# Patient Record
Sex: Male | Born: 1940 | Race: White | Hispanic: No | Smoking: Former smoker
Health system: Southern US, Community
[De-identification: ages and names within clinical notes are randomized; demographics above are authoritative.]

## PROBLEM LIST (undated history)

## (undated) DIAGNOSIS — M1990 Unspecified osteoarthritis, unspecified site: Secondary | ICD-10-CM

## (undated) DIAGNOSIS — Z85828 Personal history of other malignant neoplasm of skin: Secondary | ICD-10-CM

## (undated) DIAGNOSIS — N200 Calculus of kidney: Secondary | ICD-10-CM

## (undated) DIAGNOSIS — C801 Malignant (primary) neoplasm, unspecified: Secondary | ICD-10-CM

## (undated) DIAGNOSIS — R7303 Prediabetes: Secondary | ICD-10-CM

## (undated) DIAGNOSIS — K219 Gastro-esophageal reflux disease without esophagitis: Secondary | ICD-10-CM

## (undated) DIAGNOSIS — E782 Mixed hyperlipidemia: Secondary | ICD-10-CM

## (undated) DIAGNOSIS — E559 Vitamin D deficiency, unspecified: Secondary | ICD-10-CM

## (undated) DIAGNOSIS — H8110 Benign paroxysmal vertigo, unspecified ear: Secondary | ICD-10-CM

## (undated) DIAGNOSIS — E1121 Type 2 diabetes mellitus with diabetic nephropathy: Secondary | ICD-10-CM

## (undated) HISTORY — DX: Mixed hyperlipidemia: E78.2

## (undated) HISTORY — DX: Vitamin D deficiency, unspecified: E55.9

## (undated) HISTORY — DX: Unspecified osteoarthritis, unspecified site: M19.90

## (undated) HISTORY — DX: Gastro-esophageal reflux disease without esophagitis: K21.9

## (undated) HISTORY — DX: Benign paroxysmal vertigo, unspecified ear: H81.10

## (undated) HISTORY — DX: Personal history of other malignant neoplasm of skin: Z85.828

## (undated) HISTORY — DX: Prediabetes: R73.03

## (undated) HISTORY — DX: Type 2 diabetes mellitus with diabetic nephropathy: E11.21

---

## 1995-08-08 HISTORY — PX: TEMPORAL ARTERY BIOPSY / LIGATION: SUR132

## 2000-05-09 ENCOUNTER — Ambulatory Visit (HOSPITAL_COMMUNITY): Admission: RE | Admit: 2000-05-09 | Discharge: 2000-05-09 | Payer: Self-pay | Admitting: Vascular Surgery

## 2006-08-07 HISTORY — PX: PROSTATECTOMY: SHX69

## 2008-08-07 HISTORY — PX: TRANSURETHRAL RESECTION OF PROSTATE: SHX73

## 2010-08-07 HISTORY — PX: EYE SURGERY: SHX253

## 2013-08-07 DIAGNOSIS — C801 Malignant (primary) neoplasm, unspecified: Secondary | ICD-10-CM | POA: Insufficient documentation

## 2013-08-07 HISTORY — DX: Malignant (primary) neoplasm, unspecified: C80.1

## 2013-11-25 DIAGNOSIS — Z961 Presence of intraocular lens: Secondary | ICD-10-CM | POA: Diagnosis not present

## 2015-08-08 DIAGNOSIS — J0100 Acute maxillary sinusitis, unspecified: Secondary | ICD-10-CM | POA: Diagnosis not present

## 2015-08-08 DIAGNOSIS — R05 Cough: Secondary | ICD-10-CM | POA: Diagnosis not present

## 2015-08-31 DIAGNOSIS — J069 Acute upper respiratory infection, unspecified: Secondary | ICD-10-CM | POA: Diagnosis not present

## 2015-08-31 DIAGNOSIS — J209 Acute bronchitis, unspecified: Secondary | ICD-10-CM | POA: Diagnosis not present

## 2015-09-23 DIAGNOSIS — E782 Mixed hyperlipidemia: Secondary | ICD-10-CM | POA: Diagnosis not present

## 2015-09-23 DIAGNOSIS — N3941 Urge incontinence: Secondary | ICD-10-CM | POA: Diagnosis not present

## 2015-09-23 DIAGNOSIS — R5383 Other fatigue: Secondary | ICD-10-CM | POA: Diagnosis not present

## 2015-09-23 DIAGNOSIS — K219 Gastro-esophageal reflux disease without esophagitis: Secondary | ICD-10-CM | POA: Diagnosis not present

## 2015-10-08 DIAGNOSIS — J00 Acute nasopharyngitis [common cold]: Secondary | ICD-10-CM | POA: Diagnosis not present

## 2015-10-08 DIAGNOSIS — M791 Myalgia: Secondary | ICD-10-CM | POA: Diagnosis not present

## 2015-10-11 DIAGNOSIS — N3941 Urge incontinence: Secondary | ICD-10-CM | POA: Diagnosis not present

## 2015-10-11 DIAGNOSIS — J208 Acute bronchitis due to other specified organisms: Secondary | ICD-10-CM | POA: Diagnosis not present

## 2015-12-23 DIAGNOSIS — J208 Acute bronchitis due to other specified organisms: Secondary | ICD-10-CM | POA: Diagnosis not present

## 2015-12-25 DIAGNOSIS — N201 Calculus of ureter: Secondary | ICD-10-CM | POA: Diagnosis not present

## 2015-12-25 DIAGNOSIS — R319 Hematuria, unspecified: Secondary | ICD-10-CM | POA: Diagnosis not present

## 2015-12-25 DIAGNOSIS — R1030 Lower abdominal pain, unspecified: Secondary | ICD-10-CM | POA: Diagnosis not present

## 2015-12-25 DIAGNOSIS — I10 Essential (primary) hypertension: Secondary | ICD-10-CM | POA: Diagnosis not present

## 2015-12-29 DIAGNOSIS — N200 Calculus of kidney: Secondary | ICD-10-CM | POA: Diagnosis not present

## 2015-12-29 DIAGNOSIS — J208 Acute bronchitis due to other specified organisms: Secondary | ICD-10-CM | POA: Diagnosis not present

## 2016-01-06 ENCOUNTER — Other Ambulatory Visit: Payer: Self-pay | Admitting: Urology

## 2016-01-06 ENCOUNTER — Encounter (HOSPITAL_COMMUNITY): Payer: Self-pay

## 2016-01-06 DIAGNOSIS — N281 Cyst of kidney, acquired: Secondary | ICD-10-CM | POA: Diagnosis not present

## 2016-01-06 DIAGNOSIS — N202 Calculus of kidney with calculus of ureter: Secondary | ICD-10-CM | POA: Diagnosis not present

## 2016-01-09 DIAGNOSIS — S0502XA Injury of conjunctiva and corneal abrasion without foreign body, left eye, initial encounter: Secondary | ICD-10-CM | POA: Diagnosis not present

## 2016-01-10 ENCOUNTER — Encounter (HOSPITAL_COMMUNITY)
Admission: RE | Disposition: A | Discharge status: Home / Self Care | Payer: Self-pay | Source: Ambulatory Visit | Attending: Urology

## 2016-01-10 ENCOUNTER — Encounter (HOSPITAL_COMMUNITY): Payer: Self-pay

## 2016-01-10 ENCOUNTER — Ambulatory Visit (HOSPITAL_COMMUNITY)
Admission: RE | Admit: 2016-01-10 | Discharge: 2016-01-10 | Disposition: A | Discharge status: Home / Self Care | Payer: Medicare HMO | Source: Ambulatory Visit | Attending: Urology | Admitting: Urology

## 2016-01-10 ENCOUNTER — Ambulatory Visit (HOSPITAL_COMMUNITY): Payer: Medicare HMO

## 2016-01-10 DIAGNOSIS — M1990 Unspecified osteoarthritis, unspecified site: Secondary | ICD-10-CM | POA: Diagnosis not present

## 2016-01-10 DIAGNOSIS — Z8711 Personal history of peptic ulcer disease: Secondary | ICD-10-CM | POA: Diagnosis not present

## 2016-01-10 DIAGNOSIS — Z79899 Other long term (current) drug therapy: Secondary | ICD-10-CM | POA: Insufficient documentation

## 2016-01-10 DIAGNOSIS — N281 Cyst of kidney, acquired: Secondary | ICD-10-CM | POA: Insufficient documentation

## 2016-01-10 DIAGNOSIS — N201 Calculus of ureter: Secondary | ICD-10-CM

## 2016-01-10 DIAGNOSIS — Z8051 Family history of malignant neoplasm of kidney: Secondary | ICD-10-CM | POA: Diagnosis not present

## 2016-01-10 DIAGNOSIS — Z7982 Long term (current) use of aspirin: Secondary | ICD-10-CM | POA: Diagnosis not present

## 2016-01-10 DIAGNOSIS — Z87442 Personal history of urinary calculi: Secondary | ICD-10-CM | POA: Insufficient documentation

## 2016-01-10 DIAGNOSIS — Z79891 Long term (current) use of opiate analgesic: Secondary | ICD-10-CM | POA: Insufficient documentation

## 2016-01-10 DIAGNOSIS — N200 Calculus of kidney: Secondary | ICD-10-CM | POA: Diagnosis not present

## 2016-01-10 DIAGNOSIS — Z87891 Personal history of nicotine dependence: Secondary | ICD-10-CM | POA: Diagnosis not present

## 2016-01-10 DIAGNOSIS — Z01818 Encounter for other preprocedural examination: Secondary | ICD-10-CM | POA: Diagnosis not present

## 2016-01-10 HISTORY — DX: Calculus of kidney: N20.0

## 2016-01-10 HISTORY — DX: Malignant (primary) neoplasm, unspecified: C80.1

## 2016-01-10 SURGERY — LITHOTRIPSY, ESWL
Anesthesia: LOCAL | Laterality: Left

## 2016-01-10 MED ORDER — OXYCODONE HCL 10 MG PO TABS: 10.0000 mg | ORAL_TABLET | ORAL | Status: DC | PRN

## 2016-01-10 MED ORDER — DIAZEPAM 5 MG PO TABS
10.0000 mg | ORAL_TABLET | ORAL | Status: AC
  Administered 2016-01-10: 10 mg via ORAL
  Filled 2016-01-10: qty 2

## 2016-01-10 MED ORDER — CIPROFLOXACIN HCL 500 MG PO TABS
500.0000 mg | ORAL_TABLET | ORAL | Status: AC
  Administered 2016-01-10: 500 mg via ORAL
  Filled 2016-01-10: qty 1

## 2016-01-10 MED ORDER — SODIUM CHLORIDE 0.9 % IV SOLN
INTRAVENOUS | Status: DC
  Administered 2016-01-10: 08:00:00 via INTRAVENOUS

## 2016-01-10 MED ORDER — TAMSULOSIN HCL 0.4 MG PO CAPS: 0.4000 mg | ORAL_CAPSULE | ORAL | Status: DC

## 2016-01-10 MED ORDER — TAMSULOSIN HCL 0.4 MG PO CAPS
0.4000 mg | ORAL_CAPSULE | Freq: Once | ORAL | Status: AC
  Administered 2016-01-10: 0.4 mg via ORAL
  Filled 2016-01-10: qty 1

## 2016-01-10 MED ORDER — DIPHENHYDRAMINE HCL 25 MG PO CAPS
25.0000 mg | ORAL_CAPSULE | ORAL | Status: AC
  Administered 2016-01-10: 25 mg via ORAL
  Filled 2016-01-10: qty 1

## 2016-01-10 NOTE — H&P (Signed)
History of Present Illness [name] is a 75 yo WM who was sent in consultation from [facility] for a left proximal stone. He originally presented with gross hematuria about 12 days ago.  He then had lower abdominal and back pain with nausea. The pain was severe and he went to the ER at [facility] and had a CT that showed a 19m left proximal stone with mild hydro and a 749mLLP stone. He has an 8cm right renal cyst.  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  He is currently on flomax for the stone and was taken off of the tropsium and his incontinence is back. He had a TURP remotely.  He continues to have some nausea. and LLQ pain. He has no worsening of his urgency.  He has no prior history of stones.   Past Medical History Problems  1. History of arthritis (Z87.39) 2. History of gastric ulcer (Z87.19) 3. History of hypercholesterolemia (Z86.39) 4. History of renal calculi (Z(Z61.096 Surgical History Problems  1. History of Biopsy Temporal Artery 2. History of Prostate Surgery 3. History of Transurethral Resection Of Prostate (TURP)  Current Meds 1. Acidophilus TABS;  Therapy: (Recorded:01Jun2017) to Recorded 2. Amoxicillin TABS;  Therapy: (Recorded:01Jun2017) to Recorded 3. Aspirin 325 MG Oral Tablet;  Therapy: (Recorded:01Jun2017) to Recorded 4. Benzonatate 200 MG Oral Capsule;  Therapy: (Recorded:01Jun2017) to Recorded 5. Hydrocodone-Acetaminophen TABS;  Therapy: (Recorded:01Jun2017) to Recorded 6. Omeprazole 20 MG Oral Capsule Delayed Release;  Therapy: (Recorded:01Jun2017) to Recorded 7. Tamsulosin HCl - 0.4 MG Oral Capsule;  Therapy: (Recorded:01Jun2017) to Recorded  Allergies Medication  1. No Known Drug Allergies  Family History Problems  1. Family history of Brain tumor : Mother 2. 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  Genitourinary: urinary frequency, feelings of urinary urgency, nocturia, incontinence, hematuria and erectile dysfunction.  Gastrointestinal: nausea, diarrhea and constipation.  Constitutional: night sweats, feeling tired (fatigue) and recent weight loss.  ENT: sore throat and sinus problems.  Respiratory: cough.  Neurological: headache and dizziness.    Vitals Vital Signs   Height: 5 ft 11 in Weight: 230 lb  BMI Calculated: 32.08 BSA Calculated: 2.24 Blood Pressure: 159 / 79 Temperature: 98.4 F Heart Rate: 72  Physical Exam Constitutional: Well nourished and well developed . No acute distress.  ENT:. The ears and nose are normal in appearance.  Neck: The appearance of the neck is normal and no neck mass is present.  Pulmonary: No respiratory distress and normal respiratory rhythm and effort.  Cardiovascular: Heart rate and rhythm are normal . No peripheral edema.  Abdomen: The abdomen is obese. No masses are palpated. Mild tenderness in the LLQ is present. No CVA tenderness. No hernias are palpable. No hepatosplenomegaly noted.  Lymphatics: The posterior cervical and supraclavicular nodes are not enlarged or tender.  Skin: Normal skin turgor, no visible rash and no visible skin lesions.  Neuro/Psych:. Mood and affect are appropriate.    Results/Data  KUB for follow up of his left ureteral stone shows the stone adjacent to the L3-4 interspace. It measures about 2m x 416m There is a 1060mLP stone. He has degenerative lumbar changes but no gas or soft  tissue abnormalities.    [facility].  The following images/tracing/specimen were independently visualized:  CT and KUB films reviewed.  The following clinical lab reports were reviewed:  UA reviewed.  The following radiology reports were reviewed: CT films reviewed.    Assessment   He has non-progression of the left proximal stone with persistent pain and nausea.  He also has a 52m28mP stone and a large simple RLP cyst.   Plan   I discussed the treatment options including MET, ESWL, ureteroscopy and PCNL and have recommended an initial approach with ESWL since the stone has not moved but has a density that is amenable to ESWL. I reviewed the risks of bleeding, infection, injury possibly severe to the kidney or adjacent organs, failure of fragmentation, need for secondary procedures for obstructing fragments, cardiac arrhythmia and sedation complications.    He will be set up for next Monday if possible.

## 2016-01-10 NOTE — Discharge Instructions (Signed)
Lithotripsy, Care After °Refer to this sheet in the next few weeks. These instructions provide you with information on caring for yourself after your procedure. Your health care provider may also give you more specific instructions. Your treatment has been planned according to current medical practices, but problems sometimes occur. Call your health care provider if you have any problems or questions after your procedure. °WHAT TO EXPECT AFTER THE PROCEDURE  °· Your urine may have a red tinge for a few days after treatment. Blood loss is usually minimal. °· You may have soreness in the back or flank area. This usually goes away after a few days. The procedure can cause blotches or bruises on the back where the pressure wave enters the skin. These marks usually cause only minimal discomfort and should disappear in a short time. °· Stone fragments should begin to pass within 24 hours of treatment. However, a delayed passage is not unusual. °· You may have pain, discomfort, and feel sick to your stomach (nauseated) when the crushed fragments of stone are passed down the tube from the kidney to the bladder. Stone fragments can pass soon after the procedure and may last for up to 4-8 weeks. °· A small number of patients may have severe pain when stone fragments are not able to pass, which leads to an obstruction. °· If your stone is greater than 1 inch (2.5 cm) in diameter or if you have multiple stones that have a combined diameter greater than 1 inch (2.5 cm), you may require more than one treatment. °· If you had a stent placed prior to your procedure, you may experience some discomfort, especially during urination. You may experience the pain or discomfort in your flank or back, or you may experience a sharp pain or discomfort at the base of your penis or in your lower abdomen. The discomfort usually lasts only a few minutes after urinating. °HOME CARE INSTRUCTIONS  °· Rest at home until you feel your energy  improving. °· Only take over-the-counter or prescription medicines for pain, discomfort, or fever as directed by your health care provider. Depending on the type of lithotripsy, you may need to take antibiotics and anti-inflammatory medicines for a few days. °· Drink enough water and fluids to keep your urine clear or pale yellow. This helps "flush" your kidneys. It helps pass any remaining pieces of stone and prevents stones from coming back. °· Most people can resume daily activities within 1-2 days after standard lithotripsy. It can take longer to recover from laser and percutaneous lithotripsy. °· Strain all urine through the provided strainer. Keep all particulate matter and stones for your health care provider to see. The stone may be as small as a grain of salt. It is very important to use the strainer each and every time you pass your urine. Any stones that are found can be sent to a medical lab for examination. °· Visit your health care provider for a follow-up appointment in a few weeks. Your doctor may remove your stent if you have one. Your health care provider will also check to see whether stone particles still remain. °SEEK MEDICAL CARE IF:  °· Your pain is not relieved by medicine. °· You have a lasting nauseous feeling. °· You feel there is too much blood in the urine. °· You develop persistent problems with frequent or painful urination that does not at least partially improve after 2 days following the procedure. °· You have a congested cough. °· You feel   lightheaded. °· You develop a rash or any other signs that might suggest an allergic problem. °· You develop any reaction or side effects to your medicine(s). °SEEK IMMEDIATE MEDICAL CARE IF:  °· You experience severe back or flank pain or both. °· You see nothing but blood when you urinate. °· You cannot pass any urine at all. °· You have a fever or shaking chills. °· You develop shortness of breath, difficulty breathing, or chest pain. °· You  develop vomiting that will not stop after 6-8 hours. °· You have a fainting episode. °  °This information is not intended to replace advice given to you by your health care provider. Make sure you discuss any questions you have with your health care provider. °  °Document Released: 08/13/2007 Document Revised: 04/14/2015 Document Reviewed: 02/06/2013 °[employer]vier Interactive Patient Education ©2016 [employer]    ° ° ° °                                                                                                           Moderate Conscious Sedation, Adult, Care After °Refer to this sheet in the next few weeks. These instructions provide you with information on caring for yourself after your procedure. Your health care provider may also give you more specific instructions. Your treatment has been planned according to current medical practices, but problems sometimes occur. Call your health care provider if you have any problems or questions after your procedure. °WHAT TO EXPECT AFTER THE PROCEDURE  °After your procedure: °· You may feel sleepy, clumsy, and have poor balance for several hours. °· Vomiting may occur if you eat too soon after the procedure. °HOME CARE INSTRUCTIONS °· Do not participate in any activities where you could become injured for at least 24 hours. Do not: °¨ Drive. °¨ Swim. °¨ Ride a bicycle. °¨ Operate heavy machinery. °¨ Cook. °¨ Use power tools. °¨ Climb ladders. °¨ Work from a high place. °· Do not make important decisions or sign legal documents until you are improved. °· If you vomit, drink water, juice, or soup when you can drink without vomiting. Make sure you have little or no nausea before eating solid foods. °· Only take over-the-counter or prescription medicines for pain, discomfort, or fever as directed by your health care provider. °· Make sure you and your family fully understand everything about the medicines given to you, including what side effects may occur. °· You should  not drink alcohol, take sleeping pills, or take medicines that cause drowsiness for at least 24 hours. °· If you smoke, do not smoke without supervision. °· If you are feeling better, you may resume normal activities 24 hours after you were sedated. °· Keep all appointments with your health care provider. °SEEK MEDICAL CARE IF: °· Your skin is pale or bluish in color. °· You continue to feel nauseous or vomit. °· Your pain is getting worse and is not helped by medicine. °· You have bleeding or swelling. °· You are still sleepy or feeling clumsy after 24 hours. °SEEK

## 2016-01-10 NOTE — Op Note (Signed)
See [facility] OP note scanned into chart. Also because of the size, density, location and other factors that cannot be anticipated I feel this will likely be a staged procedure. This fact supersedes any indication in the scanned [facility] operative note to the contrary.  

## 2016-01-18 DIAGNOSIS — J208 Acute bronchitis due to other specified organisms: Secondary | ICD-10-CM | POA: Diagnosis not present

## 2016-02-01 DIAGNOSIS — R3915 Urgency of urination: Secondary | ICD-10-CM | POA: Diagnosis not present

## 2016-02-01 DIAGNOSIS — N200 Calculus of kidney: Secondary | ICD-10-CM | POA: Diagnosis not present

## 2016-02-02 DIAGNOSIS — J441 Chronic obstructive pulmonary disease with (acute) exacerbation: Secondary | ICD-10-CM | POA: Diagnosis not present

## 2016-02-02 DIAGNOSIS — J209 Acute bronchitis, unspecified: Secondary | ICD-10-CM | POA: Diagnosis not present

## 2016-02-02 DIAGNOSIS — R05 Cough: Secondary | ICD-10-CM | POA: Diagnosis not present

## 2016-02-02 DIAGNOSIS — N200 Calculus of kidney: Secondary | ICD-10-CM | POA: Diagnosis not present

## 2016-02-16 DIAGNOSIS — J410 Simple chronic bronchitis: Secondary | ICD-10-CM | POA: Diagnosis not present

## 2016-02-16 DIAGNOSIS — N3941 Urge incontinence: Secondary | ICD-10-CM | POA: Diagnosis not present

## 2016-03-05 DIAGNOSIS — S86111A Strain of other muscle(s) and tendon(s) of posterior muscle group at lower leg level, right leg, initial encounter: Secondary | ICD-10-CM | POA: Diagnosis not present

## 2016-03-05 DIAGNOSIS — M79662 Pain in left lower leg: Secondary | ICD-10-CM | POA: Diagnosis not present

## 2016-03-05 DIAGNOSIS — M79661 Pain in right lower leg: Secondary | ICD-10-CM | POA: Diagnosis not present

## 2016-03-05 DIAGNOSIS — X58XXXA Exposure to other specified factors, initial encounter: Secondary | ICD-10-CM | POA: Diagnosis not present

## 2016-03-05 DIAGNOSIS — M79606 Pain in leg, unspecified: Secondary | ICD-10-CM | POA: Diagnosis not present

## 2016-03-05 DIAGNOSIS — T148 Other injury of unspecified body region: Secondary | ICD-10-CM | POA: Diagnosis not present

## 2016-03-19 DIAGNOSIS — A881 Epidemic vertigo: Secondary | ICD-10-CM | POA: Diagnosis not present

## 2016-03-22 DIAGNOSIS — Z125 Encounter for screening for malignant neoplasm of prostate: Secondary | ICD-10-CM | POA: Diagnosis not present

## 2016-03-22 DIAGNOSIS — Z23 Encounter for immunization: Secondary | ICD-10-CM | POA: Diagnosis not present

## 2016-03-22 DIAGNOSIS — Z6835 Body mass index (BMI) 35.0-35.9, adult: Secondary | ICD-10-CM | POA: Diagnosis not present

## 2016-03-22 DIAGNOSIS — L02214 Cutaneous abscess of groin: Secondary | ICD-10-CM | POA: Diagnosis not present

## 2016-03-22 DIAGNOSIS — Z1211 Encounter for screening for malignant neoplasm of colon: Secondary | ICD-10-CM | POA: Diagnosis not present

## 2016-03-22 DIAGNOSIS — Z0001 Encounter for general adult medical examination with abnormal findings: Secondary | ICD-10-CM | POA: Diagnosis not present

## 2016-03-22 DIAGNOSIS — E559 Vitamin D deficiency, unspecified: Secondary | ICD-10-CM | POA: Diagnosis not present

## 2016-03-22 DIAGNOSIS — E782 Mixed hyperlipidemia: Secondary | ICD-10-CM | POA: Diagnosis not present

## 2016-03-29 DIAGNOSIS — Z0000 Encounter for general adult medical examination without abnormal findings: Secondary | ICD-10-CM | POA: Diagnosis not present

## 2016-03-29 DIAGNOSIS — K219 Gastro-esophageal reflux disease without esophagitis: Secondary | ICD-10-CM | POA: Diagnosis not present

## 2016-04-11 DIAGNOSIS — M1711 Unilateral primary osteoarthritis, right knee: Secondary | ICD-10-CM | POA: Diagnosis not present

## 2016-04-24 DIAGNOSIS — Z23 Encounter for immunization: Secondary | ICD-10-CM | POA: Diagnosis not present

## 2016-04-24 DIAGNOSIS — M25561 Pain in right knee: Secondary | ICD-10-CM | POA: Diagnosis not present

## 2016-04-25 DIAGNOSIS — M179 Osteoarthritis of knee, unspecified: Secondary | ICD-10-CM | POA: Diagnosis not present

## 2016-04-25 DIAGNOSIS — M25561 Pain in right knee: Secondary | ICD-10-CM | POA: Diagnosis not present

## 2016-04-25 DIAGNOSIS — M170 Bilateral primary osteoarthritis of knee: Secondary | ICD-10-CM | POA: Diagnosis not present

## 2016-05-08 DIAGNOSIS — M25561 Pain in right knee: Secondary | ICD-10-CM | POA: Diagnosis not present

## 2016-05-12 DIAGNOSIS — M1711 Unilateral primary osteoarthritis, right knee: Secondary | ICD-10-CM | POA: Diagnosis not present

## 2016-07-06 DIAGNOSIS — J018 Other acute sinusitis: Secondary | ICD-10-CM | POA: Diagnosis not present

## 2016-07-13 DIAGNOSIS — E559 Vitamin D deficiency, unspecified: Secondary | ICD-10-CM | POA: Diagnosis not present

## 2016-07-13 DIAGNOSIS — R0602 Shortness of breath: Secondary | ICD-10-CM | POA: Diagnosis not present

## 2016-07-13 DIAGNOSIS — K219 Gastro-esophageal reflux disease without esophagitis: Secondary | ICD-10-CM | POA: Diagnosis not present

## 2016-07-13 DIAGNOSIS — J410 Simple chronic bronchitis: Secondary | ICD-10-CM | POA: Diagnosis not present

## 2016-07-13 DIAGNOSIS — E782 Mixed hyperlipidemia: Secondary | ICD-10-CM | POA: Diagnosis not present

## 2016-07-13 DIAGNOSIS — J301 Allergic rhinitis due to pollen: Secondary | ICD-10-CM | POA: Diagnosis not present

## 2016-07-18 DIAGNOSIS — J018 Other acute sinusitis: Secondary | ICD-10-CM | POA: Diagnosis not present

## 2016-07-18 DIAGNOSIS — N200 Calculus of kidney: Secondary | ICD-10-CM | POA: Diagnosis not present

## 2016-08-03 DIAGNOSIS — J410 Simple chronic bronchitis: Secondary | ICD-10-CM | POA: Diagnosis not present

## 2016-10-05 HISTORY — PX: REPLACEMENT TOTAL KNEE: SUR1224

## 2016-10-24 DIAGNOSIS — K219 Gastro-esophageal reflux disease without esophagitis: Secondary | ICD-10-CM | POA: Diagnosis not present

## 2016-10-24 DIAGNOSIS — J301 Allergic rhinitis due to pollen: Secondary | ICD-10-CM | POA: Diagnosis not present

## 2016-10-24 DIAGNOSIS — E782 Mixed hyperlipidemia: Secondary | ICD-10-CM | POA: Diagnosis not present

## 2016-10-24 DIAGNOSIS — J410 Simple chronic bronchitis: Secondary | ICD-10-CM | POA: Diagnosis not present

## 2016-12-14 DIAGNOSIS — J441 Chronic obstructive pulmonary disease with (acute) exacerbation: Secondary | ICD-10-CM | POA: Diagnosis not present

## 2017-01-26 DIAGNOSIS — K219 Gastro-esophageal reflux disease without esophagitis: Secondary | ICD-10-CM | POA: Diagnosis not present

## 2017-01-26 DIAGNOSIS — M79671 Pain in right foot: Secondary | ICD-10-CM | POA: Diagnosis not present

## 2017-01-26 DIAGNOSIS — Z6838 Body mass index (BMI) 38.0-38.9, adult: Secondary | ICD-10-CM | POA: Diagnosis not present

## 2017-01-26 DIAGNOSIS — R600 Localized edema: Secondary | ICD-10-CM | POA: Diagnosis not present

## 2017-01-26 DIAGNOSIS — E782 Mixed hyperlipidemia: Secondary | ICD-10-CM | POA: Diagnosis not present

## 2017-01-26 DIAGNOSIS — J410 Simple chronic bronchitis: Secondary | ICD-10-CM | POA: Diagnosis not present

## 2017-01-26 DIAGNOSIS — M79604 Pain in right leg: Secondary | ICD-10-CM | POA: Diagnosis not present

## 2017-01-26 DIAGNOSIS — M19071 Primary osteoarthritis, right ankle and foot: Secondary | ICD-10-CM | POA: Diagnosis not present

## 2017-02-06 DIAGNOSIS — J441 Chronic obstructive pulmonary disease with (acute) exacerbation: Secondary | ICD-10-CM | POA: Diagnosis not present

## 2017-03-29 DIAGNOSIS — Z0000 Encounter for general adult medical examination without abnormal findings: Secondary | ICD-10-CM | POA: Diagnosis not present

## 2017-03-29 DIAGNOSIS — K219 Gastro-esophageal reflux disease without esophagitis: Secondary | ICD-10-CM | POA: Diagnosis not present

## 2017-03-29 DIAGNOSIS — M25473 Effusion, unspecified ankle: Secondary | ICD-10-CM | POA: Diagnosis not present

## 2017-03-29 DIAGNOSIS — M25461 Effusion, right knee: Secondary | ICD-10-CM | POA: Diagnosis not present

## 2017-03-29 DIAGNOSIS — E785 Hyperlipidemia, unspecified: Secondary | ICD-10-CM | POA: Diagnosis not present

## 2017-03-29 DIAGNOSIS — L02818 Cutaneous abscess of other sites: Secondary | ICD-10-CM | POA: Diagnosis not present

## 2017-03-29 DIAGNOSIS — J449 Chronic obstructive pulmonary disease, unspecified: Secondary | ICD-10-CM | POA: Diagnosis not present

## 2017-03-29 DIAGNOSIS — H9113 Presbycusis, bilateral: Secondary | ICD-10-CM | POA: Diagnosis not present

## 2017-03-29 DIAGNOSIS — E669 Obesity, unspecified: Secondary | ICD-10-CM | POA: Diagnosis not present

## 2017-03-29 DIAGNOSIS — G5791 Unspecified mononeuropathy of right lower limb: Secondary | ICD-10-CM | POA: Diagnosis not present

## 2017-04-26 DIAGNOSIS — E291 Testicular hypofunction: Secondary | ICD-10-CM | POA: Diagnosis not present

## 2017-04-26 DIAGNOSIS — R635 Abnormal weight gain: Secondary | ICD-10-CM | POA: Diagnosis not present

## 2017-04-27 DIAGNOSIS — R5383 Other fatigue: Secondary | ICD-10-CM | POA: Diagnosis not present

## 2017-04-27 DIAGNOSIS — E8881 Metabolic syndrome: Secondary | ICD-10-CM | POA: Diagnosis not present

## 2017-04-27 DIAGNOSIS — R69 Illness, unspecified: Secondary | ICD-10-CM | POA: Diagnosis not present

## 2017-04-27 DIAGNOSIS — Z6837 Body mass index (BMI) 37.0-37.9, adult: Secondary | ICD-10-CM | POA: Diagnosis not present

## 2017-04-27 DIAGNOSIS — M2550 Pain in unspecified joint: Secondary | ICD-10-CM | POA: Diagnosis not present

## 2017-04-27 DIAGNOSIS — G479 Sleep disorder, unspecified: Secondary | ICD-10-CM | POA: Diagnosis not present

## 2017-05-01 DIAGNOSIS — E8881 Metabolic syndrome: Secondary | ICD-10-CM | POA: Diagnosis not present

## 2017-05-01 DIAGNOSIS — R69 Illness, unspecified: Secondary | ICD-10-CM | POA: Diagnosis not present

## 2017-05-01 DIAGNOSIS — Z6836 Body mass index (BMI) 36.0-36.9, adult: Secondary | ICD-10-CM | POA: Diagnosis not present

## 2017-05-08 DIAGNOSIS — Z6835 Body mass index (BMI) 35.0-35.9, adult: Secondary | ICD-10-CM | POA: Diagnosis not present

## 2017-05-08 DIAGNOSIS — E8881 Metabolic syndrome: Secondary | ICD-10-CM | POA: Diagnosis not present

## 2017-05-14 DIAGNOSIS — E8881 Metabolic syndrome: Secondary | ICD-10-CM | POA: Diagnosis not present

## 2017-05-14 DIAGNOSIS — Z6836 Body mass index (BMI) 36.0-36.9, adult: Secondary | ICD-10-CM | POA: Diagnosis not present

## 2017-05-16 DIAGNOSIS — G608 Other hereditary and idiopathic neuropathies: Secondary | ICD-10-CM | POA: Diagnosis not present

## 2017-05-16 DIAGNOSIS — Z125 Encounter for screening for malignant neoplasm of prostate: Secondary | ICD-10-CM | POA: Diagnosis not present

## 2017-05-16 DIAGNOSIS — Z6838 Body mass index (BMI) 38.0-38.9, adult: Secondary | ICD-10-CM | POA: Diagnosis not present

## 2017-05-16 DIAGNOSIS — E291 Testicular hypofunction: Secondary | ICD-10-CM | POA: Diagnosis not present

## 2017-05-16 DIAGNOSIS — Z23 Encounter for immunization: Secondary | ICD-10-CM | POA: Diagnosis not present

## 2017-05-16 DIAGNOSIS — M1711 Unilateral primary osteoarthritis, right knee: Secondary | ICD-10-CM | POA: Diagnosis not present

## 2017-05-16 DIAGNOSIS — J410 Simple chronic bronchitis: Secondary | ICD-10-CM | POA: Diagnosis not present

## 2017-05-16 DIAGNOSIS — K219 Gastro-esophageal reflux disease without esophagitis: Secondary | ICD-10-CM | POA: Diagnosis not present

## 2017-05-16 DIAGNOSIS — E782 Mixed hyperlipidemia: Secondary | ICD-10-CM | POA: Diagnosis not present

## 2017-05-25 DIAGNOSIS — J441 Chronic obstructive pulmonary disease with (acute) exacerbation: Secondary | ICD-10-CM | POA: Diagnosis not present

## 2017-05-25 DIAGNOSIS — Z6834 Body mass index (BMI) 34.0-34.9, adult: Secondary | ICD-10-CM | POA: Diagnosis not present

## 2017-05-25 DIAGNOSIS — E291 Testicular hypofunction: Secondary | ICD-10-CM | POA: Diagnosis not present

## 2017-05-25 DIAGNOSIS — E8881 Metabolic syndrome: Secondary | ICD-10-CM | POA: Diagnosis not present

## 2017-05-29 DIAGNOSIS — E291 Testicular hypofunction: Secondary | ICD-10-CM | POA: Diagnosis not present

## 2017-06-01 DIAGNOSIS — Z6833 Body mass index (BMI) 33.0-33.9, adult: Secondary | ICD-10-CM | POA: Diagnosis not present

## 2017-06-01 DIAGNOSIS — E8881 Metabolic syndrome: Secondary | ICD-10-CM | POA: Diagnosis not present

## 2017-06-12 DIAGNOSIS — E291 Testicular hypofunction: Secondary | ICD-10-CM | POA: Diagnosis not present

## 2017-06-15 DIAGNOSIS — Z6833 Body mass index (BMI) 33.0-33.9, adult: Secondary | ICD-10-CM | POA: Diagnosis not present

## 2017-06-15 DIAGNOSIS — E8881 Metabolic syndrome: Secondary | ICD-10-CM | POA: Diagnosis not present

## 2017-06-22 DIAGNOSIS — E8881 Metabolic syndrome: Secondary | ICD-10-CM | POA: Diagnosis not present

## 2017-06-22 DIAGNOSIS — Z6833 Body mass index (BMI) 33.0-33.9, adult: Secondary | ICD-10-CM | POA: Diagnosis not present

## 2017-07-02 DIAGNOSIS — E291 Testicular hypofunction: Secondary | ICD-10-CM | POA: Diagnosis not present

## 2017-07-06 DIAGNOSIS — Z6832 Body mass index (BMI) 32.0-32.9, adult: Secondary | ICD-10-CM | POA: Diagnosis not present

## 2017-07-06 DIAGNOSIS — E8881 Metabolic syndrome: Secondary | ICD-10-CM | POA: Diagnosis not present

## 2017-07-12 DIAGNOSIS — Z6832 Body mass index (BMI) 32.0-32.9, adult: Secondary | ICD-10-CM | POA: Diagnosis not present

## 2017-07-12 DIAGNOSIS — E8881 Metabolic syndrome: Secondary | ICD-10-CM | POA: Diagnosis not present

## 2017-07-12 DIAGNOSIS — R635 Abnormal weight gain: Secondary | ICD-10-CM | POA: Diagnosis not present

## 2017-07-20 DIAGNOSIS — E8881 Metabolic syndrome: Secondary | ICD-10-CM | POA: Diagnosis not present

## 2017-07-20 DIAGNOSIS — Z6832 Body mass index (BMI) 32.0-32.9, adult: Secondary | ICD-10-CM | POA: Diagnosis not present

## 2017-07-20 DIAGNOSIS — E291 Testicular hypofunction: Secondary | ICD-10-CM | POA: Diagnosis not present

## 2017-08-03 DIAGNOSIS — Z6832 Body mass index (BMI) 32.0-32.9, adult: Secondary | ICD-10-CM | POA: Diagnosis not present

## 2017-08-03 DIAGNOSIS — E291 Testicular hypofunction: Secondary | ICD-10-CM | POA: Diagnosis not present

## 2017-08-03 DIAGNOSIS — E8881 Metabolic syndrome: Secondary | ICD-10-CM | POA: Diagnosis not present

## 2017-08-06 DIAGNOSIS — N281 Cyst of kidney, acquired: Secondary | ICD-10-CM | POA: Diagnosis not present

## 2017-08-06 DIAGNOSIS — N200 Calculus of kidney: Secondary | ICD-10-CM | POA: Diagnosis not present

## 2017-08-06 DIAGNOSIS — N5201 Erectile dysfunction due to arterial insufficiency: Secondary | ICD-10-CM | POA: Diagnosis not present

## 2017-08-17 DIAGNOSIS — Z6832 Body mass index (BMI) 32.0-32.9, adult: Secondary | ICD-10-CM | POA: Diagnosis not present

## 2017-08-17 DIAGNOSIS — E8881 Metabolic syndrome: Secondary | ICD-10-CM | POA: Diagnosis not present

## 2017-08-21 DIAGNOSIS — J410 Simple chronic bronchitis: Secondary | ICD-10-CM | POA: Diagnosis not present

## 2017-08-21 DIAGNOSIS — E298 Other testicular dysfunction: Secondary | ICD-10-CM | POA: Diagnosis not present

## 2017-08-21 DIAGNOSIS — Z6835 Body mass index (BMI) 35.0-35.9, adult: Secondary | ICD-10-CM | POA: Diagnosis not present

## 2017-08-21 DIAGNOSIS — E782 Mixed hyperlipidemia: Secondary | ICD-10-CM | POA: Diagnosis not present

## 2017-08-21 DIAGNOSIS — K219 Gastro-esophageal reflux disease without esophagitis: Secondary | ICD-10-CM | POA: Diagnosis not present

## 2017-09-13 DIAGNOSIS — E291 Testicular hypofunction: Secondary | ICD-10-CM | POA: Diagnosis not present

## 2017-09-16 DIAGNOSIS — R062 Wheezing: Secondary | ICD-10-CM | POA: Diagnosis not present

## 2017-09-16 DIAGNOSIS — J209 Acute bronchitis, unspecified: Secondary | ICD-10-CM | POA: Diagnosis not present

## 2017-09-16 DIAGNOSIS — R05 Cough: Secondary | ICD-10-CM | POA: Diagnosis not present

## 2017-09-28 DIAGNOSIS — E298 Other testicular dysfunction: Secondary | ICD-10-CM | POA: Diagnosis not present

## 2017-09-28 DIAGNOSIS — J208 Acute bronchitis due to other specified organisms: Secondary | ICD-10-CM | POA: Diagnosis not present

## 2017-10-29 DIAGNOSIS — E291 Testicular hypofunction: Secondary | ICD-10-CM | POA: Diagnosis not present

## 2017-11-12 DIAGNOSIS — J441 Chronic obstructive pulmonary disease with (acute) exacerbation: Secondary | ICD-10-CM | POA: Diagnosis not present

## 2017-11-26 DIAGNOSIS — E291 Testicular hypofunction: Secondary | ICD-10-CM | POA: Diagnosis not present

## 2017-12-03 DIAGNOSIS — J410 Simple chronic bronchitis: Secondary | ICD-10-CM | POA: Diagnosis not present

## 2017-12-03 DIAGNOSIS — Z6833 Body mass index (BMI) 33.0-33.9, adult: Secondary | ICD-10-CM | POA: Diagnosis not present

## 2017-12-03 DIAGNOSIS — K219 Gastro-esophageal reflux disease without esophagitis: Secondary | ICD-10-CM | POA: Diagnosis not present

## 2017-12-03 DIAGNOSIS — E782 Mixed hyperlipidemia: Secondary | ICD-10-CM | POA: Diagnosis not present

## 2017-12-03 DIAGNOSIS — E298 Other testicular dysfunction: Secondary | ICD-10-CM | POA: Diagnosis not present

## 2017-12-19 DIAGNOSIS — E291 Testicular hypofunction: Secondary | ICD-10-CM | POA: Diagnosis not present

## 2017-12-26 DIAGNOSIS — R69 Illness, unspecified: Secondary | ICD-10-CM | POA: Diagnosis not present

## 2017-12-26 DIAGNOSIS — J449 Chronic obstructive pulmonary disease, unspecified: Secondary | ICD-10-CM | POA: Diagnosis not present

## 2017-12-26 DIAGNOSIS — K219 Gastro-esophageal reflux disease without esophagitis: Secondary | ICD-10-CM | POA: Diagnosis not present

## 2017-12-26 DIAGNOSIS — G8929 Other chronic pain: Secondary | ICD-10-CM | POA: Diagnosis not present

## 2017-12-26 DIAGNOSIS — Z825 Family history of asthma and other chronic lower respiratory diseases: Secondary | ICD-10-CM | POA: Diagnosis not present

## 2017-12-26 DIAGNOSIS — Z809 Family history of malignant neoplasm, unspecified: Secondary | ICD-10-CM | POA: Diagnosis not present

## 2017-12-26 DIAGNOSIS — Z8249 Family history of ischemic heart disease and other diseases of the circulatory system: Secondary | ICD-10-CM | POA: Diagnosis not present

## 2017-12-26 DIAGNOSIS — Z7951 Long term (current) use of inhaled steroids: Secondary | ICD-10-CM | POA: Diagnosis not present

## 2017-12-26 DIAGNOSIS — E669 Obesity, unspecified: Secondary | ICD-10-CM | POA: Diagnosis not present

## 2017-12-26 DIAGNOSIS — N3281 Overactive bladder: Secondary | ICD-10-CM | POA: Diagnosis not present

## 2018-01-01 DIAGNOSIS — E291 Testicular hypofunction: Secondary | ICD-10-CM | POA: Diagnosis not present

## 2018-02-15 DIAGNOSIS — E291 Testicular hypofunction: Secondary | ICD-10-CM | POA: Diagnosis not present

## 2018-03-06 DIAGNOSIS — M2550 Pain in unspecified joint: Secondary | ICD-10-CM | POA: Diagnosis not present

## 2018-03-06 DIAGNOSIS — R5383 Other fatigue: Secondary | ICD-10-CM | POA: Diagnosis not present

## 2018-03-06 DIAGNOSIS — E291 Testicular hypofunction: Secondary | ICD-10-CM | POA: Diagnosis not present

## 2018-03-06 DIAGNOSIS — R69 Illness, unspecified: Secondary | ICD-10-CM | POA: Diagnosis not present

## 2018-03-06 DIAGNOSIS — G479 Sleep disorder, unspecified: Secondary | ICD-10-CM | POA: Diagnosis not present

## 2018-03-06 DIAGNOSIS — N259 Disorder resulting from impaired renal tubular function, unspecified: Secondary | ICD-10-CM | POA: Diagnosis not present

## 2018-03-06 DIAGNOSIS — N529 Male erectile dysfunction, unspecified: Secondary | ICD-10-CM | POA: Diagnosis not present

## 2018-03-08 DIAGNOSIS — E291 Testicular hypofunction: Secondary | ICD-10-CM | POA: Diagnosis not present

## 2018-03-08 DIAGNOSIS — N529 Male erectile dysfunction, unspecified: Secondary | ICD-10-CM | POA: Diagnosis not present

## 2018-03-08 DIAGNOSIS — R5383 Other fatigue: Secondary | ICD-10-CM | POA: Diagnosis not present

## 2018-03-14 DIAGNOSIS — E291 Testicular hypofunction: Secondary | ICD-10-CM | POA: Diagnosis not present

## 2018-03-22 DIAGNOSIS — E291 Testicular hypofunction: Secondary | ICD-10-CM | POA: Diagnosis not present

## 2018-04-01 DIAGNOSIS — E291 Testicular hypofunction: Secondary | ICD-10-CM | POA: Diagnosis not present

## 2018-04-12 DIAGNOSIS — N529 Male erectile dysfunction, unspecified: Secondary | ICD-10-CM | POA: Diagnosis not present

## 2018-04-12 DIAGNOSIS — R69 Illness, unspecified: Secondary | ICD-10-CM | POA: Diagnosis not present

## 2018-04-12 DIAGNOSIS — E291 Testicular hypofunction: Secondary | ICD-10-CM | POA: Diagnosis not present

## 2018-04-12 DIAGNOSIS — R3915 Urgency of urination: Secondary | ICD-10-CM | POA: Diagnosis not present

## 2018-04-17 DIAGNOSIS — R69 Illness, unspecified: Secondary | ICD-10-CM | POA: Diagnosis not present

## 2018-04-19 DIAGNOSIS — E291 Testicular hypofunction: Secondary | ICD-10-CM | POA: Diagnosis not present

## 2018-04-26 DIAGNOSIS — E291 Testicular hypofunction: Secondary | ICD-10-CM | POA: Diagnosis not present

## 2018-04-29 DIAGNOSIS — Z0000 Encounter for general adult medical examination without abnormal findings: Secondary | ICD-10-CM | POA: Diagnosis not present

## 2018-04-29 DIAGNOSIS — Z6835 Body mass index (BMI) 35.0-35.9, adult: Secondary | ICD-10-CM | POA: Diagnosis not present

## 2018-05-03 DIAGNOSIS — E291 Testicular hypofunction: Secondary | ICD-10-CM | POA: Diagnosis not present

## 2018-05-10 DIAGNOSIS — E291 Testicular hypofunction: Secondary | ICD-10-CM | POA: Diagnosis not present

## 2018-05-15 DIAGNOSIS — E298 Other testicular dysfunction: Secondary | ICD-10-CM | POA: Diagnosis not present

## 2018-05-15 DIAGNOSIS — R7301 Impaired fasting glucose: Secondary | ICD-10-CM | POA: Diagnosis not present

## 2018-05-15 DIAGNOSIS — J410 Simple chronic bronchitis: Secondary | ICD-10-CM | POA: Diagnosis not present

## 2018-05-15 DIAGNOSIS — Z6833 Body mass index (BMI) 33.0-33.9, adult: Secondary | ICD-10-CM | POA: Diagnosis not present

## 2018-05-15 DIAGNOSIS — E782 Mixed hyperlipidemia: Secondary | ICD-10-CM | POA: Diagnosis not present

## 2018-05-15 DIAGNOSIS — K219 Gastro-esophageal reflux disease without esophagitis: Secondary | ICD-10-CM | POA: Diagnosis not present

## 2018-05-17 DIAGNOSIS — E291 Testicular hypofunction: Secondary | ICD-10-CM | POA: Diagnosis not present

## 2018-05-24 DIAGNOSIS — E291 Testicular hypofunction: Secondary | ICD-10-CM | POA: Diagnosis not present

## 2018-06-07 DIAGNOSIS — E291 Testicular hypofunction: Secondary | ICD-10-CM | POA: Diagnosis not present

## 2018-06-11 DIAGNOSIS — J441 Chronic obstructive pulmonary disease with (acute) exacerbation: Secondary | ICD-10-CM | POA: Diagnosis not present

## 2018-06-11 DIAGNOSIS — J018 Other acute sinusitis: Secondary | ICD-10-CM | POA: Diagnosis not present

## 2018-06-14 DIAGNOSIS — E291 Testicular hypofunction: Secondary | ICD-10-CM | POA: Diagnosis not present

## 2018-06-21 DIAGNOSIS — E291 Testicular hypofunction: Secondary | ICD-10-CM | POA: Diagnosis not present

## 2018-06-28 DIAGNOSIS — E291 Testicular hypofunction: Secondary | ICD-10-CM | POA: Diagnosis not present

## 2018-07-15 DIAGNOSIS — E291 Testicular hypofunction: Secondary | ICD-10-CM | POA: Diagnosis not present

## 2018-07-15 DIAGNOSIS — R69 Illness, unspecified: Secondary | ICD-10-CM | POA: Diagnosis not present

## 2018-07-15 DIAGNOSIS — M2550 Pain in unspecified joint: Secondary | ICD-10-CM | POA: Diagnosis not present

## 2018-07-15 DIAGNOSIS — N529 Male erectile dysfunction, unspecified: Secondary | ICD-10-CM | POA: Diagnosis not present

## 2018-07-16 DIAGNOSIS — J060 Acute laryngopharyngitis: Secondary | ICD-10-CM | POA: Diagnosis not present

## 2018-07-22 DIAGNOSIS — R310 Gross hematuria: Secondary | ICD-10-CM | POA: Diagnosis not present

## 2018-07-22 DIAGNOSIS — J060 Acute laryngopharyngitis: Secondary | ICD-10-CM | POA: Diagnosis not present

## 2018-07-23 DIAGNOSIS — N200 Calculus of kidney: Secondary | ICD-10-CM | POA: Diagnosis not present

## 2018-07-23 DIAGNOSIS — R310 Gross hematuria: Secondary | ICD-10-CM | POA: Diagnosis not present

## 2018-07-23 DIAGNOSIS — N5201 Erectile dysfunction due to arterial insufficiency: Secondary | ICD-10-CM | POA: Diagnosis not present

## 2018-07-26 DIAGNOSIS — E291 Testicular hypofunction: Secondary | ICD-10-CM | POA: Diagnosis not present

## 2018-07-26 DIAGNOSIS — R69 Illness, unspecified: Secondary | ICD-10-CM | POA: Diagnosis not present

## 2018-07-26 DIAGNOSIS — M2550 Pain in unspecified joint: Secondary | ICD-10-CM | POA: Diagnosis not present

## 2018-07-26 DIAGNOSIS — N529 Male erectile dysfunction, unspecified: Secondary | ICD-10-CM | POA: Diagnosis not present

## 2018-07-26 DIAGNOSIS — G479 Sleep disorder, unspecified: Secondary | ICD-10-CM | POA: Diagnosis not present

## 2018-07-29 DIAGNOSIS — K5730 Diverticulosis of large intestine without perforation or abscess without bleeding: Secondary | ICD-10-CM | POA: Diagnosis not present

## 2018-07-29 DIAGNOSIS — R310 Gross hematuria: Secondary | ICD-10-CM | POA: Diagnosis not present

## 2018-08-01 DIAGNOSIS — J441 Chronic obstructive pulmonary disease with (acute) exacerbation: Secondary | ICD-10-CM | POA: Diagnosis not present

## 2018-08-01 DIAGNOSIS — R05 Cough: Secondary | ICD-10-CM | POA: Diagnosis not present

## 2018-08-01 DIAGNOSIS — J028 Acute pharyngitis due to other specified organisms: Secondary | ICD-10-CM | POA: Diagnosis not present

## 2018-08-02 DIAGNOSIS — E291 Testicular hypofunction: Secondary | ICD-10-CM | POA: Diagnosis not present

## 2018-08-08 DIAGNOSIS — N528 Other male erectile dysfunction: Secondary | ICD-10-CM | POA: Diagnosis not present

## 2018-08-08 DIAGNOSIS — R310 Gross hematuria: Secondary | ICD-10-CM | POA: Diagnosis not present

## 2018-08-09 DIAGNOSIS — E291 Testicular hypofunction: Secondary | ICD-10-CM | POA: Diagnosis not present

## 2018-08-09 DIAGNOSIS — N529 Male erectile dysfunction, unspecified: Secondary | ICD-10-CM | POA: Diagnosis not present

## 2018-08-16 DIAGNOSIS — E291 Testicular hypofunction: Secondary | ICD-10-CM | POA: Diagnosis not present

## 2018-08-19 DIAGNOSIS — E668 Other obesity: Secondary | ICD-10-CM | POA: Diagnosis not present

## 2018-08-19 DIAGNOSIS — J410 Simple chronic bronchitis: Secondary | ICD-10-CM | POA: Diagnosis not present

## 2018-08-19 DIAGNOSIS — K219 Gastro-esophageal reflux disease without esophagitis: Secondary | ICD-10-CM | POA: Diagnosis not present

## 2018-08-19 DIAGNOSIS — J3089 Other allergic rhinitis: Secondary | ICD-10-CM | POA: Diagnosis not present

## 2018-08-19 DIAGNOSIS — E782 Mixed hyperlipidemia: Secondary | ICD-10-CM | POA: Diagnosis not present

## 2018-08-19 DIAGNOSIS — R7301 Impaired fasting glucose: Secondary | ICD-10-CM | POA: Diagnosis not present

## 2018-08-19 DIAGNOSIS — Z6834 Body mass index (BMI) 34.0-34.9, adult: Secondary | ICD-10-CM | POA: Diagnosis not present

## 2018-08-20 DIAGNOSIS — Z0100 Encounter for examination of eyes and vision without abnormal findings: Secondary | ICD-10-CM | POA: Diagnosis not present

## 2018-08-20 DIAGNOSIS — Z961 Presence of intraocular lens: Secondary | ICD-10-CM | POA: Diagnosis not present

## 2018-08-22 DIAGNOSIS — E291 Testicular hypofunction: Secondary | ICD-10-CM | POA: Diagnosis not present

## 2018-08-30 DIAGNOSIS — E291 Testicular hypofunction: Secondary | ICD-10-CM | POA: Diagnosis not present

## 2018-09-06 DIAGNOSIS — E291 Testicular hypofunction: Secondary | ICD-10-CM | POA: Diagnosis not present

## 2018-09-13 DIAGNOSIS — E291 Testicular hypofunction: Secondary | ICD-10-CM | POA: Diagnosis not present

## 2018-09-19 DIAGNOSIS — E291 Testicular hypofunction: Secondary | ICD-10-CM | POA: Diagnosis not present

## 2018-10-04 DIAGNOSIS — E291 Testicular hypofunction: Secondary | ICD-10-CM | POA: Diagnosis not present

## 2018-10-11 DIAGNOSIS — E291 Testicular hypofunction: Secondary | ICD-10-CM | POA: Diagnosis not present

## 2018-10-11 DIAGNOSIS — N529 Male erectile dysfunction, unspecified: Secondary | ICD-10-CM | POA: Diagnosis not present

## 2018-10-18 DIAGNOSIS — E291 Testicular hypofunction: Secondary | ICD-10-CM | POA: Diagnosis not present

## 2018-11-04 DIAGNOSIS — E291 Testicular hypofunction: Secondary | ICD-10-CM | POA: Diagnosis not present

## 2018-11-15 DIAGNOSIS — E291 Testicular hypofunction: Secondary | ICD-10-CM | POA: Diagnosis not present

## 2018-11-20 DIAGNOSIS — M5031 Other cervical disc degeneration,  high cervical region: Secondary | ICD-10-CM | POA: Diagnosis not present

## 2018-11-20 DIAGNOSIS — M9902 Segmental and somatic dysfunction of thoracic region: Secondary | ICD-10-CM | POA: Diagnosis not present

## 2018-11-20 DIAGNOSIS — M4312 Spondylolisthesis, cervical region: Secondary | ICD-10-CM | POA: Diagnosis not present

## 2018-11-20 DIAGNOSIS — M50322 Other cervical disc degeneration at C5-C6 level: Secondary | ICD-10-CM | POA: Diagnosis not present

## 2018-11-20 DIAGNOSIS — M50323 Other cervical disc degeneration at C6-C7 level: Secondary | ICD-10-CM | POA: Diagnosis not present

## 2018-11-20 DIAGNOSIS — M531 Cervicobrachial syndrome: Secondary | ICD-10-CM | POA: Diagnosis not present

## 2018-11-20 DIAGNOSIS — M9903 Segmental and somatic dysfunction of lumbar region: Secondary | ICD-10-CM | POA: Diagnosis not present

## 2018-11-20 DIAGNOSIS — M50321 Other cervical disc degeneration at C4-C5 level: Secondary | ICD-10-CM | POA: Diagnosis not present

## 2018-11-21 DIAGNOSIS — J410 Simple chronic bronchitis: Secondary | ICD-10-CM | POA: Diagnosis not present

## 2018-11-21 DIAGNOSIS — R7301 Impaired fasting glucose: Secondary | ICD-10-CM | POA: Diagnosis not present

## 2018-11-21 DIAGNOSIS — K219 Gastro-esophageal reflux disease without esophagitis: Secondary | ICD-10-CM | POA: Diagnosis not present

## 2018-11-21 DIAGNOSIS — Z6834 Body mass index (BMI) 34.0-34.9, adult: Secondary | ICD-10-CM | POA: Diagnosis not present

## 2018-11-21 DIAGNOSIS — E668 Other obesity: Secondary | ICD-10-CM | POA: Diagnosis not present

## 2018-11-21 DIAGNOSIS — E782 Mixed hyperlipidemia: Secondary | ICD-10-CM | POA: Diagnosis not present

## 2018-11-22 DIAGNOSIS — E291 Testicular hypofunction: Secondary | ICD-10-CM | POA: Diagnosis not present

## 2018-11-22 DIAGNOSIS — N529 Male erectile dysfunction, unspecified: Secondary | ICD-10-CM | POA: Diagnosis not present

## 2018-11-25 DIAGNOSIS — M50323 Other cervical disc degeneration at C6-C7 level: Secondary | ICD-10-CM | POA: Diagnosis not present

## 2018-11-25 DIAGNOSIS — M50322 Other cervical disc degeneration at C5-C6 level: Secondary | ICD-10-CM | POA: Diagnosis not present

## 2018-11-25 DIAGNOSIS — M9903 Segmental and somatic dysfunction of lumbar region: Secondary | ICD-10-CM | POA: Diagnosis not present

## 2018-11-25 DIAGNOSIS — M9902 Segmental and somatic dysfunction of thoracic region: Secondary | ICD-10-CM | POA: Diagnosis not present

## 2018-11-25 DIAGNOSIS — M4312 Spondylolisthesis, cervical region: Secondary | ICD-10-CM | POA: Diagnosis not present

## 2018-11-25 DIAGNOSIS — R7301 Impaired fasting glucose: Secondary | ICD-10-CM | POA: Diagnosis not present

## 2018-11-25 DIAGNOSIS — E782 Mixed hyperlipidemia: Secondary | ICD-10-CM | POA: Diagnosis not present

## 2018-11-25 DIAGNOSIS — M531 Cervicobrachial syndrome: Secondary | ICD-10-CM | POA: Diagnosis not present

## 2018-11-25 DIAGNOSIS — M5031 Other cervical disc degeneration,  high cervical region: Secondary | ICD-10-CM | POA: Diagnosis not present

## 2018-11-25 DIAGNOSIS — M50321 Other cervical disc degeneration at C4-C5 level: Secondary | ICD-10-CM | POA: Diagnosis not present

## 2018-11-26 DIAGNOSIS — M4312 Spondylolisthesis, cervical region: Secondary | ICD-10-CM | POA: Diagnosis not present

## 2018-11-26 DIAGNOSIS — M5031 Other cervical disc degeneration,  high cervical region: Secondary | ICD-10-CM | POA: Diagnosis not present

## 2018-11-26 DIAGNOSIS — M9903 Segmental and somatic dysfunction of lumbar region: Secondary | ICD-10-CM | POA: Diagnosis not present

## 2018-11-26 DIAGNOSIS — M9902 Segmental and somatic dysfunction of thoracic region: Secondary | ICD-10-CM | POA: Diagnosis not present

## 2018-11-26 DIAGNOSIS — M50321 Other cervical disc degeneration at C4-C5 level: Secondary | ICD-10-CM | POA: Diagnosis not present

## 2018-11-26 DIAGNOSIS — M50322 Other cervical disc degeneration at C5-C6 level: Secondary | ICD-10-CM | POA: Diagnosis not present

## 2018-11-26 DIAGNOSIS — M50323 Other cervical disc degeneration at C6-C7 level: Secondary | ICD-10-CM | POA: Diagnosis not present

## 2018-11-26 DIAGNOSIS — M531 Cervicobrachial syndrome: Secondary | ICD-10-CM | POA: Diagnosis not present

## 2018-11-28 DIAGNOSIS — M531 Cervicobrachial syndrome: Secondary | ICD-10-CM | POA: Diagnosis not present

## 2018-11-28 DIAGNOSIS — M4312 Spondylolisthesis, cervical region: Secondary | ICD-10-CM | POA: Diagnosis not present

## 2018-11-28 DIAGNOSIS — M9903 Segmental and somatic dysfunction of lumbar region: Secondary | ICD-10-CM | POA: Diagnosis not present

## 2018-11-28 DIAGNOSIS — M5031 Other cervical disc degeneration,  high cervical region: Secondary | ICD-10-CM | POA: Diagnosis not present

## 2018-11-28 DIAGNOSIS — M50323 Other cervical disc degeneration at C6-C7 level: Secondary | ICD-10-CM | POA: Diagnosis not present

## 2018-11-28 DIAGNOSIS — M9902 Segmental and somatic dysfunction of thoracic region: Secondary | ICD-10-CM | POA: Diagnosis not present

## 2018-11-28 DIAGNOSIS — M50321 Other cervical disc degeneration at C4-C5 level: Secondary | ICD-10-CM | POA: Diagnosis not present

## 2018-11-28 DIAGNOSIS — M50322 Other cervical disc degeneration at C5-C6 level: Secondary | ICD-10-CM | POA: Diagnosis not present

## 2018-12-02 DIAGNOSIS — M50322 Other cervical disc degeneration at C5-C6 level: Secondary | ICD-10-CM | POA: Diagnosis not present

## 2018-12-02 DIAGNOSIS — M50323 Other cervical disc degeneration at C6-C7 level: Secondary | ICD-10-CM | POA: Diagnosis not present

## 2018-12-02 DIAGNOSIS — M9903 Segmental and somatic dysfunction of lumbar region: Secondary | ICD-10-CM | POA: Diagnosis not present

## 2018-12-02 DIAGNOSIS — M50321 Other cervical disc degeneration at C4-C5 level: Secondary | ICD-10-CM | POA: Diagnosis not present

## 2018-12-02 DIAGNOSIS — M9902 Segmental and somatic dysfunction of thoracic region: Secondary | ICD-10-CM | POA: Diagnosis not present

## 2018-12-02 DIAGNOSIS — M2550 Pain in unspecified joint: Secondary | ICD-10-CM | POA: Diagnosis not present

## 2018-12-02 DIAGNOSIS — R69 Illness, unspecified: Secondary | ICD-10-CM | POA: Diagnosis not present

## 2018-12-02 DIAGNOSIS — M531 Cervicobrachial syndrome: Secondary | ICD-10-CM | POA: Diagnosis not present

## 2018-12-02 DIAGNOSIS — M5031 Other cervical disc degeneration,  high cervical region: Secondary | ICD-10-CM | POA: Diagnosis not present

## 2018-12-02 DIAGNOSIS — G479 Sleep disorder, unspecified: Secondary | ICD-10-CM | POA: Diagnosis not present

## 2018-12-02 DIAGNOSIS — E291 Testicular hypofunction: Secondary | ICD-10-CM | POA: Diagnosis not present

## 2018-12-02 DIAGNOSIS — M4312 Spondylolisthesis, cervical region: Secondary | ICD-10-CM | POA: Diagnosis not present

## 2018-12-03 DIAGNOSIS — M4312 Spondylolisthesis, cervical region: Secondary | ICD-10-CM | POA: Diagnosis not present

## 2018-12-03 DIAGNOSIS — M5031 Other cervical disc degeneration,  high cervical region: Secondary | ICD-10-CM | POA: Diagnosis not present

## 2018-12-03 DIAGNOSIS — M50323 Other cervical disc degeneration at C6-C7 level: Secondary | ICD-10-CM | POA: Diagnosis not present

## 2018-12-03 DIAGNOSIS — M9903 Segmental and somatic dysfunction of lumbar region: Secondary | ICD-10-CM | POA: Diagnosis not present

## 2018-12-03 DIAGNOSIS — M50321 Other cervical disc degeneration at C4-C5 level: Secondary | ICD-10-CM | POA: Diagnosis not present

## 2018-12-03 DIAGNOSIS — M50322 Other cervical disc degeneration at C5-C6 level: Secondary | ICD-10-CM | POA: Diagnosis not present

## 2018-12-03 DIAGNOSIS — M9902 Segmental and somatic dysfunction of thoracic region: Secondary | ICD-10-CM | POA: Diagnosis not present

## 2018-12-03 DIAGNOSIS — M531 Cervicobrachial syndrome: Secondary | ICD-10-CM | POA: Diagnosis not present

## 2018-12-05 DIAGNOSIS — M9902 Segmental and somatic dysfunction of thoracic region: Secondary | ICD-10-CM | POA: Diagnosis not present

## 2018-12-05 DIAGNOSIS — M531 Cervicobrachial syndrome: Secondary | ICD-10-CM | POA: Diagnosis not present

## 2018-12-05 DIAGNOSIS — M50321 Other cervical disc degeneration at C4-C5 level: Secondary | ICD-10-CM | POA: Diagnosis not present

## 2018-12-05 DIAGNOSIS — M5031 Other cervical disc degeneration,  high cervical region: Secondary | ICD-10-CM | POA: Diagnosis not present

## 2018-12-05 DIAGNOSIS — M9903 Segmental and somatic dysfunction of lumbar region: Secondary | ICD-10-CM | POA: Diagnosis not present

## 2018-12-05 DIAGNOSIS — M50322 Other cervical disc degeneration at C5-C6 level: Secondary | ICD-10-CM | POA: Diagnosis not present

## 2018-12-05 DIAGNOSIS — M4312 Spondylolisthesis, cervical region: Secondary | ICD-10-CM | POA: Diagnosis not present

## 2018-12-05 DIAGNOSIS — M50323 Other cervical disc degeneration at C6-C7 level: Secondary | ICD-10-CM | POA: Diagnosis not present

## 2018-12-06 DIAGNOSIS — E291 Testicular hypofunction: Secondary | ICD-10-CM | POA: Diagnosis not present

## 2018-12-06 DIAGNOSIS — R69 Illness, unspecified: Secondary | ICD-10-CM | POA: Diagnosis not present

## 2018-12-06 DIAGNOSIS — N529 Male erectile dysfunction, unspecified: Secondary | ICD-10-CM | POA: Diagnosis not present

## 2018-12-10 DIAGNOSIS — M9902 Segmental and somatic dysfunction of thoracic region: Secondary | ICD-10-CM | POA: Diagnosis not present

## 2018-12-10 DIAGNOSIS — M4312 Spondylolisthesis, cervical region: Secondary | ICD-10-CM | POA: Diagnosis not present

## 2018-12-10 DIAGNOSIS — M50321 Other cervical disc degeneration at C4-C5 level: Secondary | ICD-10-CM | POA: Diagnosis not present

## 2018-12-10 DIAGNOSIS — M9903 Segmental and somatic dysfunction of lumbar region: Secondary | ICD-10-CM | POA: Diagnosis not present

## 2018-12-10 DIAGNOSIS — M50323 Other cervical disc degeneration at C6-C7 level: Secondary | ICD-10-CM | POA: Diagnosis not present

## 2018-12-10 DIAGNOSIS — M50322 Other cervical disc degeneration at C5-C6 level: Secondary | ICD-10-CM | POA: Diagnosis not present

## 2018-12-10 DIAGNOSIS — M531 Cervicobrachial syndrome: Secondary | ICD-10-CM | POA: Diagnosis not present

## 2018-12-10 DIAGNOSIS — M5031 Other cervical disc degeneration,  high cervical region: Secondary | ICD-10-CM | POA: Diagnosis not present

## 2018-12-11 DIAGNOSIS — M5031 Other cervical disc degeneration,  high cervical region: Secondary | ICD-10-CM | POA: Diagnosis not present

## 2018-12-11 DIAGNOSIS — M4312 Spondylolisthesis, cervical region: Secondary | ICD-10-CM | POA: Diagnosis not present

## 2018-12-11 DIAGNOSIS — M50321 Other cervical disc degeneration at C4-C5 level: Secondary | ICD-10-CM | POA: Diagnosis not present

## 2018-12-11 DIAGNOSIS — M50322 Other cervical disc degeneration at C5-C6 level: Secondary | ICD-10-CM | POA: Diagnosis not present

## 2018-12-11 DIAGNOSIS — M9902 Segmental and somatic dysfunction of thoracic region: Secondary | ICD-10-CM | POA: Diagnosis not present

## 2018-12-11 DIAGNOSIS — M50323 Other cervical disc degeneration at C6-C7 level: Secondary | ICD-10-CM | POA: Diagnosis not present

## 2018-12-11 DIAGNOSIS — M531 Cervicobrachial syndrome: Secondary | ICD-10-CM | POA: Diagnosis not present

## 2018-12-11 DIAGNOSIS — M9903 Segmental and somatic dysfunction of lumbar region: Secondary | ICD-10-CM | POA: Diagnosis not present

## 2018-12-12 DIAGNOSIS — M9902 Segmental and somatic dysfunction of thoracic region: Secondary | ICD-10-CM | POA: Diagnosis not present

## 2018-12-12 DIAGNOSIS — M50323 Other cervical disc degeneration at C6-C7 level: Secondary | ICD-10-CM | POA: Diagnosis not present

## 2018-12-12 DIAGNOSIS — M9903 Segmental and somatic dysfunction of lumbar region: Secondary | ICD-10-CM | POA: Diagnosis not present

## 2018-12-12 DIAGNOSIS — M531 Cervicobrachial syndrome: Secondary | ICD-10-CM | POA: Diagnosis not present

## 2018-12-12 DIAGNOSIS — M50322 Other cervical disc degeneration at C5-C6 level: Secondary | ICD-10-CM | POA: Diagnosis not present

## 2018-12-12 DIAGNOSIS — M4312 Spondylolisthesis, cervical region: Secondary | ICD-10-CM | POA: Diagnosis not present

## 2018-12-12 DIAGNOSIS — M50321 Other cervical disc degeneration at C4-C5 level: Secondary | ICD-10-CM | POA: Diagnosis not present

## 2018-12-12 DIAGNOSIS — M5031 Other cervical disc degeneration,  high cervical region: Secondary | ICD-10-CM | POA: Diagnosis not present

## 2018-12-13 DIAGNOSIS — E291 Testicular hypofunction: Secondary | ICD-10-CM | POA: Diagnosis not present

## 2018-12-16 DIAGNOSIS — M5031 Other cervical disc degeneration,  high cervical region: Secondary | ICD-10-CM | POA: Diagnosis not present

## 2018-12-16 DIAGNOSIS — M9903 Segmental and somatic dysfunction of lumbar region: Secondary | ICD-10-CM | POA: Diagnosis not present

## 2018-12-16 DIAGNOSIS — M531 Cervicobrachial syndrome: Secondary | ICD-10-CM | POA: Diagnosis not present

## 2018-12-16 DIAGNOSIS — M50321 Other cervical disc degeneration at C4-C5 level: Secondary | ICD-10-CM | POA: Diagnosis not present

## 2018-12-16 DIAGNOSIS — M50323 Other cervical disc degeneration at C6-C7 level: Secondary | ICD-10-CM | POA: Diagnosis not present

## 2018-12-16 DIAGNOSIS — M4312 Spondylolisthesis, cervical region: Secondary | ICD-10-CM | POA: Diagnosis not present

## 2018-12-16 DIAGNOSIS — M9902 Segmental and somatic dysfunction of thoracic region: Secondary | ICD-10-CM | POA: Diagnosis not present

## 2018-12-16 DIAGNOSIS — M50322 Other cervical disc degeneration at C5-C6 level: Secondary | ICD-10-CM | POA: Diagnosis not present

## 2018-12-17 DIAGNOSIS — M9903 Segmental and somatic dysfunction of lumbar region: Secondary | ICD-10-CM | POA: Diagnosis not present

## 2018-12-17 DIAGNOSIS — M4312 Spondylolisthesis, cervical region: Secondary | ICD-10-CM | POA: Diagnosis not present

## 2018-12-17 DIAGNOSIS — M5031 Other cervical disc degeneration,  high cervical region: Secondary | ICD-10-CM | POA: Diagnosis not present

## 2018-12-17 DIAGNOSIS — M50322 Other cervical disc degeneration at C5-C6 level: Secondary | ICD-10-CM | POA: Diagnosis not present

## 2018-12-17 DIAGNOSIS — M531 Cervicobrachial syndrome: Secondary | ICD-10-CM | POA: Diagnosis not present

## 2018-12-17 DIAGNOSIS — M9902 Segmental and somatic dysfunction of thoracic region: Secondary | ICD-10-CM | POA: Diagnosis not present

## 2018-12-17 DIAGNOSIS — M50323 Other cervical disc degeneration at C6-C7 level: Secondary | ICD-10-CM | POA: Diagnosis not present

## 2018-12-17 DIAGNOSIS — M50321 Other cervical disc degeneration at C4-C5 level: Secondary | ICD-10-CM | POA: Diagnosis not present

## 2018-12-20 DIAGNOSIS — E291 Testicular hypofunction: Secondary | ICD-10-CM | POA: Diagnosis not present

## 2018-12-20 DIAGNOSIS — M4312 Spondylolisthesis, cervical region: Secondary | ICD-10-CM | POA: Diagnosis not present

## 2018-12-20 DIAGNOSIS — M9903 Segmental and somatic dysfunction of lumbar region: Secondary | ICD-10-CM | POA: Diagnosis not present

## 2018-12-20 DIAGNOSIS — M50322 Other cervical disc degeneration at C5-C6 level: Secondary | ICD-10-CM | POA: Diagnosis not present

## 2018-12-20 DIAGNOSIS — M5031 Other cervical disc degeneration,  high cervical region: Secondary | ICD-10-CM | POA: Diagnosis not present

## 2018-12-20 DIAGNOSIS — M9902 Segmental and somatic dysfunction of thoracic region: Secondary | ICD-10-CM | POA: Diagnosis not present

## 2018-12-20 DIAGNOSIS — M531 Cervicobrachial syndrome: Secondary | ICD-10-CM | POA: Diagnosis not present

## 2018-12-20 DIAGNOSIS — M50323 Other cervical disc degeneration at C6-C7 level: Secondary | ICD-10-CM | POA: Diagnosis not present

## 2018-12-20 DIAGNOSIS — M50321 Other cervical disc degeneration at C4-C5 level: Secondary | ICD-10-CM | POA: Diagnosis not present

## 2018-12-24 DIAGNOSIS — M531 Cervicobrachial syndrome: Secondary | ICD-10-CM | POA: Diagnosis not present

## 2018-12-24 DIAGNOSIS — M50321 Other cervical disc degeneration at C4-C5 level: Secondary | ICD-10-CM | POA: Diagnosis not present

## 2018-12-24 DIAGNOSIS — M5031 Other cervical disc degeneration,  high cervical region: Secondary | ICD-10-CM | POA: Diagnosis not present

## 2018-12-24 DIAGNOSIS — M50323 Other cervical disc degeneration at C6-C7 level: Secondary | ICD-10-CM | POA: Diagnosis not present

## 2018-12-24 DIAGNOSIS — M9903 Segmental and somatic dysfunction of lumbar region: Secondary | ICD-10-CM | POA: Diagnosis not present

## 2018-12-24 DIAGNOSIS — M50322 Other cervical disc degeneration at C5-C6 level: Secondary | ICD-10-CM | POA: Diagnosis not present

## 2018-12-24 DIAGNOSIS — M4312 Spondylolisthesis, cervical region: Secondary | ICD-10-CM | POA: Diagnosis not present

## 2018-12-24 DIAGNOSIS — M9902 Segmental and somatic dysfunction of thoracic region: Secondary | ICD-10-CM | POA: Diagnosis not present

## 2018-12-27 DIAGNOSIS — M50321 Other cervical disc degeneration at C4-C5 level: Secondary | ICD-10-CM | POA: Diagnosis not present

## 2018-12-27 DIAGNOSIS — M4312 Spondylolisthesis, cervical region: Secondary | ICD-10-CM | POA: Diagnosis not present

## 2018-12-27 DIAGNOSIS — M9903 Segmental and somatic dysfunction of lumbar region: Secondary | ICD-10-CM | POA: Diagnosis not present

## 2018-12-27 DIAGNOSIS — M50323 Other cervical disc degeneration at C6-C7 level: Secondary | ICD-10-CM | POA: Diagnosis not present

## 2018-12-27 DIAGNOSIS — M531 Cervicobrachial syndrome: Secondary | ICD-10-CM | POA: Diagnosis not present

## 2018-12-27 DIAGNOSIS — E291 Testicular hypofunction: Secondary | ICD-10-CM | POA: Diagnosis not present

## 2018-12-27 DIAGNOSIS — M9902 Segmental and somatic dysfunction of thoracic region: Secondary | ICD-10-CM | POA: Diagnosis not present

## 2018-12-27 DIAGNOSIS — M5031 Other cervical disc degeneration,  high cervical region: Secondary | ICD-10-CM | POA: Diagnosis not present

## 2018-12-27 DIAGNOSIS — M50322 Other cervical disc degeneration at C5-C6 level: Secondary | ICD-10-CM | POA: Diagnosis not present

## 2018-12-31 DIAGNOSIS — M50323 Other cervical disc degeneration at C6-C7 level: Secondary | ICD-10-CM | POA: Diagnosis not present

## 2018-12-31 DIAGNOSIS — M9903 Segmental and somatic dysfunction of lumbar region: Secondary | ICD-10-CM | POA: Diagnosis not present

## 2018-12-31 DIAGNOSIS — M50321 Other cervical disc degeneration at C4-C5 level: Secondary | ICD-10-CM | POA: Diagnosis not present

## 2018-12-31 DIAGNOSIS — M5031 Other cervical disc degeneration,  high cervical region: Secondary | ICD-10-CM | POA: Diagnosis not present

## 2018-12-31 DIAGNOSIS — M531 Cervicobrachial syndrome: Secondary | ICD-10-CM | POA: Diagnosis not present

## 2018-12-31 DIAGNOSIS — M50322 Other cervical disc degeneration at C5-C6 level: Secondary | ICD-10-CM | POA: Diagnosis not present

## 2018-12-31 DIAGNOSIS — M9902 Segmental and somatic dysfunction of thoracic region: Secondary | ICD-10-CM | POA: Diagnosis not present

## 2018-12-31 DIAGNOSIS — M4312 Spondylolisthesis, cervical region: Secondary | ICD-10-CM | POA: Diagnosis not present

## 2019-01-02 DIAGNOSIS — M9902 Segmental and somatic dysfunction of thoracic region: Secondary | ICD-10-CM | POA: Diagnosis not present

## 2019-01-02 DIAGNOSIS — M5031 Other cervical disc degeneration,  high cervical region: Secondary | ICD-10-CM | POA: Diagnosis not present

## 2019-01-02 DIAGNOSIS — M50323 Other cervical disc degeneration at C6-C7 level: Secondary | ICD-10-CM | POA: Diagnosis not present

## 2019-01-02 DIAGNOSIS — M531 Cervicobrachial syndrome: Secondary | ICD-10-CM | POA: Diagnosis not present

## 2019-01-02 DIAGNOSIS — M4312 Spondylolisthesis, cervical region: Secondary | ICD-10-CM | POA: Diagnosis not present

## 2019-01-02 DIAGNOSIS — M50321 Other cervical disc degeneration at C4-C5 level: Secondary | ICD-10-CM | POA: Diagnosis not present

## 2019-01-02 DIAGNOSIS — M50322 Other cervical disc degeneration at C5-C6 level: Secondary | ICD-10-CM | POA: Diagnosis not present

## 2019-01-02 DIAGNOSIS — M9903 Segmental and somatic dysfunction of lumbar region: Secondary | ICD-10-CM | POA: Diagnosis not present

## 2019-01-03 DIAGNOSIS — E291 Testicular hypofunction: Secondary | ICD-10-CM | POA: Diagnosis not present

## 2019-01-08 DIAGNOSIS — M5031 Other cervical disc degeneration,  high cervical region: Secondary | ICD-10-CM | POA: Diagnosis not present

## 2019-01-08 DIAGNOSIS — M4312 Spondylolisthesis, cervical region: Secondary | ICD-10-CM | POA: Diagnosis not present

## 2019-01-08 DIAGNOSIS — M9902 Segmental and somatic dysfunction of thoracic region: Secondary | ICD-10-CM | POA: Diagnosis not present

## 2019-01-08 DIAGNOSIS — M531 Cervicobrachial syndrome: Secondary | ICD-10-CM | POA: Diagnosis not present

## 2019-01-08 DIAGNOSIS — M50323 Other cervical disc degeneration at C6-C7 level: Secondary | ICD-10-CM | POA: Diagnosis not present

## 2019-01-08 DIAGNOSIS — M9903 Segmental and somatic dysfunction of lumbar region: Secondary | ICD-10-CM | POA: Diagnosis not present

## 2019-01-08 DIAGNOSIS — M50322 Other cervical disc degeneration at C5-C6 level: Secondary | ICD-10-CM | POA: Diagnosis not present

## 2019-01-08 DIAGNOSIS — M50321 Other cervical disc degeneration at C4-C5 level: Secondary | ICD-10-CM | POA: Diagnosis not present

## 2019-01-09 DIAGNOSIS — M4312 Spondylolisthesis, cervical region: Secondary | ICD-10-CM | POA: Diagnosis not present

## 2019-01-09 DIAGNOSIS — M50321 Other cervical disc degeneration at C4-C5 level: Secondary | ICD-10-CM | POA: Diagnosis not present

## 2019-01-09 DIAGNOSIS — M50323 Other cervical disc degeneration at C6-C7 level: Secondary | ICD-10-CM | POA: Diagnosis not present

## 2019-01-09 DIAGNOSIS — M50322 Other cervical disc degeneration at C5-C6 level: Secondary | ICD-10-CM | POA: Diagnosis not present

## 2019-01-09 DIAGNOSIS — M9903 Segmental and somatic dysfunction of lumbar region: Secondary | ICD-10-CM | POA: Diagnosis not present

## 2019-01-09 DIAGNOSIS — M531 Cervicobrachial syndrome: Secondary | ICD-10-CM | POA: Diagnosis not present

## 2019-01-09 DIAGNOSIS — M5031 Other cervical disc degeneration,  high cervical region: Secondary | ICD-10-CM | POA: Diagnosis not present

## 2019-01-09 DIAGNOSIS — M9902 Segmental and somatic dysfunction of thoracic region: Secondary | ICD-10-CM | POA: Diagnosis not present

## 2019-01-10 DIAGNOSIS — E291 Testicular hypofunction: Secondary | ICD-10-CM | POA: Diagnosis not present

## 2019-01-14 DIAGNOSIS — M9902 Segmental and somatic dysfunction of thoracic region: Secondary | ICD-10-CM | POA: Diagnosis not present

## 2019-01-14 DIAGNOSIS — M50322 Other cervical disc degeneration at C5-C6 level: Secondary | ICD-10-CM | POA: Diagnosis not present

## 2019-01-14 DIAGNOSIS — M531 Cervicobrachial syndrome: Secondary | ICD-10-CM | POA: Diagnosis not present

## 2019-01-14 DIAGNOSIS — M50321 Other cervical disc degeneration at C4-C5 level: Secondary | ICD-10-CM | POA: Diagnosis not present

## 2019-01-14 DIAGNOSIS — M50323 Other cervical disc degeneration at C6-C7 level: Secondary | ICD-10-CM | POA: Diagnosis not present

## 2019-01-14 DIAGNOSIS — M9903 Segmental and somatic dysfunction of lumbar region: Secondary | ICD-10-CM | POA: Diagnosis not present

## 2019-01-14 DIAGNOSIS — M4312 Spondylolisthesis, cervical region: Secondary | ICD-10-CM | POA: Diagnosis not present

## 2019-01-14 DIAGNOSIS — M5031 Other cervical disc degeneration,  high cervical region: Secondary | ICD-10-CM | POA: Diagnosis not present

## 2019-01-16 DIAGNOSIS — M9902 Segmental and somatic dysfunction of thoracic region: Secondary | ICD-10-CM | POA: Diagnosis not present

## 2019-01-16 DIAGNOSIS — M531 Cervicobrachial syndrome: Secondary | ICD-10-CM | POA: Diagnosis not present

## 2019-01-16 DIAGNOSIS — M5031 Other cervical disc degeneration,  high cervical region: Secondary | ICD-10-CM | POA: Diagnosis not present

## 2019-01-16 DIAGNOSIS — M4312 Spondylolisthesis, cervical region: Secondary | ICD-10-CM | POA: Diagnosis not present

## 2019-01-16 DIAGNOSIS — M50323 Other cervical disc degeneration at C6-C7 level: Secondary | ICD-10-CM | POA: Diagnosis not present

## 2019-01-16 DIAGNOSIS — M9903 Segmental and somatic dysfunction of lumbar region: Secondary | ICD-10-CM | POA: Diagnosis not present

## 2019-01-16 DIAGNOSIS — M50322 Other cervical disc degeneration at C5-C6 level: Secondary | ICD-10-CM | POA: Diagnosis not present

## 2019-01-16 DIAGNOSIS — M50321 Other cervical disc degeneration at C4-C5 level: Secondary | ICD-10-CM | POA: Diagnosis not present

## 2019-01-17 DIAGNOSIS — E291 Testicular hypofunction: Secondary | ICD-10-CM | POA: Diagnosis not present

## 2019-01-24 DIAGNOSIS — E291 Testicular hypofunction: Secondary | ICD-10-CM | POA: Diagnosis not present

## 2019-01-24 DIAGNOSIS — N529 Male erectile dysfunction, unspecified: Secondary | ICD-10-CM | POA: Diagnosis not present

## 2019-01-31 DIAGNOSIS — E291 Testicular hypofunction: Secondary | ICD-10-CM | POA: Diagnosis not present

## 2019-02-14 DIAGNOSIS — E291 Testicular hypofunction: Secondary | ICD-10-CM | POA: Diagnosis not present

## 2019-02-21 DIAGNOSIS — E291 Testicular hypofunction: Secondary | ICD-10-CM | POA: Diagnosis not present

## 2019-02-28 DIAGNOSIS — E291 Testicular hypofunction: Secondary | ICD-10-CM | POA: Diagnosis not present

## 2019-03-07 DIAGNOSIS — E291 Testicular hypofunction: Secondary | ICD-10-CM | POA: Diagnosis not present

## 2019-03-14 DIAGNOSIS — E291 Testicular hypofunction: Secondary | ICD-10-CM | POA: Diagnosis not present

## 2019-03-28 DIAGNOSIS — E291 Testicular hypofunction: Secondary | ICD-10-CM | POA: Diagnosis not present

## 2019-04-11 DIAGNOSIS — E291 Testicular hypofunction: Secondary | ICD-10-CM | POA: Diagnosis not present

## 2019-04-18 DIAGNOSIS — E291 Testicular hypofunction: Secondary | ICD-10-CM | POA: Diagnosis not present

## 2019-04-23 DIAGNOSIS — R69 Illness, unspecified: Secondary | ICD-10-CM | POA: Diagnosis not present

## 2019-05-01 DIAGNOSIS — L720 Epidermal cyst: Secondary | ICD-10-CM | POA: Diagnosis not present

## 2019-05-01 DIAGNOSIS — L821 Other seborrheic keratosis: Secondary | ICD-10-CM | POA: Diagnosis not present

## 2019-05-01 DIAGNOSIS — L570 Actinic keratosis: Secondary | ICD-10-CM | POA: Diagnosis not present

## 2019-05-01 DIAGNOSIS — L300 Nummular dermatitis: Secondary | ICD-10-CM | POA: Diagnosis not present

## 2019-05-01 DIAGNOSIS — L853 Xerosis cutis: Secondary | ICD-10-CM | POA: Diagnosis not present

## 2019-05-02 DIAGNOSIS — E291 Testicular hypofunction: Secondary | ICD-10-CM | POA: Diagnosis not present

## 2019-05-09 DIAGNOSIS — E291 Testicular hypofunction: Secondary | ICD-10-CM | POA: Diagnosis not present

## 2019-05-14 DIAGNOSIS — R3129 Other microscopic hematuria: Secondary | ICD-10-CM | POA: Diagnosis not present

## 2019-05-14 DIAGNOSIS — K5730 Diverticulosis of large intestine without perforation or abscess without bleeding: Secondary | ICD-10-CM | POA: Diagnosis not present

## 2019-05-14 DIAGNOSIS — M47816 Spondylosis without myelopathy or radiculopathy, lumbar region: Secondary | ICD-10-CM | POA: Diagnosis not present

## 2019-05-14 DIAGNOSIS — M545 Low back pain: Secondary | ICD-10-CM | POA: Diagnosis not present

## 2019-05-14 DIAGNOSIS — N200 Calculus of kidney: Secondary | ICD-10-CM | POA: Diagnosis not present

## 2019-05-14 DIAGNOSIS — Z23 Encounter for immunization: Secondary | ICD-10-CM | POA: Diagnosis not present

## 2019-05-14 DIAGNOSIS — I700 Atherosclerosis of aorta: Secondary | ICD-10-CM | POA: Diagnosis not present

## 2019-05-16 DIAGNOSIS — E291 Testicular hypofunction: Secondary | ICD-10-CM | POA: Diagnosis not present

## 2019-05-23 DIAGNOSIS — E291 Testicular hypofunction: Secondary | ICD-10-CM | POA: Diagnosis not present

## 2019-05-23 DIAGNOSIS — N529 Male erectile dysfunction, unspecified: Secondary | ICD-10-CM | POA: Diagnosis not present

## 2019-05-26 DIAGNOSIS — R69 Illness, unspecified: Secondary | ICD-10-CM | POA: Diagnosis not present

## 2019-05-30 DIAGNOSIS — E291 Testicular hypofunction: Secondary | ICD-10-CM | POA: Diagnosis not present

## 2019-06-06 DIAGNOSIS — E291 Testicular hypofunction: Secondary | ICD-10-CM | POA: Diagnosis not present

## 2019-06-13 DIAGNOSIS — E291 Testicular hypofunction: Secondary | ICD-10-CM | POA: Diagnosis not present

## 2019-06-27 DIAGNOSIS — E291 Testicular hypofunction: Secondary | ICD-10-CM | POA: Diagnosis not present

## 2019-06-29 DIAGNOSIS — Z809 Family history of malignant neoplasm, unspecified: Secondary | ICD-10-CM | POA: Diagnosis not present

## 2019-06-29 DIAGNOSIS — Z87891 Personal history of nicotine dependence: Secondary | ICD-10-CM | POA: Diagnosis not present

## 2019-06-29 DIAGNOSIS — N3941 Urge incontinence: Secondary | ICD-10-CM | POA: Diagnosis not present

## 2019-06-29 DIAGNOSIS — K219 Gastro-esophageal reflux disease without esophagitis: Secondary | ICD-10-CM | POA: Diagnosis not present

## 2019-06-29 DIAGNOSIS — E669 Obesity, unspecified: Secondary | ICD-10-CM | POA: Diagnosis not present

## 2019-06-29 DIAGNOSIS — Z8249 Family history of ischemic heart disease and other diseases of the circulatory system: Secondary | ICD-10-CM | POA: Diagnosis not present

## 2019-07-11 DIAGNOSIS — E291 Testicular hypofunction: Secondary | ICD-10-CM | POA: Diagnosis not present

## 2019-07-25 DIAGNOSIS — E291 Testicular hypofunction: Secondary | ICD-10-CM | POA: Diagnosis not present

## 2019-08-11 DIAGNOSIS — M5431 Sciatica, right side: Secondary | ICD-10-CM | POA: Diagnosis not present

## 2019-08-14 DIAGNOSIS — N529 Male erectile dysfunction, unspecified: Secondary | ICD-10-CM | POA: Diagnosis not present

## 2019-08-14 DIAGNOSIS — R69 Illness, unspecified: Secondary | ICD-10-CM | POA: Diagnosis not present

## 2019-08-14 DIAGNOSIS — E291 Testicular hypofunction: Secondary | ICD-10-CM | POA: Diagnosis not present

## 2019-08-22 DIAGNOSIS — E291 Testicular hypofunction: Secondary | ICD-10-CM | POA: Diagnosis not present

## 2019-08-22 DIAGNOSIS — M2550 Pain in unspecified joint: Secondary | ICD-10-CM | POA: Diagnosis not present

## 2019-08-22 DIAGNOSIS — G479 Sleep disorder, unspecified: Secondary | ICD-10-CM | POA: Diagnosis not present

## 2019-08-27 DIAGNOSIS — M25562 Pain in left knee: Secondary | ICD-10-CM | POA: Diagnosis not present

## 2019-08-27 DIAGNOSIS — M1712 Unilateral primary osteoarthritis, left knee: Secondary | ICD-10-CM | POA: Diagnosis not present

## 2019-08-29 DIAGNOSIS — E291 Testicular hypofunction: Secondary | ICD-10-CM | POA: Diagnosis not present

## 2019-09-05 DIAGNOSIS — E291 Testicular hypofunction: Secondary | ICD-10-CM | POA: Diagnosis not present

## 2019-09-11 ENCOUNTER — Other Ambulatory Visit: Payer: Self-pay | Admitting: Family Medicine

## 2019-09-12 DIAGNOSIS — E291 Testicular hypofunction: Secondary | ICD-10-CM | POA: Diagnosis not present

## 2019-09-19 DIAGNOSIS — E291 Testicular hypofunction: Secondary | ICD-10-CM | POA: Diagnosis not present

## 2019-09-26 DIAGNOSIS — E291 Testicular hypofunction: Secondary | ICD-10-CM | POA: Diagnosis not present

## 2019-10-03 DIAGNOSIS — E291 Testicular hypofunction: Secondary | ICD-10-CM | POA: Diagnosis not present

## 2019-10-06 ENCOUNTER — Other Ambulatory Visit: Payer: Self-pay | Admitting: Family Medicine

## 2019-10-14 DIAGNOSIS — M9902 Segmental and somatic dysfunction of thoracic region: Secondary | ICD-10-CM | POA: Diagnosis not present

## 2019-10-14 DIAGNOSIS — M50321 Other cervical disc degeneration at C4-C5 level: Secondary | ICD-10-CM | POA: Diagnosis not present

## 2019-10-14 DIAGNOSIS — M50322 Other cervical disc degeneration at C5-C6 level: Secondary | ICD-10-CM | POA: Diagnosis not present

## 2019-10-14 DIAGNOSIS — M531 Cervicobrachial syndrome: Secondary | ICD-10-CM | POA: Diagnosis not present

## 2019-10-14 DIAGNOSIS — M50323 Other cervical disc degeneration at C6-C7 level: Secondary | ICD-10-CM | POA: Diagnosis not present

## 2019-10-14 DIAGNOSIS — M9903 Segmental and somatic dysfunction of lumbar region: Secondary | ICD-10-CM | POA: Diagnosis not present

## 2019-10-14 DIAGNOSIS — M5031 Other cervical disc degeneration,  high cervical region: Secondary | ICD-10-CM | POA: Diagnosis not present

## 2019-10-14 DIAGNOSIS — M4312 Spondylolisthesis, cervical region: Secondary | ICD-10-CM | POA: Diagnosis not present

## 2019-10-15 DIAGNOSIS — M50322 Other cervical disc degeneration at C5-C6 level: Secondary | ICD-10-CM | POA: Diagnosis not present

## 2019-10-15 DIAGNOSIS — M5031 Other cervical disc degeneration,  high cervical region: Secondary | ICD-10-CM | POA: Diagnosis not present

## 2019-10-15 DIAGNOSIS — M50321 Other cervical disc degeneration at C4-C5 level: Secondary | ICD-10-CM | POA: Diagnosis not present

## 2019-10-15 DIAGNOSIS — M4312 Spondylolisthesis, cervical region: Secondary | ICD-10-CM | POA: Diagnosis not present

## 2019-10-15 DIAGNOSIS — M9902 Segmental and somatic dysfunction of thoracic region: Secondary | ICD-10-CM | POA: Diagnosis not present

## 2019-10-15 DIAGNOSIS — M9903 Segmental and somatic dysfunction of lumbar region: Secondary | ICD-10-CM | POA: Diagnosis not present

## 2019-10-15 DIAGNOSIS — M50323 Other cervical disc degeneration at C6-C7 level: Secondary | ICD-10-CM | POA: Diagnosis not present

## 2019-10-15 DIAGNOSIS — M531 Cervicobrachial syndrome: Secondary | ICD-10-CM | POA: Diagnosis not present

## 2019-10-17 DIAGNOSIS — M9903 Segmental and somatic dysfunction of lumbar region: Secondary | ICD-10-CM | POA: Diagnosis not present

## 2019-10-17 DIAGNOSIS — M9902 Segmental and somatic dysfunction of thoracic region: Secondary | ICD-10-CM | POA: Diagnosis not present

## 2019-10-17 DIAGNOSIS — E291 Testicular hypofunction: Secondary | ICD-10-CM | POA: Diagnosis not present

## 2019-10-17 DIAGNOSIS — M50322 Other cervical disc degeneration at C5-C6 level: Secondary | ICD-10-CM | POA: Diagnosis not present

## 2019-10-17 DIAGNOSIS — M531 Cervicobrachial syndrome: Secondary | ICD-10-CM | POA: Diagnosis not present

## 2019-10-17 DIAGNOSIS — M4312 Spondylolisthesis, cervical region: Secondary | ICD-10-CM | POA: Diagnosis not present

## 2019-10-17 DIAGNOSIS — M50323 Other cervical disc degeneration at C6-C7 level: Secondary | ICD-10-CM | POA: Diagnosis not present

## 2019-10-17 DIAGNOSIS — M5031 Other cervical disc degeneration,  high cervical region: Secondary | ICD-10-CM | POA: Diagnosis not present

## 2019-10-17 DIAGNOSIS — M50321 Other cervical disc degeneration at C4-C5 level: Secondary | ICD-10-CM | POA: Diagnosis not present

## 2019-10-20 DIAGNOSIS — M50323 Other cervical disc degeneration at C6-C7 level: Secondary | ICD-10-CM | POA: Diagnosis not present

## 2019-10-20 DIAGNOSIS — M5031 Other cervical disc degeneration,  high cervical region: Secondary | ICD-10-CM | POA: Diagnosis not present

## 2019-10-20 DIAGNOSIS — M9902 Segmental and somatic dysfunction of thoracic region: Secondary | ICD-10-CM | POA: Diagnosis not present

## 2019-10-20 DIAGNOSIS — M531 Cervicobrachial syndrome: Secondary | ICD-10-CM | POA: Diagnosis not present

## 2019-10-20 DIAGNOSIS — M50321 Other cervical disc degeneration at C4-C5 level: Secondary | ICD-10-CM | POA: Diagnosis not present

## 2019-10-20 DIAGNOSIS — M50322 Other cervical disc degeneration at C5-C6 level: Secondary | ICD-10-CM | POA: Diagnosis not present

## 2019-10-20 DIAGNOSIS — M9903 Segmental and somatic dysfunction of lumbar region: Secondary | ICD-10-CM | POA: Diagnosis not present

## 2019-10-20 DIAGNOSIS — M4312 Spondylolisthesis, cervical region: Secondary | ICD-10-CM | POA: Diagnosis not present

## 2019-10-21 DIAGNOSIS — M5031 Other cervical disc degeneration,  high cervical region: Secondary | ICD-10-CM | POA: Diagnosis not present

## 2019-10-21 DIAGNOSIS — M50322 Other cervical disc degeneration at C5-C6 level: Secondary | ICD-10-CM | POA: Diagnosis not present

## 2019-10-21 DIAGNOSIS — M50323 Other cervical disc degeneration at C6-C7 level: Secondary | ICD-10-CM | POA: Diagnosis not present

## 2019-10-21 DIAGNOSIS — M531 Cervicobrachial syndrome: Secondary | ICD-10-CM | POA: Diagnosis not present

## 2019-10-21 DIAGNOSIS — M9903 Segmental and somatic dysfunction of lumbar region: Secondary | ICD-10-CM | POA: Diagnosis not present

## 2019-10-21 DIAGNOSIS — M4312 Spondylolisthesis, cervical region: Secondary | ICD-10-CM | POA: Diagnosis not present

## 2019-10-21 DIAGNOSIS — M50321 Other cervical disc degeneration at C4-C5 level: Secondary | ICD-10-CM | POA: Diagnosis not present

## 2019-10-21 DIAGNOSIS — M9902 Segmental and somatic dysfunction of thoracic region: Secondary | ICD-10-CM | POA: Diagnosis not present

## 2019-10-23 DIAGNOSIS — M4312 Spondylolisthesis, cervical region: Secondary | ICD-10-CM | POA: Diagnosis not present

## 2019-10-23 DIAGNOSIS — M50322 Other cervical disc degeneration at C5-C6 level: Secondary | ICD-10-CM | POA: Diagnosis not present

## 2019-10-23 DIAGNOSIS — M50321 Other cervical disc degeneration at C4-C5 level: Secondary | ICD-10-CM | POA: Diagnosis not present

## 2019-10-23 DIAGNOSIS — M9902 Segmental and somatic dysfunction of thoracic region: Secondary | ICD-10-CM | POA: Diagnosis not present

## 2019-10-23 DIAGNOSIS — M9903 Segmental and somatic dysfunction of lumbar region: Secondary | ICD-10-CM | POA: Diagnosis not present

## 2019-10-23 DIAGNOSIS — M50323 Other cervical disc degeneration at C6-C7 level: Secondary | ICD-10-CM | POA: Diagnosis not present

## 2019-10-23 DIAGNOSIS — M5031 Other cervical disc degeneration,  high cervical region: Secondary | ICD-10-CM | POA: Diagnosis not present

## 2019-10-23 DIAGNOSIS — M531 Cervicobrachial syndrome: Secondary | ICD-10-CM | POA: Diagnosis not present

## 2019-10-24 DIAGNOSIS — E291 Testicular hypofunction: Secondary | ICD-10-CM | POA: Diagnosis not present

## 2019-10-27 DIAGNOSIS — M50322 Other cervical disc degeneration at C5-C6 level: Secondary | ICD-10-CM | POA: Diagnosis not present

## 2019-10-27 DIAGNOSIS — M4312 Spondylolisthesis, cervical region: Secondary | ICD-10-CM | POA: Diagnosis not present

## 2019-10-27 DIAGNOSIS — M5031 Other cervical disc degeneration,  high cervical region: Secondary | ICD-10-CM | POA: Diagnosis not present

## 2019-10-27 DIAGNOSIS — M9903 Segmental and somatic dysfunction of lumbar region: Secondary | ICD-10-CM | POA: Diagnosis not present

## 2019-10-27 DIAGNOSIS — M50323 Other cervical disc degeneration at C6-C7 level: Secondary | ICD-10-CM | POA: Diagnosis not present

## 2019-10-27 DIAGNOSIS — M50321 Other cervical disc degeneration at C4-C5 level: Secondary | ICD-10-CM | POA: Diagnosis not present

## 2019-10-27 DIAGNOSIS — M9902 Segmental and somatic dysfunction of thoracic region: Secondary | ICD-10-CM | POA: Diagnosis not present

## 2019-10-27 DIAGNOSIS — M531 Cervicobrachial syndrome: Secondary | ICD-10-CM | POA: Diagnosis not present

## 2019-10-28 DIAGNOSIS — M9902 Segmental and somatic dysfunction of thoracic region: Secondary | ICD-10-CM | POA: Diagnosis not present

## 2019-10-28 DIAGNOSIS — M50322 Other cervical disc degeneration at C5-C6 level: Secondary | ICD-10-CM | POA: Diagnosis not present

## 2019-10-28 DIAGNOSIS — M531 Cervicobrachial syndrome: Secondary | ICD-10-CM | POA: Diagnosis not present

## 2019-10-28 DIAGNOSIS — M50321 Other cervical disc degeneration at C4-C5 level: Secondary | ICD-10-CM | POA: Diagnosis not present

## 2019-10-28 DIAGNOSIS — M4312 Spondylolisthesis, cervical region: Secondary | ICD-10-CM | POA: Diagnosis not present

## 2019-10-28 DIAGNOSIS — M50323 Other cervical disc degeneration at C6-C7 level: Secondary | ICD-10-CM | POA: Diagnosis not present

## 2019-10-28 DIAGNOSIS — M5031 Other cervical disc degeneration,  high cervical region: Secondary | ICD-10-CM | POA: Diagnosis not present

## 2019-10-28 DIAGNOSIS — M9903 Segmental and somatic dysfunction of lumbar region: Secondary | ICD-10-CM | POA: Diagnosis not present

## 2019-11-01 ENCOUNTER — Other Ambulatory Visit: Payer: Self-pay | Admitting: Family Medicine

## 2019-11-03 DIAGNOSIS — M4312 Spondylolisthesis, cervical region: Secondary | ICD-10-CM | POA: Diagnosis not present

## 2019-11-03 DIAGNOSIS — M50322 Other cervical disc degeneration at C5-C6 level: Secondary | ICD-10-CM | POA: Diagnosis not present

## 2019-11-03 DIAGNOSIS — M9903 Segmental and somatic dysfunction of lumbar region: Secondary | ICD-10-CM | POA: Diagnosis not present

## 2019-11-03 DIAGNOSIS — M50323 Other cervical disc degeneration at C6-C7 level: Secondary | ICD-10-CM | POA: Diagnosis not present

## 2019-11-03 DIAGNOSIS — M50321 Other cervical disc degeneration at C4-C5 level: Secondary | ICD-10-CM | POA: Diagnosis not present

## 2019-11-03 DIAGNOSIS — M531 Cervicobrachial syndrome: Secondary | ICD-10-CM | POA: Diagnosis not present

## 2019-11-03 DIAGNOSIS — M9902 Segmental and somatic dysfunction of thoracic region: Secondary | ICD-10-CM | POA: Diagnosis not present

## 2019-11-03 DIAGNOSIS — M5031 Other cervical disc degeneration,  high cervical region: Secondary | ICD-10-CM | POA: Diagnosis not present

## 2019-11-04 DIAGNOSIS — M9903 Segmental and somatic dysfunction of lumbar region: Secondary | ICD-10-CM | POA: Diagnosis not present

## 2019-11-04 DIAGNOSIS — M5031 Other cervical disc degeneration,  high cervical region: Secondary | ICD-10-CM | POA: Diagnosis not present

## 2019-11-04 DIAGNOSIS — M531 Cervicobrachial syndrome: Secondary | ICD-10-CM | POA: Diagnosis not present

## 2019-11-04 DIAGNOSIS — M50323 Other cervical disc degeneration at C6-C7 level: Secondary | ICD-10-CM | POA: Diagnosis not present

## 2019-11-04 DIAGNOSIS — M4312 Spondylolisthesis, cervical region: Secondary | ICD-10-CM | POA: Diagnosis not present

## 2019-11-04 DIAGNOSIS — M9902 Segmental and somatic dysfunction of thoracic region: Secondary | ICD-10-CM | POA: Diagnosis not present

## 2019-11-04 DIAGNOSIS — M50322 Other cervical disc degeneration at C5-C6 level: Secondary | ICD-10-CM | POA: Diagnosis not present

## 2019-11-04 DIAGNOSIS — M50321 Other cervical disc degeneration at C4-C5 level: Secondary | ICD-10-CM | POA: Diagnosis not present

## 2019-11-06 DIAGNOSIS — M9902 Segmental and somatic dysfunction of thoracic region: Secondary | ICD-10-CM | POA: Diagnosis not present

## 2019-11-06 DIAGNOSIS — M5031 Other cervical disc degeneration,  high cervical region: Secondary | ICD-10-CM | POA: Diagnosis not present

## 2019-11-06 DIAGNOSIS — M4312 Spondylolisthesis, cervical region: Secondary | ICD-10-CM | POA: Diagnosis not present

## 2019-11-06 DIAGNOSIS — M50323 Other cervical disc degeneration at C6-C7 level: Secondary | ICD-10-CM | POA: Diagnosis not present

## 2019-11-06 DIAGNOSIS — M531 Cervicobrachial syndrome: Secondary | ICD-10-CM | POA: Diagnosis not present

## 2019-11-06 DIAGNOSIS — M50322 Other cervical disc degeneration at C5-C6 level: Secondary | ICD-10-CM | POA: Diagnosis not present

## 2019-11-06 DIAGNOSIS — M50321 Other cervical disc degeneration at C4-C5 level: Secondary | ICD-10-CM | POA: Diagnosis not present

## 2019-11-06 DIAGNOSIS — M9903 Segmental and somatic dysfunction of lumbar region: Secondary | ICD-10-CM | POA: Diagnosis not present

## 2019-11-07 ENCOUNTER — Emergency Department (HOSPITAL_COMMUNITY): Payer: Medicare HMO

## 2019-11-07 ENCOUNTER — Emergency Department (HOSPITAL_COMMUNITY)
Admission: EM | Admit: 2019-11-07 | Discharge: 2019-11-07 | Disposition: A | Discharge status: Home / Self Care | Payer: Medicare HMO | Attending: Emergency Medicine | Admitting: Emergency Medicine

## 2019-11-07 DIAGNOSIS — R079 Chest pain, unspecified: Secondary | ICD-10-CM | POA: Diagnosis not present

## 2019-11-07 DIAGNOSIS — Z85828 Personal history of other malignant neoplasm of skin: Secondary | ICD-10-CM | POA: Diagnosis not present

## 2019-11-07 DIAGNOSIS — R1011 Right upper quadrant pain: Secondary | ICD-10-CM | POA: Diagnosis not present

## 2019-11-07 DIAGNOSIS — G9389 Other specified disorders of brain: Secondary | ICD-10-CM | POA: Diagnosis not present

## 2019-11-07 DIAGNOSIS — S299XXA Unspecified injury of thorax, initial encounter: Secondary | ICD-10-CM | POA: Diagnosis not present

## 2019-11-07 DIAGNOSIS — S199XXA Unspecified injury of neck, initial encounter: Secondary | ICD-10-CM | POA: Diagnosis not present

## 2019-11-07 DIAGNOSIS — W11XXXA Fall on and from ladder, initial encounter: Secondary | ICD-10-CM | POA: Insufficient documentation

## 2019-11-07 DIAGNOSIS — T07XXXA Unspecified multiple injuries, initial encounter: Secondary | ICD-10-CM | POA: Diagnosis present

## 2019-11-07 DIAGNOSIS — S065X0A Traumatic subdural hemorrhage without loss of consciousness, initial encounter: Secondary | ICD-10-CM | POA: Diagnosis not present

## 2019-11-07 DIAGNOSIS — Y92018 Other place in single-family (private) house as the place of occurrence of the external cause: Secondary | ICD-10-CM | POA: Diagnosis not present

## 2019-11-07 DIAGNOSIS — S0231XA Fracture of orbital floor, right side, initial encounter for closed fracture: Secondary | ICD-10-CM | POA: Diagnosis not present

## 2019-11-07 DIAGNOSIS — R9431 Abnormal electrocardiogram [ECG] [EKG]: Secondary | ICD-10-CM | POA: Diagnosis not present

## 2019-11-07 DIAGNOSIS — T1490XA Injury, unspecified, initial encounter: Secondary | ICD-10-CM

## 2019-11-07 DIAGNOSIS — Z79899 Other long term (current) drug therapy: Secondary | ICD-10-CM | POA: Diagnosis not present

## 2019-11-07 DIAGNOSIS — E291 Testicular hypofunction: Secondary | ICD-10-CM | POA: Diagnosis not present

## 2019-11-07 DIAGNOSIS — R0689 Other abnormalities of breathing: Secondary | ICD-10-CM | POA: Diagnosis not present

## 2019-11-07 DIAGNOSIS — I10 Essential (primary) hypertension: Secondary | ICD-10-CM | POA: Diagnosis not present

## 2019-11-07 DIAGNOSIS — S0281XA Fracture of other specified skull and facial bones, right side, initial encounter for closed fracture: Secondary | ICD-10-CM | POA: Diagnosis not present

## 2019-11-07 DIAGNOSIS — Y999 Unspecified external cause status: Secondary | ICD-10-CM | POA: Insufficient documentation

## 2019-11-07 DIAGNOSIS — R0789 Other chest pain: Secondary | ICD-10-CM | POA: Diagnosis not present

## 2019-11-07 DIAGNOSIS — S06360A Traumatic hemorrhage of cerebrum, unspecified, without loss of consciousness, initial encounter: Secondary | ICD-10-CM | POA: Diagnosis not present

## 2019-11-07 DIAGNOSIS — Y939 Activity, unspecified: Secondary | ICD-10-CM | POA: Diagnosis not present

## 2019-11-07 DIAGNOSIS — S3993XA Unspecified injury of pelvis, initial encounter: Secondary | ICD-10-CM | POA: Diagnosis not present

## 2019-11-07 DIAGNOSIS — S065XAA Traumatic subdural hemorrhage with loss of consciousness status unknown, initial encounter: Secondary | ICD-10-CM

## 2019-11-07 DIAGNOSIS — S020XXA Fracture of vault of skull, initial encounter for closed fracture: Secondary | ICD-10-CM | POA: Diagnosis not present

## 2019-11-07 DIAGNOSIS — Z7982 Long term (current) use of aspirin: Secondary | ICD-10-CM | POA: Diagnosis not present

## 2019-11-07 DIAGNOSIS — R609 Edema, unspecified: Secondary | ICD-10-CM | POA: Diagnosis not present

## 2019-11-07 DIAGNOSIS — S3991XA Unspecified injury of abdomen, initial encounter: Secondary | ICD-10-CM | POA: Diagnosis not present

## 2019-11-07 DIAGNOSIS — S02121A Fracture of orbital roof, right side, initial encounter for closed fracture: Secondary | ICD-10-CM | POA: Diagnosis not present

## 2019-11-07 DIAGNOSIS — R040 Epistaxis: Secondary | ICD-10-CM | POA: Insufficient documentation

## 2019-11-07 DIAGNOSIS — S065X9A Traumatic subdural hemorrhage with loss of consciousness of unspecified duration, initial encounter: Secondary | ICD-10-CM

## 2019-11-07 LAB — COMPREHENSIVE METABOLIC PANEL
ALT: 18 U/L (ref 0–44)
AST: 29 U/L (ref 15–41)
Albumin: 3.9 g/dL (ref 3.5–5.0)
Alkaline Phosphatase: 41 U/L (ref 38–126)
Anion gap: 12 (ref 5–15)
BUN: 15 mg/dL (ref 8–23)
CO2: 24 mmol/L (ref 22–32)
Calcium: 9.3 mg/dL (ref 8.9–10.3)
Chloride: 102 mmol/L (ref 98–111)
Creatinine, Ser: 1.08 mg/dL (ref 0.61–1.24)
GFR calc Af Amer: >60 mL/min (ref >60)
GFR calc non Af Amer: >60 mL/min (ref >60)
Glucose, Bld: 165 mg/dL — ABNORMAL HIGH (ref 70–99)
Potassium: 4.1 mmol/L (ref 3.5–5.1)
Sodium: 138 mmol/L (ref 135–145)
Total Bilirubin: 0.8 mg/dL (ref 0.3–1.2)
Total Protein: 6.8 g/dL (ref 6.5–8.1)

## 2019-11-07 LAB — I-STAT CHEM 8, ED
BUN: 19 mg/dL (ref 8–23)
Calcium, Ion: 1.18 mmol/L (ref 1.15–1.40)
Chloride: 101 mmol/L (ref 98–111)
Creatinine, Ser: 0.9 mg/dL (ref 0.61–1.24)
Glucose, Bld: 157 mg/dL — ABNORMAL HIGH (ref 70–99)
HCT: 50 % (ref 39.0–52.0)
Hemoglobin: 17 g/dL (ref 13.0–17.0)
Potassium: 4 mmol/L (ref 3.5–5.1)
Sodium: 138 mmol/L (ref 135–145)
TCO2: 27 mmol/L (ref 22–32)

## 2019-11-07 LAB — URINALYSIS, MICROSCOPIC (REFLEX): RBC / HPF: >50 RBC/hpf (ref 0–5)

## 2019-11-07 LAB — CBC
HCT: 51.5 % (ref 39.0–52.0)
Hemoglobin: 16.2 g/dL (ref 13.0–17.0)
MCH: 27.2 pg (ref 26.0–34.0)
MCHC: 31.5 g/dL (ref 30.0–36.0)
MCV: 86.6 fL (ref 80.0–100.0)
Platelets: 273 10*3/uL (ref 150–400)
RBC: 5.95 MIL/uL — ABNORMAL HIGH (ref 4.22–5.81)
RDW: 13.6 % (ref 11.5–15.5)
WBC: 17.8 10*3/uL — ABNORMAL HIGH (ref 4.0–10.5)
nRBC: 0 % (ref 0.0–0.2)

## 2019-11-07 LAB — URINALYSIS, ROUTINE W REFLEX MICROSCOPIC
Glucose, UA: NEGATIVE mg/dL
Ketones, ur: 15 mg/dL — AB
Leukocytes,Ua: NEGATIVE
Nitrite: NEGATIVE
Protein, ur: 30 mg/dL — AB
Specific Gravity, Urine: 1.02 (ref 1.005–1.030)
pH: 5 (ref 5.0–8.0)

## 2019-11-07 LAB — TYPE AND SCREEN
ABO/RH(D): A POS
Antibody Screen: NEGATIVE

## 2019-11-07 LAB — CK TOTAL AND CKMB (NOT AT ARMC)
CK, MB: 8 ng/mL — ABNORMAL HIGH (ref 0.5–5.0)
Relative Index: 2.1 (ref 0.0–2.5)
Total CK: 381 U/L (ref 49–397)

## 2019-11-07 LAB — PROTIME-INR
INR: 1 (ref 0.8–1.2)
Prothrombin Time: 12.8 seconds (ref 11.4–15.2)

## 2019-11-07 LAB — ABO/RH: ABO/RH(D): A POS

## 2019-11-07 LAB — LACTIC ACID, PLASMA: Lactic Acid, Venous: 2 mmol/L (ref 0.5–1.9)

## 2019-11-07 MED ORDER — FENTANYL CITRATE (PF) 100 MCG/2ML IJ SOLN
50.0000 ug | Freq: Once | INTRAMUSCULAR | Status: AC
  Administered 2019-11-07: 50 ug via INTRAVENOUS
  Filled 2019-11-07: qty 2

## 2019-11-07 MED ORDER — IOHEXOL 300 MG/ML  SOLN
100.0000 mL | Freq: Once | INTRAMUSCULAR | Status: AC | PRN
  Administered 2019-11-07: 100 mL via INTRAVENOUS

## 2019-11-07 MED ORDER — OXYCODONE-ACETAMINOPHEN 5-325 MG PO TABS: 1.0000 | ORAL_TABLET | ORAL | 0 refills | Status: DC | PRN

## 2019-11-07 MED ORDER — FENTANYL CITRATE (PF) 100 MCG/2ML IJ SOLN
100.0000 ug | INTRAMUSCULAR | Status: DC | PRN
  Administered 2019-11-07 (×2): 100 ug via INTRAVENOUS
  Filled 2019-11-07 (×2): qty 2

## 2019-11-07 NOTE — ED Provider Notes (Signed)
[facility] EMERGENCY DEPARTMENT Provider Note   CSN: [id number] Arrival date & time:        History No chief complaint on file.   Christian Lowe is a 79 y.o. male.  HPI Reportedly, the patient was on a 4 foot ladder working on an Therapist, occupational on his porch.  He had told medics that he assumed he fell and was not injured.  He was sitting on his porch and he thinks about 45 minutes past, and then he awakened to notice that he had blood running on his face.  At that time he called EMS for transport.  Patient does not take anticoagulants.  No one was present at the time.  He is reporting severe pain in his thoracic back and chest.  He denies feeling significantly short of breath.  He denies weakness or numbness into his legs.  He has a large hematoma on the forehead but is denying severe headache.    Past Medical History:  Diagnosis Date  . Calculus of kidney   . Cancer ([place]) 2015   skin cancer removed off left forearm     There are no problems to display for this patient.   Past Surgical History:  Procedure Laterality Date  . EYE SURGERY Bilateral 2012   cataracts w lens implants  . TEMPORAL ARTERY BIOPSY / LIGATION Bilateral 1997   negative  . TRANSURETHRAL RESECTION OF PROSTATE  2010       No family history on file.  Social History   Tobacco Use  . Smoking status: Former Smoker    Packs/day: 1.00    Years: 20.00    Pack years: 20.00    Types: Cigarettes    Quit date: 01/06/1967    Years since quitting: 52.8  Substance Use Topics  . Alcohol use: Yes    Comment: occassionally  . Drug use: No    Home Medications Prior to Admission medications   Medication Sig Start Date End Date Taking? Authorizing Provider  aspirin 325 MG EC tablet Take 325 mg by mouth as needed for pain.   Yes [provider]  naproxen (NAPROSYN) 500 MG tablet TAKE 1 TABLET BY MOUTH TWICE A DAY WITH FOOD Patient taking differently: Take 500 mg by mouth See admin  instructions. Take 500 mg by mouth one to two times a day as needed for pain 11/02/19  Yes Cox, Kirsten, MD  tolterodine (DETROL LA) 4 MG 24 hr capsule Take 4 mg by mouth daily.   Yes [provider]  benzonatate (TESSALON) 100 MG capsule Take 100 mg by mouth at bedtime as needed for cough.    [provider]  HYDROcodone-acetaminophen (NORCO/VICODIN) 5-325 MG tablet Take 1 tablet by mouth every 6 (six) hours as needed for moderate pain.    [provider]  Lactobacillus (ACIDOPHILUS PO) Take 1 tablet by mouth 2 (two) times daily.    [provider]  omeprazole (PRILOSEC) 20 MG capsule Take 20 mg by mouth daily.     [provider]  Oxycodone HCl 10 MG TABS Take 1 tablet (10 mg total) by mouth every 4 (four) hours as needed. 01/10/16   Kathie Rhodes, MD  tamsulosin (FLOMAX) 0.4 MG CAPS capsule Take 1 capsule (0.4 mg total) by mouth daily after supper. Patient not taking: Reported on 11/07/2019 01/10/16   Kathie Rhodes, MD  trospium (SANCTURA) 20 MG tablet Take 20 mg by mouth 2 (two) times daily.    [provider]  Allergies    Patient has no known allergies.  Review of Systems   Review of Systems 10 Systems reviewed and are negative for acute change except as noted in the HPI. Physical Exam Updated Vital Signs BP (!) 148/86   Pulse 97   Temp 97.6 F (36.4 C) (Oral)   Resp (!) 22   Ht 5\' 11"  (1.803 m)   Wt 102.1 kg   SpO2 96%   BMI 31.38 kg/m   Physical Exam Constitutional:      Comments: Patient is alert with GCS of 15.  Large forehead hematoma.  He has cervical collar in place.  He has a sheet binder on his chest and is holding his hand over his chest he appears to be in severe pain.  HENT:     Head:     Comments: Large greater than 5 cm hematoma right forehead.    Nose:     Comments: Blood in the right nare.  Fresh blood but not actively bleeding.    Mouth/Throat:     Mouth: Mucous membranes are moist.     Pharynx:  Oropharynx is clear.  Eyes:     Extraocular Movements: Extraocular movements intact.     Conjunctiva/sclera: Conjunctivae normal.     Pupils: Pupils are equal, round, and reactive to light.  Neck:     Comments: Cervical collar maintained. Cardiovascular:     Comments: Heart is regular.  Distant heart sounds.  Difficult to auscultate because of respiratory noise. Pulmonary:     Comments: Mild to moderate increased work of breathing.  Breath sounds sound diminished bilaterally.  Patient has audible and repetitive sound of bony movement as he breathes.  Lying my hand on his sternum there is palpable paradoxical movement and shifting. Abdominal:     Comments: Moderate right upper quadrant tenderness.  Abdomen is significantly obese but possibly distended.  No visible contusions or abrasions.  Genitourinary:    Comments: Normal rectal tone. Musculoskeletal:     Comments: Back no step-offs.  Patient is not endorsing focal pain to palpation of the vertebral bodies.  No visible contusions or abrasions to the back.  No deformities of the upper or lower extremities.  Pedal pulses are brisk.  Skin:    General: Skin is warm and dry.  Neurological:     General: No focal deficit present.     Mental Status: He is oriented to person, place, and time.     Cranial Nerves: No cranial nerve deficit.     Coordination: Coordination normal.     Comments: Patient can use both upper extremities at command.  Patient can move his feet and toes at command.  He endorses sensation to light touch of the lower extremities.     ED Results / Procedures / Treatments   Labs (all labs ordered are listed, but only abnormal results are displayed) Labs Reviewed  COMPREHENSIVE METABOLIC PANEL - Abnormal; Notable for the following components:      Result Value   Glucose, Bld 165 (*)    All other components within normal limits  CBC - Abnormal; Notable for the following components:   WBC 17.8 (*)    RBC 5.95 (*)    All  other components within normal limits  LACTIC ACID, PLASMA - Abnormal; Notable for the following components:   Lactic Acid, Venous 2.0 (*)    All other components within normal limits  CK TOTAL AND CKMB (NOT AT [facility]) - Abnormal; Notable for the following components:  CK, MB 8.0 (*)    All other components within normal limits  I-STAT CHEM 8, ED - Abnormal; Notable for the following components:   Glucose, Bld 157 (*)    All other components within normal limits  RESPIRATORY PANEL BY RT PCR (FLU A&B, COVID)  PROTIME-INR  ETHANOL  URINALYSIS, ROUTINE W REFLEX MICROSCOPIC  TYPE AND SCREEN  ABO/RH    EKG EKG Interpretation  Date/Time:  Friday November 07 2019 14:27:47 EDT Ventricular Rate:  88 PR Interval:    QRS Duration: 91 QT Interval:  356 QTC Calculation: 431 R Axis:   -57 Text Interpretation: Sinus rhythm Ventricular premature complex Probable left atrial enlargement LAD, consider left anterior fascicular block Confirmed by Charlesetta Shanks [phone]) on 11/07/2019 4:33:48 PM   Radiology CT HEAD WO CONTRAST  Result Date: 11/07/2019 CLINICAL DATA:  Fall from ladder EXAM: CT HEAD WITHOUT CONTRAST CT MAXILLOFACIAL WITHOUT CONTRAST CT CERVICAL SPINE WITHOUT CONTRAST TECHNIQUE: Multidetector CT imaging of the head, cervical spine, and maxillofacial structures were performed using the standard protocol without intravenous contrast. Multiplanar CT image reconstructions of the cervical spine and maxillofacial structures were also generated. COMPARISON:  None. FINDINGS: CT HEAD FINDINGS Brain: Tiny focus of subdural hemorrhage about the right frontal lobe measuring approximately 5 mm (series 1, image 21, series 5, image 21). Mild periventricular white matter hypodensity. Vascular: No hyperdense vessel or unexpected calcification. CT FACIAL BONES FINDINGS Skull: Nondisplaced linear fracture of the right lateral frontal bone extending into the lateral margin and roof of the right orbit (series 3,  image 79, 74). There are minimally displaced fractures of the right orbital roof, with tiny foci of pneumocephalus (series 5, image 34). Facial bones: No displaced fractures or dislocations. Sinuses/Orbits: Mucosal thickening and partial opacification of the right frontal sinus, ethmoid air cells, and right maxillary sinus, likely related to sinus disease. No displaced fractures noted. Other: Large soft tissue hematoma of the right forehead. CT CERVICAL SPINE FINDINGS Alignment: Straightening of the normal cervical lordosis. Skull base and vertebrae: No acute fracture. No primary bone lesion or focal pathologic process. Soft tissues and spinal canal: No prevertebral fluid or swelling. No visible canal hematoma. Disc levels: Mild to moderate disc space height loss and osteophytosis. Upper chest: Negative. Other: None. IMPRESSION: 1. Tiny focus of subdural hemorrhage about the right frontal lobe measuring approximately 5 mm. 2. Overlying nondisplaced linear fracture of the right lateral frontal bone extending into the lateral margin and roof of the right orbit. There are minimally displaced fractures of the right orbital roof, with tiny foci of pneumocephalus. 3.  Large soft tissue hematoma of the right forehead. 4.  No fracture or static subluxation of the cervical spine. Findings discussed by telephone with Dr. Bobbye Morton at 3:50 p.m., 11/07/2019. Electronically Signed   By: Eddie Candle M.D.   On: 11/07/2019 15:50   CT Chest W Contrast  Result Date: 11/07/2019 CLINICAL DATA:  Fall from ladder, pain EXAM: CT CHEST, ABDOMEN, AND PELVIS WITH CONTRAST TECHNIQUE: Multidetector CT imaging of the chest, abdomen and pelvis was performed following the standard protocol during bolus administration of intravenous contrast. CONTRAST:  160mL OMNIPAQUE IOHEXOL 300 MG/ML  SOLN COMPARISON:  CT abdomen pelvis, 05/14/2019 FINDINGS: CT CHEST FINDINGS Cardiovascular: Aortic atherosclerosis. Mild cardiomegaly. Three-vessel coronary  artery calcifications. Trace pericardial effusion, unchanged compared to prior examination. Mediastinum/Nodes: No enlarged mediastinal, hilar, or axillary lymph nodes. Calcified left hilar lymph nodes, in keeping with prior granulomatous infection. Thyroid gland, trachea, and esophagus demonstrate no significant findings. Lungs/Pleura:  Lungs are clear. No pleural effusion or pneumothorax. Musculoskeletal: No chest wall mass or suspicious bone lesions identified. CT ABDOMEN PELVIS FINDINGS Hepatobiliary: No solid liver abnormality is seen. No gallstones, gallbladder wall thickening, or biliary dilatation. Pancreas: Unremarkable. No pancreatic ductal dilatation or surrounding inflammatory changes. Spleen: Normal in size without significant abnormality. Adrenals/Urinary Tract: Adrenal glands are unremarkable. Large exophytic cyst of the inferior pole of the right kidney. Tiny nonobstructive calculi bilaterally. Bladder is unremarkable. Stomach/Bowel: Stomach is within normal limits. Appendix appears normal. No evidence of bowel wall thickening, distention, or inflammatory changes. Vascular/Lymphatic: Aortic atherosclerosis. No enlarged abdominal or pelvic lymph nodes. Reproductive: No mass or other abnormality. Other: No abdominal wall hernia or abnormality. No abdominopelvic ascites. Musculoskeletal: No acute or significant osseous findings. IMPRESSION: 1. No evidence of acute traumatic injury to the chest, abdomen, or pelvis. 2.  Nonobstructive bilateral nephrolithiasis. 3.  Coronary artery disease.  Aortic Atherosclerosis (ICD10-I70.0). Findings discussed by telephone with Dr. Bobbye Morton, 3:50 p.m., 11/07/2019. Electronically Signed   By: Eddie Candle M.D.   On: 11/07/2019 15:54   CT CERVICAL SPINE WO CONTRAST  Result Date: 11/07/2019 CLINICAL DATA:  Fall from ladder EXAM: CT HEAD WITHOUT CONTRAST CT MAXILLOFACIAL WITHOUT CONTRAST CT CERVICAL SPINE WITHOUT CONTRAST TECHNIQUE: Multidetector CT imaging of the head,  cervical spine, and maxillofacial structures were performed using the standard protocol without intravenous contrast. Multiplanar CT image reconstructions of the cervical spine and maxillofacial structures were also generated. COMPARISON:  None. FINDINGS: CT HEAD FINDINGS Brain: Tiny focus of subdural hemorrhage about the right frontal lobe measuring approximately 5 mm (series 1, image 21, series 5, image 21). Mild periventricular white matter hypodensity. Vascular: No hyperdense vessel or unexpected calcification. CT FACIAL BONES FINDINGS Skull: Nondisplaced linear fracture of the right lateral frontal bone extending into the lateral margin and roof of the right orbit (series 3, image 79, 74). There are minimally displaced fractures of the right orbital roof, with tiny foci of pneumocephalus (series 5, image 34). Facial bones: No displaced fractures or dislocations. Sinuses/Orbits: Mucosal thickening and partial opacification of the right frontal sinus, ethmoid air cells, and right maxillary sinus, likely related to sinus disease. No displaced fractures noted. Other: Large soft tissue hematoma of the right forehead. CT CERVICAL SPINE FINDINGS Alignment: Straightening of the normal cervical lordosis. Skull base and vertebrae: No acute fracture. No primary bone lesion or focal pathologic process. Soft tissues and spinal canal: No prevertebral fluid or swelling. No visible canal hematoma. Disc levels: Mild to moderate disc space height loss and osteophytosis. Upper chest: Negative. Other: None. IMPRESSION: 1. Tiny focus of subdural hemorrhage about the right frontal lobe measuring approximately 5 mm. 2. Overlying nondisplaced linear fracture of the right lateral frontal bone extending into the lateral margin and roof of the right orbit. There are minimally displaced fractures of the right orbital roof, with tiny foci of pneumocephalus. 3.  Large soft tissue hematoma of the right forehead. 4.  No fracture or static  subluxation of the cervical spine. Findings discussed by telephone with Dr. Bobbye Morton at 3:50 p.m., 11/07/2019. Electronically Signed   By: Eddie Candle M.D.   On: 11/07/2019 15:50   CT ABDOMEN PELVIS W CONTRAST  Result Date: 11/07/2019 CLINICAL DATA:  Fall from ladder, pain EXAM: CT CHEST, ABDOMEN, AND PELVIS WITH CONTRAST TECHNIQUE: Multidetector CT imaging of the chest, abdomen and pelvis was performed following the standard protocol during bolus administration of intravenous contrast. CONTRAST:  127mL OMNIPAQUE IOHEXOL 300 MG/ML  SOLN COMPARISON:  05/14/2019 FINDINGS:  CT CHEST FINDINGS Cardiovascular: Aortic atherosclerosis. Mild cardiomegaly. Three-vessel coronary artery calcifications. Trace pericardial effusion, unchanged compared to prior examination. Mediastinum/Nodes: No enlarged mediastinal, hilar, or axillary lymph nodes. Calcified left hilar lymph nodes, in keeping with prior granulomatous infection. Thyroid gland, trachea, and esophagus demonstrate no significant findings. Lungs/Pleura: Lungs are clear. No pleural effusion or pneumothorax. Musculoskeletal: No chest wall mass or suspicious bone lesions identified. CT ABDOMEN PELVIS FINDINGS Hepatobiliary: No solid liver abnormality is seen. No gallstones, gallbladder wall thickening, or biliary dilatation. Pancreas: Unremarkable. No pancreatic ductal dilatation or surrounding inflammatory changes. Spleen: Normal in size without significant abnormality. Adrenals/Urinary Tract: Adrenal glands are unremarkable. Large exophytic cyst of the inferior pole of the right kidney. Tiny nonobstructive calculi bilaterally. Bladder is unremarkable. Stomach/Bowel: Stomach is within normal limits. Appendix appears normal. No evidence of bowel wall thickening, distention, or inflammatory changes. Vascular/Lymphatic: Aortic atherosclerosis. No enlarged abdominal or pelvic lymph nodes. Reproductive: No mass or other abnormality. Other: No abdominal wall hernia or  abnormality. No abdominopelvic ascites. Musculoskeletal: No acute or significant osseous findings. IMPRESSION: 1. No evidence of acute traumatic injury to the chest, abdomen, or pelvis. 2. Nonobstructive bilateral nephrolithiasis. 3. Coronary artery disease. Aortic Atherosclerosis (ICD10-I70.0). Findings discussed by telephone with Dr. Bobbye Morton, 3:50 p.m., 11/07/2019. Electronically Signed   By: Eddie Candle M.D.   On: 11/07/2019 16:00   DG Pelvis Portable  Result Date: 11/07/2019 CLINICAL DATA:  Fall from 4 ft ladder. Right-sided pain. EXAM: PORTABLE PELVIS 1-2 VIEWS COMPARISON:  CT urogram 05/14/2019. FINDINGS: No evidence of acute fracture or dislocation. The hip and sacroiliac joint spaces are preserved. There are moderate degenerative changes within the lower lumbar spine. No focal soft tissue abnormalities. IMPRESSION: No evidence of acute pelvic injury. Electronically Signed   By: Richardean Sale M.D.   On: 11/07/2019 14:37   DG Chest Port 1 View  Result Date: 11/07/2019 CLINICAL DATA:  Trauma.  Fall. EXAM: PORTABLE CHEST 1 VIEW COMPARISON:  08/01/2018. FINDINGS: Cardiomegaly with mild pulmonary venous congestion. Low lung volumes with mild bibasilar atelectasis. No pleural effusion or pneumothorax. Degenerative change thoracic spine. No acute bony abnormality identified. IMPRESSION: 1.  Cardiomegaly with mild pulmonary venous congestion. 2.  Low lung volumes with mild bibasilar atelectasis. Electronically Signed   By: Marcello Moores  Register   On: 11/07/2019 14:36   CT MAXILLOFACIAL WO CONTRAST  Result Date: 11/07/2019 CLINICAL DATA:  Fall from ladder EXAM: CT HEAD WITHOUT CONTRAST CT MAXILLOFACIAL WITHOUT CONTRAST CT CERVICAL SPINE WITHOUT CONTRAST TECHNIQUE: Multidetector CT imaging of the head, cervical spine, and maxillofacial structures were performed using the standard protocol without intravenous contrast. Multiplanar CT image reconstructions of the cervical spine and maxillofacial structures were  also generated. COMPARISON:  None. FINDINGS: CT HEAD FINDINGS Brain: Tiny focus of subdural hemorrhage about the right frontal lobe measuring approximately 5 mm (series 1, image 21, series 5, image 21). Mild periventricular white matter hypodensity. Vascular: No hyperdense vessel or unexpected calcification. CT FACIAL BONES FINDINGS Skull: Nondisplaced linear fracture of the right lateral frontal bone extending into the lateral margin and roof of the right orbit (series 3, image 79, 74). There are minimally displaced fractures of the right orbital roof, with tiny foci of pneumocephalus (series 5, image 34). Facial bones: No displaced fractures or dislocations. Sinuses/Orbits: Mucosal thickening and partial opacification of the right frontal sinus, ethmoid air cells, and right maxillary sinus, likely related to sinus disease. No displaced fractures noted. Other: Large soft tissue hematoma of the right forehead. CT CERVICAL SPINE FINDINGS Alignment:  Straightening of the normal cervical lordosis. Skull base and vertebrae: No acute fracture. No primary bone lesion or focal pathologic process. Soft tissues and spinal canal: No prevertebral fluid or swelling. No visible canal hematoma. Disc levels: Mild to moderate disc space height loss and osteophytosis. Upper chest: Negative. Other: None. IMPRESSION: 1. Tiny focus of subdural hemorrhage about the right frontal lobe measuring approximately 5 mm. 2. Overlying nondisplaced linear fracture of the right lateral frontal bone extending into the lateral margin and roof of the right orbit. There are minimally displaced fractures of the right orbital roof, with tiny foci of pneumocephalus. 3.  Large soft tissue hematoma of the right forehead. 4.  No fracture or static subluxation of the cervical spine. Findings discussed by telephone with Dr. Bobbye Morton at 3:50 p.m., 11/07/2019. Electronically Signed   By: Eddie Candle M.D.   On: 11/07/2019 15:50    Procedures Procedures  (including critical care time) CRITICAL CARE Performed by: Charlesetta Shanks   Total critical care time: 30 minutes  Critical care time was exclusive of separately billable procedures and treating other patients.  Critical care was necessary to treat or prevent imminent or life-threatening deterioration.  Critical care was time spent personally by me on the following activities: development of treatment plan with patient and/or surrogate as well as nursing, discussions with consultants, evaluation of patient's response to treatment, examination of patient, obtaining history from patient or surrogate, ordering and performing treatments and interventions, ordering and review of laboratory studies, ordering and review of radiographic studies, pulse oximetry and re-evaluation of patient's condition. Medications Ordered in ED Medications  fentaNYL (SUBLIMAZE) injection 100 mcg (100 mcg Intravenous Given 11/07/19 1602)  iohexol (OMNIPAQUE) 300 MG/ML solution 100 mL (100 mLs Intravenous Contrast Given 11/07/19 1521)    ED Course  I have reviewed the triage vital signs and the nursing notes.  Pertinent labs & imaging results that were available during my care of the patient were reviewed by me and considered in my medical decision making (see chart for details).    MDM Rules/Calculators/A&P                     Consult: Reviewed with Dr. Tomasita Morrow for trauma.  Patient made trauma level 2.  Patient had fall as outlined above.  This was unwitnessed.  On arrival patient had significant forehead hematoma with suspected loss of consciousness.  CT head and neck ordered.  Patient also had palpable crunching and paradoxical movement of the sternum.  The chest x-ray was reviewed by myself at bedside with no obvious pneumothorax or obvious rib fractures.  Patient cleared for CT scan.  Pain control given with fentanyl.  Dr. Sherry Ruffing to follow-up on CT results and final admission to surgery service. Final Clinical  Impression(s) / ED Diagnoses Final diagnoses:  Trauma  Trauma    Rx / DC Orders ED Discharge Orders    None       Charlesetta Shanks, MD 11/07/19 1635

## 2019-11-07 NOTE — H&P (Signed)
TRAUMA H&P  11/07/2019, 3:19 PM   Chief Complaint: Level 2 trauma activation for fall from height Consultant: Pfeiffer, MD  The patient is an 79 y.o. male.   HPI: 49M s/p fall off of ladder after losing his balance. Reports his wife told him not to get on the ladder, but he has "been on ladder his whole life" and there was nobody else who could install his gazebo. Denies LOC. Reports pain of R face and sternum. Uses naproxen occasionally, but on no blood thinners.   Past Medical History:  Diagnosis Date  . Calculus of kidney   . Cancer ([place]) 2015   skin cancer removed off left forearm     Past Surgical History:  Procedure Laterality Date  . EYE SURGERY Bilateral 2012   cataracts w lens implants  . TEMPORAL ARTERY BIOPSY / LIGATION Bilateral 1997   negative  . TRANSURETHRAL RESECTION OF PROSTATE  2010    No pertinent family history.  Social History:  reports that he quit smoking about 52 years ago. His smoking use included cigarettes. He has a 20.00 pack-year smoking history. He does not have any smokeless tobacco history on file. He reports current alcohol use. He reports that he does not use drugs.    Allergies: No Known Allergies  Medications: reviewed  Results for orders placed or performed during the hospital encounter of 11/07/19 (from the past 48 hour(s))  Comprehensive metabolic panel     Status: Abnormal   Collection Time: 11/07/19  2:14 PM  Result Value Ref Range   Sodium 138 135 - 145 mmol/L   Potassium 4.1 3.5 - 5.1 mmol/L   Chloride 102 98 - 111 mmol/L   CO2 24 22 - 32 mmol/L   Glucose, Bld 165 (H) 70 - 99 mg/dL    Comment: Glucose reference range applies only to samples taken after fasting for at least 8 hours.   BUN 15 8 - 23 mg/dL   Creatinine, Ser 1.08 0.61 - 1.24 mg/dL   Calcium 9.3 8.9 - 10.3 mg/dL   Total Protein 6.8 6.5 - 8.1 g/dL   Albumin 3.9 3.5 - 5.0 g/dL   AST 29 15 - 41 U/L   ALT 18 0 - 44 U/L   Alkaline Phosphatase 41 38 - 126 U/L   Total Bilirubin 0.8 0.3 - 1.2 mg/dL   GFR calc non Af Amer >60 >60 mL/min   GFR calc Af Amer >60 >60 mL/min   Anion gap 12 5 - 15    Comment: Performed at [facility], [place] [address] [place], [place] [address]  CBC     Status: Abnormal   Collection Time: 11/07/19  2:14 PM  Result Value Ref Range   WBC 17.8 (H) 4.0 - 10.5 K/uL   RBC 5.95 (H) 4.22 - 5.81 MIL/uL   Hemoglobin 16.2 13.0 - 17.0 g/dL   HCT 51.5 39.0 - 52.0 %   MCV 86.6 80.0 - 100.0 fL   MCH 27.2 26.0 - 34.0 pg   MCHC 31.5 30.0 - 36.0 g/dL   RDW 13.6 11.5 - 15.5 %   Platelets 273 150 - 400 K/uL   nRBC 0.0 0.0 - 0.2 %    Comment: Performed at [facility], [place] [address], [place], [place] [address]  Protime-INR     Status: None   Collection Time: 11/07/19  2:14 PM  Result Value Ref Range   Prothrombin Time 12.8 11.4 - 15.2 seconds   INR  1.0 0.8 - 1.2    Comment: (NOTE) INR goal varies based on device and disease states. Performed at [facility], [place] [address], [place], [place] [address]   CK total and CKMB (cardiac)not at [facility]     Status: Abnormal   Collection Time: 11/07/19  2:14 PM  Result Value Ref Range   Total CK 381 49 - 397 U/L   CK, MB 8.0 (H) 0.5 - 5.0 ng/mL   Relative Index 2.1 0.0 - 2.5    Comment: Performed at [facility] [address], [place], [place] [address]  Type and screen [place]     Status: None (Preliminary result)   Collection Time: 11/07/19  2:15 PM  Result Value Ref Range   ABO/RH(D) PENDING    Antibody Screen PENDING    Sample Expiration      11/10/2019,2359 Performed at [facility], [place] [address], [place], [place] [address]   I-Stat Chem 8, ED     Status: Abnormal   Collection Time: 11/07/19  2:52 PM  Result Value Ref Range   Sodium 138 135 - 145 mmol/L   Potassium 4.0 3.5 - 5.1 mmol/L   Chloride 101 98 - 111 mmol/L   BUN 19 8 - 23 mg/dL   Creatinine, Ser 0.90 0.61 - 1.24 mg/dL   Glucose, Bld 157 (H) 70 - 99 mg/dL     Comment: Glucose reference range applies only to samples taken after fasting for at least 8 hours.   Calcium, Ion 1.18 1.15 - 1.40 mmol/L   TCO2 27 22 - 32 mmol/L   Hemoglobin 17.0 13.0 - 17.0 g/dL   HCT 50.0 39.0 - 52.0 %    DG Pelvis Portable  Result Date: 11/07/2019 CLINICAL DATA:  Fall from 4 ft ladder. Right-sided pain. EXAM: PORTABLE PELVIS 1-2 VIEWS COMPARISON:  CT urogram 05/14/2019. FINDINGS: No evidence of acute fracture or dislocation. The hip and sacroiliac joint spaces are preserved. There are moderate degenerative changes within the lower lumbar spine. No focal soft tissue abnormalities. IMPRESSION: No evidence of acute pelvic injury. Electronically Signed   By: Richardean Sale M.D.   On: 11/07/2019 14:37   DG Chest Port 1 View  Result Date: 11/07/2019 CLINICAL DATA:  Trauma.  Fall. EXAM: PORTABLE CHEST 1 VIEW COMPARISON:  08/01/2018. FINDINGS: Cardiomegaly with mild pulmonary venous congestion. Low lung volumes with mild bibasilar atelectasis. No pleural effusion or pneumothorax. Degenerative change thoracic spine. No acute bony abnormality identified. IMPRESSION: 1.  Cardiomegaly with mild pulmonary venous congestion. 2.  Low lung volumes with mild bibasilar atelectasis. Electronically Signed   By: Marcello Moores  Register   On: 11/07/2019 14:36    ROS 10 point review of systems is negative except as listed above in HPI.  There were no vitals taken for this visit.  Secondary Survey:  GCS: E(4)//V(5)//M(6) Constitutional: well-developed, well-nourished Skull: normocephalic, atraumatic Eyes: pupils equal, round, reactive to light, 81mm b/l, moist conjunctiva Face/ENT: midface stable without deformity, normal dentition, external inspection of ears and nose normal, hearing intact Oropharynx: normal oropharyngeal mucosa, no blood, intubated Neck: no thyromegaly, trachea midline, c-collar in place on arrival, + midline cervical tenderness to palpation, no C-spine stepoffs Chest: breath  sounds equal bilaterally, normal respiratory effort, + midline chest wall tenderness to palpation at the xiphoid, no lateral chest wall tenderness/deformity Abdomen: soft, NT, no bruising, no hepatosplenomegaly FAST: not performed Pelvis: stable GU: no blood at urethral meatus of penis, no scrotal masses or  abnormality Back: no wounds, no T/L spine TTP, no T/L spine stepoffs Rectal: deferred Extremities: 2+ radial and pedal pulses bilaterally, motor and sensation intact to bilateral UE and LE, no peripheral edema MSK: unable to assess gait/station, no clubbing/cyanosis of fingers/toes, normal ROM of all four extremities, no deformities Skin: warm, dry, no rashes   Assessment/Plan: Problem List FFH  Plan Tiny R SDH - NSGY c/s (Dr. Ellene Route), no need for repeat CT head, cleared for discharge R frontal bone fracture with extension into R orbital roof - ENT c/s (Dr. Mingo Amber), may f/u as o/p FEN - regular diet Dispo - offered the option for admission vs discharge and patient elects for discharge. Will plan for discharge after ambulation and pain control. Counseled wife on the need to monitor for behavioral changes, excessive sleepiness, headache, etc.   Family update: wife at bedside  Jesusita Oka, MD General and [facility]

## 2019-11-07 NOTE — ED Notes (Signed)
Trauma MD bedside.

## 2019-11-07 NOTE — Discharge Instructions (Signed)
Your work-up today revealed evidence of traumatic injuries in your head including a frontal bone fracture, small amount of bleeding, subdural hematoma, small amount of air called pneumocephalus, and an orbital roof fracture.  The trauma team spoke to both the neurosurgery team and the facial trauma team with plastic surgery/ENT who feels you are safe for discharge home if you are feeling better.  After letting you eat and drink and ambulate, we feel you are safe to try going home after the shared decision-making conversation with trauma surgery together.  Please use the pain medicine and rest and stay hydrated.  Please follow-up with the ENT team and call them to schedule follow-up in the next week.  If any symptoms change or worsen, please return to the nearest emergency department immediately.

## 2019-11-07 NOTE — ED Provider Notes (Signed)
4:49 PM Care assumed from Dr. Vallery Ridge.  At time of transfer care, patient is awaiting results of trauma surgery after the imaging is completed.  Imaging revealed facial fracture and skull fracture as well as small amount of hemorrhage and pneumocephalus.  Per trauma surgery whom I spoke with with the family and patient, the consultants feel that he is safe for discharge home and follow-up with ENT as an outpatient.  No need for neurosurgery follow-up based on their report.  Trauma was having a shared decision made conversation with patient and wife about discharge versus admission.  They are amenable to try eating and drinking and ambulation and if he passes and feels well, discharged with prescription for pain medication and very strict return precautions.  Anticipate reassessment after the ambulation and p.o. trial.  Patient was able to ambulate and eat and drink without difficulty.  Patient given a dose of pain medicine.  He and family understand extremely strict return precautions and follow-up instructions.  They had no questions or concerns and was discharged in good condition.  Clinical Impression: 1. Closed fracture of frontal bone, initial encounter ([place])   2. Trauma   3. Closed fracture of roof of right orbit, initial encounter ([place])   4. Subdural hematoma ([place])   5. Pneumocephalus, traumatic     Disposition: Discharge  Condition: Good  I have discussed the results, Dx and Tx plan with the pt(& family if present). He/she/they expressed understanding and agree(s) with the plan. Discharge instructions discussed at great length. Strict return precautions discussed and pt &/or family have verbalized understanding of the instructions. No further questions at time of discharge.    Discharge Medication List as of 11/07/2019  6:45 PM     START taking these medications   Details  oxyCODONE-acetaminophen (PERCOCET/ROXICET) 5-325 MG tablet Take 1 tablet by mouth every 4 (four) hours as needed  for severe pain., Starting Fri 11/07/2019, Normal        Follow Up: Cindra Presume, MD [address] [place] [address] [phone]     [facility] EMERGENCY DEPARTMENT [id number] [place] [phone]    Surgery, [place] [address] [place] [address] [phone]        Joncarlos Atkison, Gwenyth Allegra, MD 11/07/19 [phone]

## 2019-11-10 ENCOUNTER — Ambulatory Visit (INDEPENDENT_AMBULATORY_CARE_PROVIDER_SITE_OTHER): Payer: Medicare HMO | Admitting: Family Medicine

## 2019-11-10 ENCOUNTER — Encounter: Payer: Self-pay | Admitting: Family Medicine

## 2019-11-10 ENCOUNTER — Other Ambulatory Visit: Payer: Self-pay

## 2019-11-10 VITALS — BP 160/82 | HR 74 | Temp 97.3°F | Ht 71.0 in | Wt 229.0 lb

## 2019-11-10 DIAGNOSIS — S020XXA Fracture of vault of skull, initial encounter for closed fracture: Secondary | ICD-10-CM

## 2019-11-10 DIAGNOSIS — M25511 Pain in right shoulder: Secondary | ICD-10-CM

## 2019-11-10 DIAGNOSIS — S2222XA Fracture of body of sternum, initial encounter for closed fracture: Secondary | ICD-10-CM

## 2019-11-10 DIAGNOSIS — S065X9A Traumatic subdural hemorrhage with loss of consciousness of unspecified duration, initial encounter: Secondary | ICD-10-CM

## 2019-11-10 DIAGNOSIS — S7011XA Contusion of right thigh, initial encounter: Secondary | ICD-10-CM

## 2019-11-10 DIAGNOSIS — S0285XA Fracture of orbit, unspecified, initial encounter for closed fracture: Secondary | ICD-10-CM

## 2019-11-10 DIAGNOSIS — S065X0A Traumatic subdural hemorrhage without loss of consciousness, initial encounter: Secondary | ICD-10-CM | POA: Diagnosis not present

## 2019-11-10 DIAGNOSIS — S065XAA Traumatic subdural hemorrhage with loss of consciousness status unknown, initial encounter: Secondary | ICD-10-CM

## 2019-11-10 HISTORY — DX: Contusion of right thigh, initial encounter: S70.11XA

## 2019-11-10 HISTORY — DX: Fracture of body of sternum, initial encounter for closed fracture: S22.22XA

## 2019-11-10 MED ORDER — OXYCODONE-ACETAMINOPHEN 5-325 MG PO TABS: 1.0000 | ORAL_TABLET | ORAL | 0 refills | Status: DC | PRN

## 2019-11-10 NOTE — Progress Notes (Signed)
Established Patient Office Visit  Subjective:  Patient ID: Christian Lowe, male    DOB: Jan 23, 1941  Age: 79 y.o. MRN: [id number]  CC:  Chief Complaint  Patient presents with  . Fall    Patient states while putting a roof on a gazebo he fell off of a 6 ft ladder and loss conscienceness    HPI Christian Lowe presents for follow up of ED visit for fall from ladder. Patient was standing on a 6 foot ladder to put a roof on a gazebo and fell hitting the right side of his body. He did have LOC. He is unsure how long. He did call EMS who took him to ED. HE had a ct scan of his brain, cspine, face, chest, and abdomen. He was found to have a fracture of his frontal bone and right upper orbit, as well as his sternum. He had a subdural hematoma. He is complaining of pain with deep breaths over sternum. He hurts in his lower thoracic back, right shoulder, face, and right lateral thigh (hematoma.) He was given percocet #12 and told to hold his naproxen until he sees me. Labs were   Past Medical History:  Diagnosis Date  . BPPV (benign paroxysmal positional vertigo)   . Calculus of kidney   . Calculus of kidney   . Cancer ([place]) 2015   skin cancer removed off left forearm   . GERD (gastroesophageal reflux disease)   . History of basal cell carcinoma ([facility])   . Mixed hyperlipidemia   . Osteoarthritis   . Prediabetes   . Vitamin D deficiency     Past Surgical History:  Procedure Laterality Date  . EYE SURGERY Bilateral 2012   cataracts w lens implants  . PROSTATECTOMY  2008   BPH  . 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 TEMPORAL ARTERY BIOPSY / LIGATION Bilateral 1997   negative  . TRANSURETHRAL RESECTION OF PROSTATE  2010    History reviewed. No pertinent family history.  Social History   Socioeconomic History  . Marital status: Widowed    Spouse name: Not on file  . Number of children: Not on file  . Years of education: Not on file  . Highest education level: Not on  file  Occupational History  . Not on file  Tobacco Use  . Smoking status: Former Smoker    Packs/day: 1.00    Years: 20.00    Pack years: 20.00    Types: Cigarettes    Quit date: 01/06/1967    Years since quitting: 52.8  . Smokeless tobacco: Never Used  Substance and Sexual Activity  . Alcohol use: Yes    Comment: occassionally  . Drug use: No  . Sexual activity: Not on file  Other Topics Concern  . Not on file  Social History Narrative  . Not on file   Social Determinants of Health   Financial Resource Strain:   . Difficulty of Paying Living Expenses:   Food Insecurity:   . Worried About Charity fundraiser in the Last Year:   . Arboriculturist in the Last Year:   Transportation Needs:   . Film/video editor (Medical):   Marland Kitchen Lack of Transportation (Non-Medical):   Physical Activity:   . Days of Exercise per Week:   . Minutes of Exercise per Session:   Stress:   . Feeling of Stress :   Social Connections:   . Frequency of Communication with Friends and Family:   .  Frequency of Social Gatherings with Friends and Family:   . Attends Religious Services:   . Active Member of Clubs or Organizations:   . Attends Archivist Meetings:   Marland Kitchen Marital Status:   Intimate Partner Violence:   . Fear of Current or Ex-Partner:   . Emotionally Abused:   Marland Kitchen Physically Abused:   . Sexually Abused:     Outpatient Medications Prior to Visit  Medication Sig Dispense Refill  . aspirin 325 MG EC tablet Take 325 mg by mouth as needed for pain (or headaches).     . Cholecalciferol (VITAMIN D3) 25 MCG (1000 UT) CAPS Take by mouth.    . Fluticasone-Umeclidin-Vilant (TRELEGY ELLIPTA) 100-62.5-25 MCG/INH AEPB Inhale into the lungs.    . Lactobacillus (ACIDOPHILUS PO) Take 1 capsule by mouth 2 (two) times daily.     . naproxen (NAPROSYN) 500 MG tablet TAKE 1 TABLET BY MOUTH TWICE A DAY WITH FOOD (Patient taking differently: Take 500 mg by mouth See admin instructions. Take 500 mg by  mouth one to two times a day as needed for pain) 60 tablet 0  . Omega-3 Fatty Acids (FISH OIL) 1200 MG CAPS Take by mouth.    Marland Kitchen omeprazole (PRILOSEC) 20 MG capsule Take 20 mg by mouth daily before breakfast.     . pregabalin (LYRICA) 150 MG capsule Take 150 mg by mouth 2 (two) times daily.    . sildenafil (REVATIO) 20 MG tablet Take 20 mg by mouth 3 (three) times daily.    . Testosterone Cypionate 200 MG/ML KIT Inject into the muscle. 1 ml every 2 weeks    . Oxycodone HCl 10 MG TABS Take 1 tablet (10 mg total) by mouth every 4 (four) hours as needed. (Patient not taking: Reported on 11/07/2019) 30 tablet 0  . oxyCODONE-acetaminophen (PERCOCET/ROXICET) 5-325 MG tablet Take 1 tablet by mouth every 4 (four) hours as needed for severe pain. 15 tablet 0  . tamsulosin (FLOMAX) 0.4 MG CAPS capsule Take 1 capsule (0.4 mg total) by mouth daily after supper. (Patient not taking: Reported on 11/07/2019) 30 capsule 0  . tolterodine (DETROL LA) 4 MG 24 hr capsule Take 4 mg by mouth in the morning.      No facility-administered medications prior to visit.    Allergies  Allergen Reactions  . Statins     myalgias    ROS Review of Systems  Constitutional: Negative for chills, diaphoresis, fatigue and fever.  HENT: Negative for congestion and ear pain.   Cardiovascular: Positive for chest pain (fx sternum).  Musculoskeletal: Positive for arthralgias. Negative for myalgias.       Right hip and shoulder  Neurological: Negative for dizziness and headaches.      Objective:    Physical Exam  Constitutional: He appears well-developed and well-nourished.  Eyes:    Cardiovascular: Normal rate, regular rhythm and normal heart sounds.  Pulmonary/Chest: Breath sounds normal. No respiratory distress.  Musculoskeletal:     Comments: Poor ROM of right shoulder. Unable to abduct past 90 degrees. Unable to internally or externally rotate past 90 degrees. Significant discomfort/pain.  Right lateral thigh -  tender.  Thoracic back mildly tender.   Neurological: He is alert.  Psychiatric: He has a normal mood and affect. His behavior is normal.    BP (!) 160/82 (BP Location: Left Arm, Patient Position: Sitting)   Pulse 74   Temp (!) 97.3 F (36.3 C) (Temporal)   Ht 5' 11"  (1.803 m)   Wt 229  lb (103.9 kg)   SpO2 97%   BMI 31.94 kg/m  Wt Readings from Last 3 Encounters:  11/10/19 229 lb (103.9 kg)  11/07/19 225 lb (102.1 kg)  01/10/16 227 lb 2 oz (103 kg)    No results found for: TSH Lab Results  Component Value Date   WBC 17.8 (H) 11/07/2019   HGB 17.0 11/07/2019   HCT 50.0 11/07/2019   MCV 86.6 11/07/2019   PLT 273 11/07/2019   Lab Results  Component Value Date   NA 138 11/07/2019   K 4.0 11/07/2019   CO2 24 11/07/2019   GLUCOSE 157 (H) 11/07/2019   BUN 19 11/07/2019   CREATININE 0.90 11/07/2019   BILITOT 0.8 11/07/2019   ALKPHOS 41 11/07/2019   AST 29 11/07/2019   ALT 18 11/07/2019   PROT 6.8 11/07/2019   ALBUMIN 3.9 11/07/2019   CALCIUM 9.3 11/07/2019   ANIONGAP 12 11/07/2019   No results found for: CHOL No results found for: HDL No results found for: LDLCALC No results found for: TRIG No results found for: CHOLHDL No results found for: HGBA1C    Assessment & Plan:  1. Acute pain of right shoulder Minimal evaluation in ED. Discussed exercises. Get shoulder xray if no improvement prior to return appointment. - DG Shoulder Right; Future  2. Closed fracture of body of sternum, initial encounter Still very painful.  Percocet refilled.   3. Contusion of right thigh, initial encounter Improving.   4. Closed fracture of right orbit, initial encounter and frontal bone of cranium ([place]) Refer to ENT. Improved when compared with ED report.   5. Acute fracture of frontal bone. Improving.  6. SDH - TINY AREA ON RIGHT FRONTAL LOBE.  Meds ordered this encounter  Medications  . oxyCODONE-acetaminophen (PERCOCET/ROXICET) 5-325 MG tablet    Sig: Take 1 tablet  by mouth every 4 (four) hours as needed for severe pain.    Dispense:  50 tablet    Refill:  0    Follow-up: Return in about 4 weeks (around 12/08/2019).    Rochel Brome, MD

## 2019-11-27 ENCOUNTER — Other Ambulatory Visit: Payer: Self-pay | Admitting: Family Medicine

## 2019-11-27 NOTE — Progress Notes (Signed)
Established Patient Office Visit  Subjective:  Patient ID: Christian Lowe, male    DOB: August 31, 1940  Age: 79 y.o. MRN: [id number]  CC:  Chief Complaint  Patient presents with  . Shoulder Pain    3 week f/u    HPI Christian Lowe presents for follow up after fall.  Patient was standing on a 6 foot ladder to put a roof on a gazebo and fell hitting the right side of his body. He did have LOC. He is unsure how long. He did call EMS who took him to ED. HE had a ct scan of his brain, cspine, face, chest, and abdomen. He was found to have a fracture of his frontal bone and right upper orbit, as well as his sternum. He did not go to ENT surgeon. The only issue on his face is some numbness over 2 upper teeth on the right side. He had a subdural hematoma. Continued right shoulder pain and rt flank pain. Improved. Has a rash under right axilla which is Improving.it does not itch and is not painful. .  Past Medical History:  Diagnosis Date  . BPPV (benign paroxysmal positional vertigo)   . Calculus of kidney   . Calculus of kidney   . Cancer ([place]) 2015   skin cancer removed off left forearm   . GERD (gastroesophageal reflux disease)   . History of basal cell carcinoma (BCC)   . Mixed hyperlipidemia   . Osteoarthritis   . Prediabetes   . Vitamin D deficiency     Past Surgical History:  Procedure Laterality Date  . EYE SURGERY Bilateral 2012   cataracts w lens implants  . PROSTATECTOMY  2008   BPH  . 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 TEMPORAL ARTERY BIOPSY / LIGATION Bilateral 1997   negative  . TRANSURETHRAL RESECTION OF PROSTATE  2010    No family history on file.  Social History   Socioeconomic History  . Marital status: Widowed    Spouse name: Not on file  . Number of children: Not on file  . Years of education: Not on file  . Highest education level: Not on file  Occupational History  . Not on file  Tobacco Use  . Smoking status: Former Smoker     Packs/day: 1.00    Years: 20.00    Pack years: 20.00    Types: Cigarettes    Quit date: 01/06/1967    Years since quitting: 52.9  . Smokeless tobacco: Never Used  Substance and Sexual Activity  . Alcohol use: Yes    Comment: occassionally  . Drug use: No  . Sexual activity: Not on file  Other Topics Concern  . Not on file  Social History Narrative  . Not on file   Social Determinants of Health   Financial Resource Strain:   . Difficulty of Paying Living Expenses:   Food Insecurity:   . Worried About Charity fundraiser in the Last Year:   . Arboriculturist in the Last Year:   Transportation Needs:   . Film/video editor (Medical):   Marland Kitchen Lack of Transportation (Non-Medical):   Physical Activity:   . Days of Exercise per Week:   . Minutes of Exercise per Session:   Stress:   . Feeling of Stress :   Social Connections:   . Frequency of Communication with Friends and Family:   . Frequency of Social Gatherings with Friends and Family:   . Attends  Religious Services:   . Active Member of Clubs or Organizations:   . Attends Archivist Meetings:   Marland Kitchen Marital Status:   Intimate Partner Violence:   . Fear of Current or Ex-Partner:   . Emotionally Abused:   Marland Kitchen Physically Abused:   . Sexually Abused:     Outpatient Medications Prior to Visit  Medication Sig Dispense Refill  . aspirin 325 MG EC tablet Take 325 mg by mouth as needed for pain (or headaches).     . Cholecalciferol (VITAMIN D3) 25 MCG (1000 UT) CAPS Take by mouth.    . Fluticasone-Umeclidin-Vilant (TRELEGY ELLIPTA) 100-62.5-25 MCG/INH AEPB Inhale into the lungs.    . Lactobacillus (ACIDOPHILUS PO) Take 1 capsule by mouth 2 (two) times daily.     . naproxen (NAPROSYN) 500 MG tablet Take 1 tablet (500 mg total) by mouth 2 (two) times daily with a meal. As needed for pain. 60 tablet 0  . Omega-3 Fatty Acids (FISH OIL) 1200 MG CAPS Take by mouth.    Marland Kitchen omeprazole (PRILOSEC) 20 MG capsule Take 20 mg by mouth  daily before breakfast.     . oxyCODONE-acetaminophen (PERCOCET/ROXICET) 5-325 MG tablet Take 1 tablet by mouth every 4 (four) hours as needed for severe pain. 50 tablet 0  . pregabalin (LYRICA) 150 MG capsule Take 150 mg by mouth 2 (two) times daily.    . sildenafil (REVATIO) 20 MG tablet Take 20 mg by mouth 3 (three) times daily.    . Testosterone Cypionate 200 MG/ML KIT Inject into the muscle. 1 ml every 2 weeks     No facility-administered medications prior to visit.    Allergies  Allergen Reactions  . Statins     myalgias    ROS Review of Systems  Constitutional: Positive for fatigue. Negative for chills, diaphoresis and fever.  HENT: Positive for congestion. Negative for ear pain and sore throat.   Respiratory: Negative for cough and shortness of breath.   Cardiovascular: Negative for chest pain and leg swelling.  Gastrointestinal: Negative for abdominal pain, constipation, diarrhea, nausea and vomiting.  Musculoskeletal: Positive for arthralgias (RT SHOULDER) and back pain (RT LOWER THORACIC POSTERIOR BACK PAIN.). Negative for myalgias.  Psychiatric/Behavioral: Negative for sleep disturbance (DUE TO PAIN.).      Objective:    Physical Exam  Constitutional: He is oriented to person, place, and time. He appears well-developed and well-nourished.  HENT:  No further bruising on his face..  Cardiovascular: Normal rate, regular rhythm and normal heart sounds.  Pulmonary/Chest: Effort normal and breath sounds normal.  Abdominal: Bowel sounds are normal.  Musculoskeletal:        General: Tenderness (anterior right shoulder. Tendre over right posterior lower rib  cage.) present.     Cervical back: Normal range of motion.     Comments: Right shoulder poor abduction, internal and external rotation.  Positive empty can on the right.  Neurological: He is alert and oriented to person, place, and time.  Skin: Skin is warm. Rash noted.  Psychiatric: He has a normal mood and affect.  His behavior is normal.    BP 140/64   Pulse 80   Temp 97.6 F (36.4 C)   Resp 18   Ht 5' 11"  (1.803 m)   Wt 224 lb 3.2 oz (101.7 kg)   BMI 31.27 kg/m  Wt Readings from Last 3 Encounters:  12/01/19 224 lb 3.2 oz (101.7 kg)  11/10/19 229 lb (103.9 kg)  11/07/19 225 lb (102.1 kg)  Health Maintenance Due  Topic Date Due  . COVID-19 Vaccine (1) Never done    There are no preventive care reminders to display for this patient.  No results found for: TSH Lab Results  Component Value Date   WBC 17.8 (H) 11/07/2019   HGB 17.0 11/07/2019   HCT 50.0 11/07/2019   MCV 86.6 11/07/2019   PLT 273 11/07/2019   Lab Results  Component Value Date   NA 138 11/07/2019   K 4.0 11/07/2019   CO2 24 11/07/2019   GLUCOSE 157 (H) 11/07/2019   BUN 19 11/07/2019   CREATININE 0.90 11/07/2019   BILITOT 0.8 11/07/2019   ALKPHOS 41 11/07/2019   AST 29 11/07/2019   ALT 18 11/07/2019   PROT 6.8 11/07/2019   ALBUMIN 3.9 11/07/2019   CALCIUM 9.3 11/07/2019   ANIONGAP 12 11/07/2019     Assessment & Plan:  1. Closed fracture of frontal bone with routine healing, subsequent encounter Healing well. 2. Closed fracture of right orbit with routine healing, subsequent encounter Healing well.  3. Closed fracture of body of sternum, initial encounter Pain has improved. Breathing has improved.   4. Right shoulder impingement.  Risks were discussed including bleeding, infection, increase in sugars if diabetic, atrophy at site of injection, and increased pain.  After consent was obtained, using sterile technique the posterior right shoulder was prepped with betadine and alcohol.  Kenalog 40 mg and 5 ml plain Lidocaine was then injected and the needle withdrawn.  The procedure was well tolerated.   The patient is asked to continue to rest the joint for a few more days before resuming regular activities.  It may be more painful for the first 1-2 days.  Watch for fever, or increased swelling or  persistent pain in the joint. Call or return to clinic prn if such symptoms occur or there is failure to improve as anticipated.  5. Dermatitis - improving.  Monitor.     F-up: No follow-ups on file.    Rochel Brome, MD

## 2019-12-01 ENCOUNTER — Other Ambulatory Visit: Payer: Self-pay

## 2019-12-01 ENCOUNTER — Ambulatory Visit (INDEPENDENT_AMBULATORY_CARE_PROVIDER_SITE_OTHER): Payer: Medicare HMO | Admitting: Family Medicine

## 2019-12-01 VITALS — BP 140/64 | HR 80 | Temp 97.6°F | Resp 18 | Ht 71.0 in | Wt 224.2 lb

## 2019-12-01 DIAGNOSIS — S020XXD Fracture of vault of skull, subsequent encounter for fracture with routine healing: Secondary | ICD-10-CM | POA: Diagnosis not present

## 2019-12-01 DIAGNOSIS — S2222XA Fracture of body of sternum, initial encounter for closed fracture: Secondary | ICD-10-CM

## 2019-12-01 DIAGNOSIS — S0285XD Fracture of orbit, unspecified, subsequent encounter for fracture with routine healing: Secondary | ICD-10-CM

## 2019-12-01 MED ORDER — TRIAMCINOLONE ACETONIDE 0.1 % EX CREA: 1.0000 "application " | TOPICAL_CREAM | Freq: Two times a day (BID) | CUTANEOUS | 2 refills | Status: DC

## 2019-12-01 NOTE — Patient Instructions (Signed)
Naproxen 500 mg one twice a day. Call if not improving in 2-3 weeks and we will get an MRI of right shoulder.   Shoulder Exercises Ask your health care provider which exercises are safe for you. Do exercises exactly as told by your health care provider and adjust them as directed. It is normal to feel mild stretching, pulling, tightness, or discomfort as you do these exercises. Stop right away if you feel sudden pain or your pain gets worse. Do not begin these exercises until told by your health care provider. Stretching exercises External rotation and abduction This exercise is sometimes called corner stretch. This exercise rotates your arm outward (external rotation) and moves your arm out from your body (abduction). 1. Stand in a doorway with one of your feet slightly in front of the other. This is called a staggered stance. If you cannot reach your forearms to the door frame, stand facing a corner of a room. 2. Choose one of the following positions as told by your health care provider: ? Place your hands and forearms on the door frame above your head. ? Place your hands and forearms on the door frame at the height of your head. ? Place your hands on the door frame at the height of your elbows. 3. Slowly move your weight onto your front foot until you feel a stretch across your chest and in the front of your shoulders. Keep your head and chest upright and keep your abdominal muscles tight. 4. Hold for __________ seconds. 5. To release the stretch, shift your weight to your back foot. Repeat __________ times. Complete this exercise __________ times a day. Extension, standing 1. Stand and hold a broomstick, a cane, or a similar object behind your back. ? Your hands should be a little wider than shoulder width apart. ? Your palms should face away from your back. 2. Keeping your elbows straight and your shoulder muscles relaxed, move the stick away from your body until you feel a stretch in your  shoulders (extension). ? Avoid shrugging your shoulders while you move the stick. Keep your shoulder blades tucked down toward the middle of your back. 3. Hold for __________ seconds. 4. Slowly return to the starting position. Repeat __________ times. Complete this exercise __________ times a day. Range-of-motion exercises Pendulum  1. Stand near a wall or a surface that you can hold onto for balance. 2. Bend at the waist and let your left / right arm hang straight down. Use your other arm to support you. Keep your back straight and do not lock your knees. 3. Relax your left / right arm and shoulder muscles, and move your hips and your trunk so your left / right arm swings freely. Your arm should swing because of the motion of your body, not because you are using your arm or shoulder muscles. 4. Keep moving your hips and trunk so your arm swings in the following directions, as told by your health care provider: ? Side to side. ? Forward and backward. ? In clockwise and counterclockwise circles. 5. Continue each motion for __________ seconds, or for as long as told by your health care provider. 6. Slowly return to the starting position. Repeat __________ times. Complete this exercise __________ times a day. Shoulder flexion, standing  1. Stand and hold a broomstick, a cane, or a similar object. Place your hands a little more than shoulder width apart on the object. Your left / right hand should be palm up, and your  other hand should be palm down. 2. Keep your elbow straight and your shoulder muscles relaxed. Push the stick up with your healthy arm to raise your left / right arm in front of your body, and then over your head until you feel a stretch in your shoulder (flexion). ? Avoid shrugging your shoulder while you raise your arm. Keep your shoulder blade tucked down toward the middle of your back. 3. Hold for __________ seconds. 4. Slowly return to the starting position. Repeat __________  times. Complete this exercise __________ times a day. Shoulder abduction, standing 1. Stand and hold a broomstick, a cane, or a similar object. Place your hands a little more than shoulder width apart on the object. Your left / right hand should be palm up, and your other hand should be palm down. 2. Keep your elbow straight and your shoulder muscles relaxed. Push the object across your body toward your left / right side. Raise your left / right arm to the side of your body (abduction) until you feel a stretch in your shoulder. ? Do not raise your arm above shoulder height unless your health care provider tells you to do that. ? If directed, raise your arm over your head. ? Avoid shrugging your shoulder while you raise your arm. Keep your shoulder blade tucked down toward the middle of your back. 3. Hold for __________ seconds. 4. Slowly return to the starting position. Repeat __________ times. Complete this exercise __________ times a day. Internal rotation  1. Place your left / right hand behind your back, palm up. 2. Use your other hand to dangle an exercise band, a towel, or a similar object over your shoulder. Grasp the band with your left / right hand so you are holding on to both ends. 3. Gently pull up on the band until you feel a stretch in the front of your left / right shoulder. The movement of your arm toward the center of your body is called internal rotation. ? Avoid shrugging your shoulder while you raise your arm. Keep your shoulder blade tucked down toward the middle of your back. 4. Hold for __________ seconds. 5. Release the stretch by letting go of the band and lowering your hands. Repeat __________ times. Complete this exercise __________ times a day. Strengthening exercises External rotation  1. Sit in a stable chair without armrests. 2. Secure an exercise band to a stable object at elbow height on your left / right side. 3. Place a soft object, such as a folded towel or  a small pillow, between your left / right upper arm and your body to move your elbow about 4 inches (10 cm) away from your side. 4. Hold the end of the exercise band so it is tight and there is no slack. 5. Keeping your elbow pressed against the soft object, slowly move your forearm out, away from your abdomen (external rotation). Keep your body steady so only your forearm moves. 6. Hold for __________ seconds. 7. Slowly return to the starting position. Repeat __________ times. Complete this exercise __________ times a day. Shoulder abduction  1. Sit in a stable chair without armrests, or stand up. 2. Hold a __________ weight in your left / right hand, or hold an exercise band with both hands. 3. Start with your arms straight down and your left / right palm facing in, toward your body. 4. Slowly lift your left / right hand out to your side (abduction). Do not lift your hand above  shoulder height unless your health care provider tells you that this is safe. ? Keep your arms straight. ? Avoid shrugging your shoulder while you do this movement. Keep your shoulder blade tucked down toward the middle of your back. 5. Hold for __________ seconds. 6. Slowly lower your arm, and return to the starting position. Repeat __________ times. Complete this exercise __________ times a day. Shoulder extension 1. Sit in a stable chair without armrests, or stand up. 2. Secure an exercise band to a stable object in front of you so it is at shoulder height. 3. Hold one end of the exercise band in each hand. Your palms should face each other. 4. Straighten your elbows and lift your hands up to shoulder height. 5. Step back, away from the secured end of the exercise band, until the band is tight and there is no slack. 6. Squeeze your shoulder blades together as you pull your hands down to the sides of your thighs (extension). Stop when your hands are straight down by your sides. Do not let your hands go behind your  body. 7. Hold for __________ seconds. 8. Slowly return to the starting position. Repeat __________ times. Complete this exercise __________ times a day. Shoulder row 1. Sit in a stable chair without armrests, or stand up. 2. Secure an exercise band to a stable object in front of you so it is at waist height. 3. Hold one end of the exercise band in each hand. Position your palms so that your thumbs are facing the ceiling (neutral position). 4. Bend each of your elbows to a 90-degree angle (right angle) and keep your upper arms at your sides. 5. Step back until the band is tight and there is no slack. 6. Slowly pull your elbows back behind you. 7. Hold for __________ seconds. 8. Slowly return to the starting position. Repeat __________ times. Complete this exercise __________ times a day. Shoulder press-ups  1. Sit in a stable chair that has armrests. Sit upright, with your feet flat on the floor. 2. Put your hands on the armrests so your elbows are bent and your fingers are pointing forward. Your hands should be about even with the sides of your body. 3. Push down on the armrests and use your arms to lift yourself off the chair. Straighten your elbows and lift yourself up as much as you comfortably can. ? Move your shoulder blades down, and avoid letting your shoulders move up toward your ears. ? Keep your feet on the ground. As you get stronger, your feet should support less of your body weight as you lift yourself up. 4. Hold for __________ seconds. 5. Slowly lower yourself back into the chair. Repeat __________ times. Complete this exercise __________ times a day. Wall push-ups  1. Stand so you are facing a stable wall. Your feet should be about one arm-length away from the wall. 2. Lean forward and place your palms on the wall at shoulder height. 3. Keep your feet flat on the floor as you bend your elbows and lean forward toward the wall. 4. Hold for __________ seconds. 5. Straighten  your elbows to push yourself back to the starting position. Repeat __________ times. Complete this exercise __________ times a day. This information is not intended to replace advice given to you by your health care provider. Make sure you discuss any questions you have with your health care provider. Document Revised: 11/15/2018 Document Reviewed: 08/23/2018 Elsevier Patient Education  [place].  Shoulder  Impingement Syndrome  Shoulder impingement syndrome is a condition that causes pain when connective tissues (tendons) surrounding the shoulder joint become pinched. These tendons are part of the group of muscles and tissues that help to stabilize the shoulder (rotator cuff). Beneath the rotator cuff is a fluid-filled sac (bursa) that allows the muscles and tendons to glide smoothly. The bursa may become swollen or irritated (bursitis). Bursitis, swelling in the rotator cuff tendons, or both conditions can decrease how much space is under a bone in the shoulder joint (acromion), resulting in impingement. What are the causes? Shoulder impingement syndrome may be caused by bursitis or swelling of the rotator cuff tendons, which may result from:  Repetitive overhead arm movements.  Falling onto the shoulder.  Weakness in the shoulder muscles. What increases the risk? You may be more likely to develop this condition if you:  Play sports that involve throwing, such as baseball.  Participate in sports such as tennis, volleyball, and swimming.  Work as a Curator, Games developer, or Architect. Some people are also more likely to develop impingement syndrome because of the shape of their acromion bone. What are the signs or symptoms? The main symptom of this condition is pain on the front or side of the shoulder. The pain may:  Get worse when lifting or raising the arm.  Get worse at night.  Wake you up from sleeping.  Feel sharp when the shoulder is moved and then fade to an  ache. Other symptoms may include:  Tenderness.  Stiffness.  Inability to raise the arm above shoulder level or behind the body.  Weakness. How is this diagnosed? This condition may be diagnosed based on:  Your symptoms and medical history.  A physical exam.  Imaging tests, such as: ? X-rays. ? MRI. ? Ultrasound. How is this treated? This condition may be treated by:  Resting your shoulder and avoiding all activities that cause pain or put stress on the shoulder.  Icing your shoulder.  NSAIDs to help reduce pain and swelling.  One or more injections of medicines to numb the area and reduce inflammation.  Physical therapy.  Surgery. This may be needed if nonsurgical treatments have not helped. Surgery may involve repairing the rotator cuff, reshaping the acromion, or removing the bursa. Follow these instructions at home: Managing pain, stiffness, and swelling   If directed, put ice on the injured area. ? Put ice in a plastic bag. ? Place a towel between your skin and the bag. ? Leave the ice on for 20 minutes, 2-3 times a day. Activity  Rest and return to your normal activities as told by your health care provider. Ask your health care provider what activities are safe for you.  Do exercises as told by your health care provider. General instructions  Do not use any products that contain nicotine or tobacco, such as cigarettes, e-cigarettes, and chewing tobacco. These can delay healing. If you need help quitting, ask your health care provider.  Ask your health care provider when it is safe for you to drive.  Take over-the-counter and prescription medicines only as told by your health care provider.  Keep all follow-up visits as told by your health care provider. This is important. How is this prevented?  Give your body time to rest between periods of activity.  Be safe and responsible while being active. This will help you avoid falls.  Maintain physical  fitness, including strength and flexibility. Contact a health care provider if:  Your  symptoms have not improved after 1-2 months of treatment and rest.  You cannot lift your arm away from your body. Summary  Shoulder impingement syndrome is a condition that causes pain when connective tissues (tendons) surrounding the shoulder joint become pinched.  The main symptom of this condition is pain on the front or side of the shoulder.  This condition is usually treated with rest, ice, and pain medicines as needed. This information is not intended to replace advice given to you by your health care provider. Make sure you discuss any questions you have with your health care provider. Document Revised: 11/15/2018 Document Reviewed: 01/16/2018 Elsevier Patient Education  2020 [employer].

## 2019-12-05 DIAGNOSIS — E291 Testicular hypofunction: Secondary | ICD-10-CM | POA: Diagnosis not present

## 2019-12-12 DIAGNOSIS — E291 Testicular hypofunction: Secondary | ICD-10-CM | POA: Diagnosis not present

## 2019-12-19 DIAGNOSIS — E291 Testicular hypofunction: Secondary | ICD-10-CM | POA: Diagnosis not present

## 2019-12-26 ENCOUNTER — Other Ambulatory Visit: Payer: Self-pay | Admitting: Family Medicine

## 2019-12-26 DIAGNOSIS — E291 Testicular hypofunction: Secondary | ICD-10-CM | POA: Diagnosis not present

## 2019-12-30 ENCOUNTER — Other Ambulatory Visit: Payer: Self-pay

## 2019-12-30 ENCOUNTER — Ambulatory Visit (INDEPENDENT_AMBULATORY_CARE_PROVIDER_SITE_OTHER): Payer: Medicare HMO

## 2019-12-30 VITALS — BP 144/72 | HR 89 | Temp 98.6°F | Resp 16 | Ht 71.0 in | Wt 229.6 lb

## 2019-12-30 DIAGNOSIS — Z0000 Encounter for general adult medical examination without abnormal findings: Secondary | ICD-10-CM | POA: Diagnosis not present

## 2019-12-30 NOTE — Progress Notes (Signed)
Subjective:   Christian Lowe is a 79 y.o. male who presents for Medicare Annual/Subsequent preventive examination.  This wellness visit is conducted by a nurse.  The patient's medications were reviewed and reconciled since the patient's last visit.  History details were provided by the patient.  The history appears to be reliable.    Patient's last AWV was < one year ago.   Medical History: Patient history and Family history was reviewed  Medications, Allergies, and preventative health maintenance was reviewed and updated.  Review of Systems:  Review of Systems  Constitutional: Negative.   HENT: Negative.   Eyes: Negative.        Recent eye exam three weeks ago  Respiratory: Negative.  Negative for cough and shortness of breath.   Cardiovascular: Negative.  Negative for chest pain and palpitations.  Genitourinary: Negative.   Musculoskeletal: Positive for arthralgias.       Bilateral knee arthritis Right side shoulder pain from fall improving   Neurological: Negative.  Negative for dizziness, numbness and headaches.  Psychiatric/Behavioral: Negative for behavioral problems, confusion and suicidal ideas. The patient is not nervous/anxious.    Cardiac Risk Factors include: advanced age (>71mn, >>35women);hypertension;male gender;obesity (BMI >30kg/m2)     Objective:    Vitals: BP (!) 144/72 (BP Location: Left Arm, Patient Position: Sitting, Cuff Size: Large)   Pulse 89   Temp 98.6 F (37 C) (Temporal)   Resp 16   Ht _0  (1.803 m)   Wt 229 lb 9.6 oz (104.1 kg)   SpO2 96%   BMI 32.02 kg/m   Body mass index is 32.02 kg/m.  Advanced Directives 12/30/2019 01/10/2016  Does Patient Have a Medical Advance Directive? Yes Yes  Type of [place] -  Does patient want to make changes to medical advance directive? Yes (MAU/Ambulatory/Procedural Areas - Information given) -  [place]? No - copy  requested No - copy requested    Tobacco Social History   Tobacco Use  Smoking Status Former Smoker  . Packs/day: 1.00  . Years: 20.00  . Pack years: 20.00  . Types: Cigarettes  . Quit date: 01/06/1967  . Years since quitting: 53.0  Smokeless Tobacco Never Used     Counseling given: Not Answered   Clinical Intake:  Pre-visit preparation completed: Yes  Pain : 0-10 Pain Score: 1  Pain Type: Acute pain Pain Location: Knee Pain Orientation: Right Pain Descriptors / Indicators: Aching Pain Onset: More than a month ago Pain Frequency: Intermittent     BMI - recorded: 32.02 Nutritional Status: BMI > 30  Obese Nutritional Risks: None Diabetes: No  How often do you need to have someone help you when you read instructions, pamphlets, or other written materials from your doctor or pharmacy?: 1 - Never  Interpreter Needed?: No     Past Medical History:  Diagnosis Date  . BPPV (benign paroxysmal positional vertigo)   . Calculus of kidney   . Calculus of kidney   . Cancer ([place] 2015   skin cancer removed off left forearm   . GERD (gastroesophageal reflux disease)   . History of basal cell carcinoma (BCC)   . Mixed hyperlipidemia   . Osteoarthritis   . Prediabetes   . Vitamin D deficiency    Past Surgical History:  Procedure Laterality Date  . EYE SURGERY Bilateral 2012   cataracts w lens implants  . PROSTATECTOMY  2008   BPH  .  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 TEMPORAL ARTERY BIOPSY / LIGATION Bilateral 1997   negative  . TRANSURETHRAL RESECTION OF PROSTATE  2010   History reviewed. No pertinent family history. Social History   Socioeconomic History  . Marital status: Widowed    Spouse name: Not on file  . Number of children: Not on file  . Years of education: Not on file  . Highest education level: Not on file  Occupational History  . Not on file  Tobacco Use  . Smoking status: Former Smoker    Packs/day: 1.00    Years: 20.00    Pack  years: 20.00    Types: Cigarettes    Quit date: 01/06/1967    Years since quitting: 53.0  . Smokeless tobacco: Never Used  Substance and Sexual Activity  . Alcohol use: Yes    Comment: occassionally  . Drug use: No  . Sexual activity: Not on file  Other Topics Concern  . Not on file  Social History Narrative  . Not on file   Social Determinants of Health   Financial Resource Strain:   . Difficulty of Paying Living Expenses:   Food Insecurity:   . Worried About Charity fundraiser in the Last Year:   . Arboriculturist in the Last Year:   Transportation Needs:   . Film/video editor (Medical):   Marland Kitchen Lack of Transportation (Non-Medical):   Physical Activity:   . Days of Exercise per Week:   . Minutes of Exercise per Session:   Stress:   . Feeling of Stress :   Social Connections:   . Frequency of Communication with Friends and Family:   . Frequency of Social Gatherings with Friends and Family:   . Attends Religious Services:   . Active Member of Clubs or Organizations:   . Attends Archivist Meetings:   Marland Kitchen Marital Status:     Outpatient Encounter Medications as of 12/30/2019  Medication Sig  . aspirin 325 MG EC tablet Take 325 mg by mouth as needed for pain (or headaches).   . Cholecalciferol (VITAMIN D3) 25 MCG (1000 UT) CAPS Take by mouth.  . Fluticasone-Umeclidin-Vilant (TRELEGY ELLIPTA) 100-62.5-25 MCG/INH AEPB Inhale into the lungs.  . Lactobacillus (ACIDOPHILUS PO) Take 1 capsule by mouth 2 (two) times daily.   . naproxen (NAPROSYN) 500 MG tablet TAKE 1 TABLET (500 MG TOTAL) BY MOUTH 2 (TWO) TIMES DAILY WITH A MEAL. AS NEEDED FOR PAIN.  Marland Kitchen Omega-3 Fatty Acids (FISH OIL) 1200 MG CAPS Take by mouth.  Marland Kitchen omeprazole (PRILOSEC) 20 MG capsule Take 20 mg by mouth daily before breakfast.   . oxyCODONE-acetaminophen (PERCOCET/ROXICET) 5-325 MG tablet Take 1 tablet by mouth every 4 (four) hours as needed for severe pain.  . pregabalin (LYRICA) 150 MG capsule Take 150  mg by mouth 2 (two) times daily.  . sildenafil (REVATIO) 20 MG tablet Take 20 mg by mouth 3 (three) times daily.  . Testosterone Cypionate 200 MG/ML KIT Inject into the muscle. 1 ml every 2 weeks  . triamcinolone cream (KENALOG) 0.1 % Apply 1 application topically 2 (two) times daily.   No facility-administered encounter medications on file as of 12/30/2019.    Activities of Daily Living In your present state of health, do you have any difficulty performing the following activities: 12/30/2019  Hearing? Y  Comment corrected with hearing aids  Vision? N  Difficulty concentrating or making decisions? N  Walking or climbing stairs? N  Dressing  or bathing? N  Doing errands, shopping? N  Preparing Food and eating ? N  Using the Toilet? N  In the past six months, have you accidently leaked urine? N  Do you have problems with loss of bowel control? N  Managing your Medications? N  Managing your Finances? N  Housekeeping or managing your Housekeeping? N  Some recent data might be hidden    Patient Care Team: Rochel Brome, MD as PCP - General (Family Medicine)   Assessment:   This is a routine wellness examination for [employer].  Exercise Activities and Dietary recommendations Current Exercise Habits: Home exercise routine, Type of exercise: walking, Time (Minutes): 15, Frequency (Times/Week): 3, Weekly Exercise (Minutes/Week): 45, Intensity: Mild  Goals    . Prevent falls       Fall Risk Fall Risk  12/30/2019  Falls in the past year? 1  Number falls in past yr: 1  Comment fall from ladder resulting in injury, fall while getting off lawn mower without injury  Injury with Fall? 1  Risk for fall due to : Other (Comment)  Risk for fall due to: Comment knee pain  Follow up Falls prevention discussed;Falls evaluation completed   Is the patient's home free of loose throw rugs in walkways, pet beds, electrical cords, etc?   yes      Grab bars in the bathroom? no      Handrails on the  stairs?   yes      Adequate lighting?   yes   Depression Screen PHQ 2/9 Scores 12/30/2019  PHQ - 2 Score 0    Cognitive Function     6CIT Screen 12/30/2019  What Year? 0 points  What month? 0 points  What time? 0 points  Count back from 20 0 points  Months in reverse 0 points  Repeat phrase 0 points  Total Score 0    Immunization History  Administered Date(s) Administered  . Pneumococcal Conjugate-13 06/25/2015  . Pneumococcal Polysaccharide-23 05/16/2017  . Td 04/07/2018     Screening Tests Health Maintenance  Topic Date Due  . COVID-19 Vaccine (1) Never done  . INFLUENZA VACCINE  03/07/2020  . TETANUS/TDAP  04/07/2028  . PNA vac Low Risk Adult  Completed   Cancer Screenings: Lung: Low Dose CT Chest recommended if Age 88-80 years, 30 pack-year currently smoking OR have quit w/in 15years. Patient does not qualify. Colorectal: Colonoscopy 7 years ago       Plan:    Counseling was provided today regarding the following topics: healthy eating habits, home safety, vitamin and mineral supplementation (multivitiman), regular exercise, tobacco avoidance, limitation of alcohol intake, use of seat belts, firearm safety, and fall prevention.  Annual recommendations include: influenza vaccine, dental cleanings, and eye exams.  Recommended COVID-19 Vaccines  I have personally reviewed and noted the following in the patient's chart:   . Medical and social history . Use of alcohol, tobacco or illicit drugs  . Current medications and supplements . Functional ability and status . Nutritional status . Physical activity . Advanced directives . List of other physicians . Hospitalizations, surgeries, and ER visits in previous 12 months . Vitals . Screenings to include cognitive, depression, and falls . Referrals and appointments  In addition, I have reviewed and discussed with patient certain preventive protocols, quality metrics, and best practice recommendations. A  written personalized care plan for preventive services as well as general preventive health recommendations were provided to patient.     Erie Noe, LPN  12/30/2019

## 2019-12-30 NOTE — Patient Instructions (Addendum)
 Fall Prevention in the Home, Adult Falls can cause injuries. They can happen to people of all ages. There are many things you can do to make your home safe and to help prevent falls. Ask for help when making these changes, if needed. What actions can I take to prevent falls? General Instructions  Use good lighting in all rooms. Replace any light bulbs that burn out.  Turn on the lights when you go into a dark area. Use night-lights.  Keep items that you use often in easy-to-reach places. Lower the shelves around your home if necessary.  Set up your furniture so you have a clear path. Avoid moving your furniture around.  Do not have throw rugs and other things on the floor that can make you trip.  Avoid walking on wet floors.  If any of your floors are uneven, fix them.  Add color or contrast paint or tape to clearly mark and help you see: ? Any grab bars or handrails. ? First and last steps of stairways. ? Where the edge of each step is.  If you use a stepladder: ? Make sure that it is fully opened. Do not climb a closed stepladder. ? Make sure that both sides of the stepladder are locked into place. ? Ask someone to hold the stepladder for you while you use it.  If there are any pets around you, be aware of where they are. What can I do in the bathroom?      Keep the floor dry. Clean up any water that spills onto the floor as soon as it happens.  Remove soap buildup in the tub or shower regularly.  Use non-skid mats or decals on the floor of the tub or shower.  Attach bath mats securely with double-sided, non-slip rug tape.  If you need to sit down in the shower, use a plastic, non-slip stool.  Install grab bars by the toilet and in the tub and shower. Do not use towel bars as grab bars. What can I do in the bedroom?  Make sure that you have a light by your bed that is easy to reach.  Do not use any sheets or blankets that are too big for your bed. They should  not hang down onto the floor.  Have a firm chair that has side arms. You can use this for support while you get dressed. What can I do in the kitchen?  Clean up any spills right away.  If you need to reach something above you, use a strong step stool that has a grab bar.  Keep electrical cords out of the way.  Do not use floor polish or wax that makes floors slippery. If you must use wax, use non-skid floor wax. What can I do with my stairs?  Do not leave any items on the stairs.  Make sure that you have a light switch at the top of the stairs and the bottom of the stairs. If you do not have them, ask someone to add them for you.  Make sure that there are handrails on both sides of the stairs, and use them. Fix handrails that are broken or loose. Make sure that handrails are as long as the stairways.  Install non-slip stair treads on all stairs in your home.  Avoid having throw rugs at the top or bottom of the stairs. If you do have throw rugs, attach them to the floor with carpet tape.  Choose a carpet that   does not hide the edge of the steps on the stairway.  Check any carpeting to make sure that it is firmly attached to the stairs. Fix any carpet that is loose or worn. What can I do on the outside of my home?  Use bright outdoor lighting.  Regularly fix the edges of walkways and driveways and fix any cracks.  Remove anything that might make you trip as you walk through a door, such as a raised step or threshold.  Trim any bushes or trees on the path to your home.  Regularly check to see if handrails are loose or broken. Make sure that both sides of any steps have handrails.  Install guardrails along the edges of any raised decks and porches.  Clear walking paths of anything that might make someone trip, such as tools or rocks.  Have any leaves, snow, or ice cleared regularly.  Use sand or salt on walking paths during winter.  Clean up any spills in your garage right  away. This includes grease or oil spills. What other actions can I take?  Wear shoes that: ? Have a low heel. Do not wear high heels. ? Have rubber bottoms. ? Are comfortable and fit you well. ? Are closed at the toe. Do not wear open-toe sandals.  Use tools that help you move around (mobility aids) if they are needed. These include: ? Canes. ? Walkers. ? Scooters. ? Crutches.  Review your medicines with your doctor. Some medicines can make you feel dizzy. This can increase your chance of falling. Ask your doctor what other things you can do to help prevent falls. Where to find more information  Centers for Disease Control and Prevention, STEADI: https://cdc.gov  National Institute on Aging: https://go4life.nia.nih.gov Contact a doctor if:  You are afraid of falling at home.  You feel weak, drowsy, or dizzy at home.  You fall at home. Summary  There are many simple things that you can do to make your home safe and to help prevent falls.  Ways to make your home safe include removing tripping hazards and installing grab bars in the bathroom.  Ask for help when making these changes in your home. This information is not intended to replace advice given to you by your health care provider. Make sure you discuss any questions you have with your health care provider. Document Revised: 11/14/2018 Document Reviewed: 03/08/2017 Elsevier Patient Education  2020 Elsevier Inc.   Health Maintenance, Male Adopting a healthy lifestyle and getting preventive care are important in promoting health and wellness. Ask your health care provider about:  The right schedule for you to have regular tests and exams.  Things you can do on your own to prevent diseases and keep yourself healthy. What should I know about diet, weight, and exercise? Eat a healthy diet   Eat a diet that includes plenty of vegetables, fruits, low-fat dairy products, and lean protein.  Do not eat a lot of foods  that are high in solid fats, added sugars, or sodium. Maintain a healthy weight Body mass index (BMI) is a measurement that can be used to identify possible weight problems. It estimates body fat based on height and weight. Your health care provider can help determine your BMI and help you achieve or maintain a healthy weight. Get regular exercise Get regular exercise. This is one of the most important things you can do for your health. Most adults should:  Exercise for at least 150 minutes each week. The   exercise should increase your heart rate and make you sweat (moderate-intensity exercise).  Do strengthening exercises at least twice a week. This is in addition to the moderate-intensity exercise.  Spend less time sitting. Even light physical activity can be beneficial. Watch cholesterol and blood lipids Have your blood tested for lipids and cholesterol at 80 years of age, then have this test every 5 years. You may need to have your cholesterol levels checked more often if:  Your lipid or cholesterol levels are high.  You are older than 80 years of age.  You are at high risk for heart disease. What should I know about cancer screening? Many types of cancers can be detected early and may often be prevented. Depending on your health history and family history, you may need to have cancer screening at various ages. This may include screening for:  Colorectal cancer.  Prostate cancer.  Skin cancer.  Lung cancer. What should I know about heart disease, diabetes, and high blood pressure? Blood pressure and heart disease  High blood pressure causes heart disease and increases the risk of stroke. This is more likely to develop in people who have high blood pressure readings, are of African descent, or are overweight.  Talk with your health care provider about your target blood pressure readings.  Have your blood pressure checked: ? Every 3-5 years if you are 18-39 years of  age. ? Every year if you are 40 years old or older.  If you are between the ages of 65 and 75 and are a current or former smoker, ask your health care provider if you should have a one-time screening for abdominal aortic aneurysm (AAA). Diabetes Have regular diabetes screenings. This checks your fasting blood sugar level. Have the screening done:  Once every three years after age 45 if you are at a normal weight and have a low risk for diabetes.  More often and at a younger age if you are overweight or have a high risk for diabetes. What should I know about preventing infection? Hepatitis B If you have a higher risk for hepatitis B, you should be screened for this virus. Talk with your health care provider to find out if you are at risk for hepatitis B infection. Hepatitis C Blood testing is recommended for:  Everyone born from 1945 through 1965.  Anyone with known risk factors for hepatitis C. Sexually transmitted infections (STIs)  You should be screened each year for STIs, including gonorrhea and chlamydia, if: ? You are sexually active and are younger than 80 years of age. ? You are older than 80 years of age and your health care provider tells you that you are at risk for this type of infection. ? Your sexual activity has changed since you were last screened, and you are at increased risk for chlamydia or gonorrhea. Ask your health care provider if you are at risk.  Ask your health care provider about whether you are at high risk for HIV. Your health care provider may recommend a prescription medicine to help prevent HIV infection. If you choose to take medicine to prevent HIV, you should first get tested for HIV. You should then be tested every 3 months for as long as you are taking the medicine. Follow these instructions at home: Lifestyle  Do not use any products that contain nicotine or tobacco, such as cigarettes, e-cigarettes, and chewing tobacco. If you need help quitting,  ask your health care provider.  Do not use street   drugs.  Do not share needles.  Ask your health care provider for help if you need support or information about quitting drugs. Alcohol use  Do not drink alcohol if your health care provider tells you not to drink.  If you drink alcohol: ? Limit how much you have to 0-2 drinks a day. ? Be aware of how much alcohol is in your drink. In the [place] one drink equals one 12 oz bottle of beer (355 mL), one 5 oz glass of wine (148 mL), or one 1 oz glass of hard liquor (44 mL). General instructions  Schedule regular health, dental, and eye exams.  Stay current with your vaccines.  Tell your health care provider if: ? You often feel depressed. ? You have ever been abused or do not feel safe at home. Summary  Adopting a healthy lifestyle and getting preventive care are important in promoting health and wellness.  Follow your health care provider's instructions about healthy diet, exercising, and getting tested or screened for diseases.  Follow your health care provider's instructions on monitoring your cholesterol and blood pressure. This information is not intended to replace advice given to you by your health care provider. Make sure you discuss any questions you have with your health care provider. Document Revised: 07/17/2018 Document Reviewed: 07/17/2018 Elsevier Patient Education  2020 [employer].    Christian Lowe , Thank you for taking time to come for your Medicare Wellness Visit. I appreciate your ongoing commitment to your health goals. Please review the following plan we discussed and let me know if I can assist you in the future.   These are the goals we discussed: Goals    . Prevent falls       This is a list of the screening recommended for you and due dates:  Health Maintenance  Topic Date Due  . COVID-19 Vaccine (1) Never done  . Flu Shot  03/07/2020  . Tetanus Vaccine  04/07/2028  . Pneumonia vaccines  Completed

## 2020-01-02 DIAGNOSIS — E291 Testicular hypofunction: Secondary | ICD-10-CM | POA: Diagnosis not present

## 2020-01-09 DIAGNOSIS — E291 Testicular hypofunction: Secondary | ICD-10-CM | POA: Diagnosis not present

## 2020-01-15 DIAGNOSIS — M9903 Segmental and somatic dysfunction of lumbar region: Secondary | ICD-10-CM | POA: Diagnosis not present

## 2020-01-15 DIAGNOSIS — M9902 Segmental and somatic dysfunction of thoracic region: Secondary | ICD-10-CM | POA: Diagnosis not present

## 2020-01-15 DIAGNOSIS — M4312 Spondylolisthesis, cervical region: Secondary | ICD-10-CM | POA: Diagnosis not present

## 2020-01-15 DIAGNOSIS — M5031 Other cervical disc degeneration,  high cervical region: Secondary | ICD-10-CM | POA: Diagnosis not present

## 2020-01-15 DIAGNOSIS — M531 Cervicobrachial syndrome: Secondary | ICD-10-CM | POA: Diagnosis not present

## 2020-01-15 DIAGNOSIS — M50323 Other cervical disc degeneration at C6-C7 level: Secondary | ICD-10-CM | POA: Diagnosis not present

## 2020-01-15 DIAGNOSIS — M50322 Other cervical disc degeneration at C5-C6 level: Secondary | ICD-10-CM | POA: Diagnosis not present

## 2020-01-15 DIAGNOSIS — M50321 Other cervical disc degeneration at C4-C5 level: Secondary | ICD-10-CM | POA: Diagnosis not present

## 2020-01-16 DIAGNOSIS — E291 Testicular hypofunction: Secondary | ICD-10-CM | POA: Diagnosis not present

## 2020-01-19 DIAGNOSIS — M9903 Segmental and somatic dysfunction of lumbar region: Secondary | ICD-10-CM | POA: Diagnosis not present

## 2020-01-19 DIAGNOSIS — M9902 Segmental and somatic dysfunction of thoracic region: Secondary | ICD-10-CM | POA: Diagnosis not present

## 2020-01-19 DIAGNOSIS — M50323 Other cervical disc degeneration at C6-C7 level: Secondary | ICD-10-CM | POA: Diagnosis not present

## 2020-01-19 DIAGNOSIS — M531 Cervicobrachial syndrome: Secondary | ICD-10-CM | POA: Diagnosis not present

## 2020-01-19 DIAGNOSIS — M50322 Other cervical disc degeneration at C5-C6 level: Secondary | ICD-10-CM | POA: Diagnosis not present

## 2020-01-19 DIAGNOSIS — M5031 Other cervical disc degeneration,  high cervical region: Secondary | ICD-10-CM | POA: Diagnosis not present

## 2020-01-19 DIAGNOSIS — M50321 Other cervical disc degeneration at C4-C5 level: Secondary | ICD-10-CM | POA: Diagnosis not present

## 2020-01-19 DIAGNOSIS — M4312 Spondylolisthesis, cervical region: Secondary | ICD-10-CM | POA: Diagnosis not present

## 2020-01-20 ENCOUNTER — Other Ambulatory Visit: Payer: Self-pay | Admitting: Physician Assistant

## 2020-01-22 DIAGNOSIS — M50322 Other cervical disc degeneration at C5-C6 level: Secondary | ICD-10-CM | POA: Diagnosis not present

## 2020-01-22 DIAGNOSIS — D485 Neoplasm of uncertain behavior of skin: Secondary | ICD-10-CM | POA: Diagnosis not present

## 2020-01-22 DIAGNOSIS — M5031 Other cervical disc degeneration,  high cervical region: Secondary | ICD-10-CM | POA: Diagnosis not present

## 2020-01-22 DIAGNOSIS — M50321 Other cervical disc degeneration at C4-C5 level: Secondary | ICD-10-CM | POA: Diagnosis not present

## 2020-01-22 DIAGNOSIS — C44329 Squamous cell carcinoma of skin of other parts of face: Secondary | ICD-10-CM | POA: Diagnosis not present

## 2020-01-22 DIAGNOSIS — D2239 Melanocytic nevi of other parts of face: Secondary | ICD-10-CM | POA: Diagnosis not present

## 2020-01-22 DIAGNOSIS — M531 Cervicobrachial syndrome: Secondary | ICD-10-CM | POA: Diagnosis not present

## 2020-01-22 DIAGNOSIS — M9902 Segmental and somatic dysfunction of thoracic region: Secondary | ICD-10-CM | POA: Diagnosis not present

## 2020-01-22 DIAGNOSIS — L578 Other skin changes due to chronic exposure to nonionizing radiation: Secondary | ICD-10-CM | POA: Diagnosis not present

## 2020-01-22 DIAGNOSIS — L570 Actinic keratosis: Secondary | ICD-10-CM | POA: Diagnosis not present

## 2020-01-22 DIAGNOSIS — M4312 Spondylolisthesis, cervical region: Secondary | ICD-10-CM | POA: Diagnosis not present

## 2020-01-22 DIAGNOSIS — M50323 Other cervical disc degeneration at C6-C7 level: Secondary | ICD-10-CM | POA: Diagnosis not present

## 2020-01-22 DIAGNOSIS — M9903 Segmental and somatic dysfunction of lumbar region: Secondary | ICD-10-CM | POA: Diagnosis not present

## 2020-01-26 DIAGNOSIS — M50321 Other cervical disc degeneration at C4-C5 level: Secondary | ICD-10-CM | POA: Diagnosis not present

## 2020-01-26 DIAGNOSIS — M9903 Segmental and somatic dysfunction of lumbar region: Secondary | ICD-10-CM | POA: Diagnosis not present

## 2020-01-26 DIAGNOSIS — M50322 Other cervical disc degeneration at C5-C6 level: Secondary | ICD-10-CM | POA: Diagnosis not present

## 2020-01-26 DIAGNOSIS — M50323 Other cervical disc degeneration at C6-C7 level: Secondary | ICD-10-CM | POA: Diagnosis not present

## 2020-01-26 DIAGNOSIS — M5031 Other cervical disc degeneration,  high cervical region: Secondary | ICD-10-CM | POA: Diagnosis not present

## 2020-01-26 DIAGNOSIS — M531 Cervicobrachial syndrome: Secondary | ICD-10-CM | POA: Diagnosis not present

## 2020-01-26 DIAGNOSIS — M9902 Segmental and somatic dysfunction of thoracic region: Secondary | ICD-10-CM | POA: Diagnosis not present

## 2020-01-26 DIAGNOSIS — M4312 Spondylolisthesis, cervical region: Secondary | ICD-10-CM | POA: Diagnosis not present

## 2020-01-29 DIAGNOSIS — M5031 Other cervical disc degeneration,  high cervical region: Secondary | ICD-10-CM | POA: Diagnosis not present

## 2020-01-29 DIAGNOSIS — M9902 Segmental and somatic dysfunction of thoracic region: Secondary | ICD-10-CM | POA: Diagnosis not present

## 2020-01-29 DIAGNOSIS — M4312 Spondylolisthesis, cervical region: Secondary | ICD-10-CM | POA: Diagnosis not present

## 2020-01-29 DIAGNOSIS — M9903 Segmental and somatic dysfunction of lumbar region: Secondary | ICD-10-CM | POA: Diagnosis not present

## 2020-01-29 DIAGNOSIS — M50322 Other cervical disc degeneration at C5-C6 level: Secondary | ICD-10-CM | POA: Diagnosis not present

## 2020-01-29 DIAGNOSIS — M50321 Other cervical disc degeneration at C4-C5 level: Secondary | ICD-10-CM | POA: Diagnosis not present

## 2020-01-29 DIAGNOSIS — M50323 Other cervical disc degeneration at C6-C7 level: Secondary | ICD-10-CM | POA: Diagnosis not present

## 2020-01-29 DIAGNOSIS — M531 Cervicobrachial syndrome: Secondary | ICD-10-CM | POA: Diagnosis not present

## 2020-01-30 DIAGNOSIS — E291 Testicular hypofunction: Secondary | ICD-10-CM | POA: Diagnosis not present

## 2020-02-02 DIAGNOSIS — M9902 Segmental and somatic dysfunction of thoracic region: Secondary | ICD-10-CM | POA: Diagnosis not present

## 2020-02-02 DIAGNOSIS — M5031 Other cervical disc degeneration,  high cervical region: Secondary | ICD-10-CM | POA: Diagnosis not present

## 2020-02-02 DIAGNOSIS — M50321 Other cervical disc degeneration at C4-C5 level: Secondary | ICD-10-CM | POA: Diagnosis not present

## 2020-02-02 DIAGNOSIS — M9903 Segmental and somatic dysfunction of lumbar region: Secondary | ICD-10-CM | POA: Diagnosis not present

## 2020-02-02 DIAGNOSIS — M50323 Other cervical disc degeneration at C6-C7 level: Secondary | ICD-10-CM | POA: Diagnosis not present

## 2020-02-02 DIAGNOSIS — M50322 Other cervical disc degeneration at C5-C6 level: Secondary | ICD-10-CM | POA: Diagnosis not present

## 2020-02-02 DIAGNOSIS — M531 Cervicobrachial syndrome: Secondary | ICD-10-CM | POA: Diagnosis not present

## 2020-02-02 DIAGNOSIS — M4312 Spondylolisthesis, cervical region: Secondary | ICD-10-CM | POA: Diagnosis not present

## 2020-02-05 DIAGNOSIS — M50322 Other cervical disc degeneration at C5-C6 level: Secondary | ICD-10-CM | POA: Diagnosis not present

## 2020-02-05 DIAGNOSIS — M9902 Segmental and somatic dysfunction of thoracic region: Secondary | ICD-10-CM | POA: Diagnosis not present

## 2020-02-05 DIAGNOSIS — M5031 Other cervical disc degeneration,  high cervical region: Secondary | ICD-10-CM | POA: Diagnosis not present

## 2020-02-05 DIAGNOSIS — M9903 Segmental and somatic dysfunction of lumbar region: Secondary | ICD-10-CM | POA: Diagnosis not present

## 2020-02-05 DIAGNOSIS — M4312 Spondylolisthesis, cervical region: Secondary | ICD-10-CM | POA: Diagnosis not present

## 2020-02-05 DIAGNOSIS — M50321 Other cervical disc degeneration at C4-C5 level: Secondary | ICD-10-CM | POA: Diagnosis not present

## 2020-02-05 DIAGNOSIS — M50323 Other cervical disc degeneration at C6-C7 level: Secondary | ICD-10-CM | POA: Diagnosis not present

## 2020-02-05 DIAGNOSIS — M531 Cervicobrachial syndrome: Secondary | ICD-10-CM | POA: Diagnosis not present

## 2020-02-06 DIAGNOSIS — E291 Testicular hypofunction: Secondary | ICD-10-CM | POA: Diagnosis not present

## 2020-02-10 ENCOUNTER — Emergency Department (HOSPITAL_COMMUNITY): Payer: Medicare HMO

## 2020-02-10 ENCOUNTER — Encounter (HOSPITAL_COMMUNITY): Payer: Self-pay | Admitting: Emergency Medicine

## 2020-02-10 ENCOUNTER — Encounter (HOSPITAL_COMMUNITY)
Admission: EM | Disposition: A | Discharge status: Home / Self Care | Payer: Self-pay | Source: Home / Self Care | Attending: Neurosurgery

## 2020-02-10 ENCOUNTER — Inpatient Hospital Stay (HOSPITAL_COMMUNITY): Payer: Medicare HMO | Admitting: Anesthesiology

## 2020-02-10 ENCOUNTER — Other Ambulatory Visit: Payer: Self-pay

## 2020-02-10 ENCOUNTER — Inpatient Hospital Stay (HOSPITAL_COMMUNITY)
Admission: EM | Admit: 2020-02-10 | Discharge: 2020-02-15 | DRG: 026 | Disposition: A | Discharge status: Home / Self Care | Payer: Medicare HMO | Attending: Neurosurgery | Admitting: Neurosurgery

## 2020-02-10 ENCOUNTER — Telehealth: Payer: Self-pay | Admitting: Family Medicine

## 2020-02-10 DIAGNOSIS — Z9841 Cataract extraction status, right eye: Secondary | ICD-10-CM | POA: Diagnosis not present

## 2020-02-10 DIAGNOSIS — Z87442 Personal history of urinary calculi: Secondary | ICD-10-CM | POA: Diagnosis not present

## 2020-02-10 DIAGNOSIS — M1990 Unspecified osteoarthritis, unspecified site: Secondary | ICD-10-CM | POA: Diagnosis not present

## 2020-02-10 DIAGNOSIS — Z20822 Contact with and (suspected) exposure to covid-19: Secondary | ICD-10-CM | POA: Diagnosis not present

## 2020-02-10 DIAGNOSIS — Z96651 Presence of right artificial knee joint: Secondary | ICD-10-CM | POA: Diagnosis present

## 2020-02-10 DIAGNOSIS — Z9842 Cataract extraction status, left eye: Secondary | ICD-10-CM

## 2020-02-10 DIAGNOSIS — K219 Gastro-esophageal reflux disease without esophagitis: Secondary | ICD-10-CM | POA: Diagnosis present

## 2020-02-10 DIAGNOSIS — G4489 Other headache syndrome: Secondary | ICD-10-CM | POA: Diagnosis not present

## 2020-02-10 DIAGNOSIS — W11XXXS Fall on and from ladder, sequela: Secondary | ICD-10-CM | POA: Diagnosis present

## 2020-02-10 DIAGNOSIS — T8189XA Other complications of procedures, not elsewhere classified, initial encounter: Secondary | ICD-10-CM | POA: Diagnosis not present

## 2020-02-10 DIAGNOSIS — S065X0S Traumatic subdural hemorrhage without loss of consciousness, sequela: Principal | ICD-10-CM

## 2020-02-10 DIAGNOSIS — I6200 Nontraumatic subdural hemorrhage, unspecified: Secondary | ICD-10-CM | POA: Diagnosis not present

## 2020-02-10 DIAGNOSIS — I6201 Nontraumatic acute subdural hemorrhage: Secondary | ICD-10-CM | POA: Diagnosis not present

## 2020-02-10 DIAGNOSIS — Z888 Allergy status to other drugs, medicaments and biological substances status: Secondary | ICD-10-CM | POA: Diagnosis not present

## 2020-02-10 DIAGNOSIS — G9389 Other specified disorders of brain: Secondary | ICD-10-CM | POA: Diagnosis not present

## 2020-02-10 DIAGNOSIS — I10 Essential (primary) hypertension: Secondary | ICD-10-CM | POA: Diagnosis not present

## 2020-02-10 DIAGNOSIS — G4089 Other seizures: Secondary | ICD-10-CM | POA: Diagnosis not present

## 2020-02-10 DIAGNOSIS — S065XAA Traumatic subdural hemorrhage with loss of consciousness status unknown, initial encounter: Secondary | ICD-10-CM | POA: Diagnosis present

## 2020-02-10 DIAGNOSIS — R531 Weakness: Secondary | ICD-10-CM

## 2020-02-10 DIAGNOSIS — Z9889 Other specified postprocedural states: Secondary | ICD-10-CM

## 2020-02-10 DIAGNOSIS — W19XXXA Unspecified fall, initial encounter: Secondary | ICD-10-CM | POA: Diagnosis not present

## 2020-02-10 DIAGNOSIS — Z87891 Personal history of nicotine dependence: Secondary | ICD-10-CM | POA: Diagnosis not present

## 2020-02-10 DIAGNOSIS — Z8616 Personal history of COVID-19: Secondary | ICD-10-CM

## 2020-02-10 DIAGNOSIS — Z961 Presence of intraocular lens: Secondary | ICD-10-CM | POA: Diagnosis not present

## 2020-02-10 DIAGNOSIS — E782 Mixed hyperlipidemia: Secondary | ICD-10-CM | POA: Diagnosis not present

## 2020-02-10 DIAGNOSIS — Z79899 Other long term (current) drug therapy: Secondary | ICD-10-CM

## 2020-02-10 DIAGNOSIS — R9431 Abnormal electrocardiogram [ECG] [EKG]: Secondary | ICD-10-CM | POA: Diagnosis not present

## 2020-02-10 DIAGNOSIS — E559 Vitamin D deficiency, unspecified: Secondary | ICD-10-CM | POA: Diagnosis present

## 2020-02-10 DIAGNOSIS — Z85828 Personal history of other malignant neoplasm of skin: Secondary | ICD-10-CM | POA: Diagnosis not present

## 2020-02-10 DIAGNOSIS — I6203 Nontraumatic chronic subdural hemorrhage: Secondary | ICD-10-CM | POA: Diagnosis not present

## 2020-02-10 DIAGNOSIS — S065X0A Traumatic subdural hemorrhage without loss of consciousness, initial encounter: Secondary | ICD-10-CM | POA: Diagnosis not present

## 2020-02-10 DIAGNOSIS — R7303 Prediabetes: Secondary | ICD-10-CM | POA: Diagnosis not present

## 2020-02-10 DIAGNOSIS — R519 Headache, unspecified: Secondary | ICD-10-CM | POA: Diagnosis not present

## 2020-02-10 DIAGNOSIS — S065X9A Traumatic subdural hemorrhage with loss of consciousness of unspecified duration, initial encounter: Secondary | ICD-10-CM | POA: Diagnosis present

## 2020-02-10 HISTORY — PX: CRANIOTOMY: SHX93

## 2020-02-10 HISTORY — DX: Other specified postprocedural states: Z98.890

## 2020-02-10 LAB — COMPREHENSIVE METABOLIC PANEL
ALT: 11 U/L (ref 0–44)
AST: 14 U/L — ABNORMAL LOW (ref 15–41)
Albumin: 3.6 g/dL (ref 3.5–5.0)
Alkaline Phosphatase: 46 U/L (ref 38–126)
Anion gap: 10 (ref 5–15)
BUN: 14 mg/dL (ref 8–23)
CO2: 22 mmol/L (ref 22–32)
Calcium: 8.8 mg/dL — ABNORMAL LOW (ref 8.9–10.3)
Chloride: 105 mmol/L (ref 98–111)
Creatinine, Ser: 0.91 mg/dL (ref 0.61–1.24)
GFR calc Af Amer: >60 mL/min (ref >60)
GFR calc non Af Amer: >60 mL/min (ref >60)
Glucose, Bld: 116 mg/dL — ABNORMAL HIGH (ref 70–99)
Potassium: 3.9 mmol/L (ref 3.5–5.1)
Sodium: 137 mmol/L (ref 135–145)
Total Bilirubin: 1.1 mg/dL (ref 0.3–1.2)
Total Protein: 6.4 g/dL — ABNORMAL LOW (ref 6.5–8.1)

## 2020-02-10 LAB — I-STAT CHEM 8, ED
BUN: 15 mg/dL (ref 8–23)
Calcium, Ion: 1.21 mmol/L (ref 1.15–1.40)
Chloride: 104 mmol/L (ref 98–111)
Creatinine, Ser: 0.9 mg/dL (ref 0.61–1.24)
Glucose, Bld: 116 mg/dL — ABNORMAL HIGH (ref 70–99)
HCT: 47 % (ref 39.0–52.0)
Hemoglobin: 16 g/dL (ref 13.0–17.0)
Potassium: 3.8 mmol/L (ref 3.5–5.1)
Sodium: 140 mmol/L (ref 135–145)
TCO2: 26 mmol/L (ref 22–32)

## 2020-02-10 LAB — CBC
HCT: 50.4 % (ref 39.0–52.0)
Hemoglobin: 16.1 g/dL (ref 13.0–17.0)
MCH: 27.8 pg (ref 26.0–34.0)
MCHC: 31.9 g/dL (ref 30.0–36.0)
MCV: 87 fL (ref 80.0–100.0)
Platelets: 236 10*3/uL (ref 150–400)
RBC: 5.79 MIL/uL (ref 4.22–5.81)
RDW: 14.4 % (ref 11.5–15.5)
WBC: 6.2 10*3/uL (ref 4.0–10.5)
nRBC: 0 % (ref 0.0–0.2)

## 2020-02-10 LAB — URINALYSIS, ROUTINE W REFLEX MICROSCOPIC
Bilirubin Urine: NEGATIVE
Glucose, UA: NEGATIVE mg/dL
Hgb urine dipstick: NEGATIVE
Ketones, ur: NEGATIVE mg/dL
Leukocytes,Ua: NEGATIVE
Nitrite: NEGATIVE
Protein, ur: NEGATIVE mg/dL
Specific Gravity, Urine: 1.021 (ref 1.005–1.030)
pH: 5 (ref 5.0–8.0)

## 2020-02-10 LAB — DIFFERENTIAL
Abs Immature Granulocytes: 0.02 10*3/uL (ref 0.00–0.07)
Basophils Absolute: 0.1 10*3/uL (ref 0.0–0.1)
Basophils Relative: 1 %
Eosinophils Absolute: 0.3 10*3/uL (ref 0.0–0.5)
Eosinophils Relative: 4 %
Immature Granulocytes: 0 %
Lymphocytes Relative: 17 %
Lymphs Abs: 1 10*3/uL (ref 0.7–4.0)
Monocytes Absolute: 0.5 10*3/uL (ref 0.1–1.0)
Monocytes Relative: 8 %
Neutro Abs: 4.4 10*3/uL (ref 1.7–7.7)
Neutrophils Relative %: 70 %

## 2020-02-10 LAB — SARS CORONAVIRUS 2 BY RT PCR (HOSPITAL ORDER, PERFORMED IN ~~LOC~~ HOSPITAL LAB): SARS Coronavirus 2: NEGATIVE

## 2020-02-10 LAB — PROTIME-INR
INR: 1.1 (ref 0.8–1.2)
Prothrombin Time: 13.3 seconds (ref 11.4–15.2)

## 2020-02-10 LAB — CBG MONITORING, ED: Glucose-Capillary: 89 mg/dL (ref 70–99)

## 2020-02-10 LAB — GLUCOSE, CAPILLARY: Glucose-Capillary: 181 mg/dL — ABNORMAL HIGH (ref 70–99)

## 2020-02-10 LAB — APTT: aPTT: 28 seconds (ref 24–36)

## 2020-02-10 SURGERY — CRANIOTOMY HEMATOMA EVACUATION SUBDURAL
Anesthesia: General | Site: Head | Laterality: Left

## 2020-02-10 SURGERY — CRANIOTOMY HEMATOMA EVACUATION SUBDURAL
Anesthesia: General | Laterality: Left

## 2020-02-10 MED ORDER — DEXAMETHASONE SODIUM PHOSPHATE 4 MG/ML IJ SOLN
4.0000 mg | Freq: Four times a day (QID) | INTRAMUSCULAR | Status: DC
  Administered 2020-02-12: 4 mg via INTRAVENOUS
  Filled 2020-02-10: qty 1

## 2020-02-10 MED ORDER — LABETALOL HCL 5 MG/ML IV SOLN
INTRAVENOUS | Status: AC
  Administered 2020-02-10: 20 mg via INTRAVENOUS
  Filled 2020-02-10: qty 8

## 2020-02-10 MED ORDER — ONDANSETRON HCL 4 MG/2ML IJ SOLN: 4.0000 mg | INTRAMUSCULAR | Status: DC | PRN

## 2020-02-10 MED ORDER — SODIUM CHLORIDE 0.9 % IV SOLN: INTRAVENOUS | Status: DC | PRN

## 2020-02-10 MED ORDER — FENTANYL CITRATE (PF) 250 MCG/5ML IJ SOLN
INTRAMUSCULAR | Status: AC
  Filled 2020-02-10: qty 5

## 2020-02-10 MED ORDER — ROCURONIUM BROMIDE 100 MG/10ML IV SOLN
INTRAVENOUS | Status: DC | PRN
Start: 2020-02-10 — End: 2020-02-10
  Administered 2020-02-10: 50 mg via INTRAVENOUS

## 2020-02-10 MED ORDER — PROMETHAZINE HCL 25 MG PO TABS: 12.5000 mg | ORAL_TABLET | ORAL | Status: DC | PRN

## 2020-02-10 MED ORDER — BACITRACIN ZINC 500 UNIT/GM EX OINT
TOPICAL_OINTMENT | CUTANEOUS | Status: DC | PRN
  Administered 2020-02-10: 1 via TOPICAL

## 2020-02-10 MED ORDER — LEVETIRACETAM IN NACL 500 MG/100ML IV SOLN
500.0000 mg | INTRAVENOUS | Status: AC
  Administered 2020-02-10: 500 mg via INTRAVENOUS
  Filled 2020-02-10: qty 100

## 2020-02-10 MED ORDER — PANTOPRAZOLE SODIUM 40 MG IV SOLR
40.0000 mg | Freq: Every day | INTRAVENOUS | Status: DC
  Administered 2020-02-11 – 2020-02-12 (×2): 40 mg via INTRAVENOUS
  Filled 2020-02-10 (×2): qty 40

## 2020-02-10 MED ORDER — LEVETIRACETAM IN NACL 500 MG/100ML IV SOLN
500.0000 mg | Freq: Two times a day (BID) | INTRAVENOUS | Status: DC
  Administered 2020-02-11 – 2020-02-13 (×5): 500 mg via INTRAVENOUS
  Filled 2020-02-10 (×5): qty 100

## 2020-02-10 MED ORDER — SODIUM CHLORIDE 0.9 % IV SOLN: INTRAVENOUS | Status: DC

## 2020-02-10 MED ORDER — LABETALOL HCL 5 MG/ML IV SOLN
10.0000 mg | INTRAVENOUS | Status: DC | PRN
  Administered 2020-02-14: 20 mg via INTRAVENOUS
  Administered 2020-02-14: 10 mg via INTRAVENOUS
  Filled 2020-02-10 (×2): qty 4

## 2020-02-10 MED ORDER — ACETAMINOPHEN 500 MG PO TABS: 1000.0000 mg | ORAL_TABLET | Freq: Once | ORAL | Status: DC | PRN

## 2020-02-10 MED ORDER — ACETAMINOPHEN 10 MG/ML IV SOLN: 1000.0000 mg | Freq: Once | INTRAVENOUS | Status: DC | PRN

## 2020-02-10 MED ORDER — ACETAMINOPHEN 325 MG PO TABS: 650.0000 mg | ORAL_TABLET | ORAL | Status: DC | PRN

## 2020-02-10 MED ORDER — SUGAMMADEX SODIUM 200 MG/2ML IV SOLN
INTRAVENOUS | Status: DC | PRN
Start: 2020-02-10 — End: 2020-02-10
  Administered 2020-02-10: 200 mg via INTRAVENOUS

## 2020-02-10 MED ORDER — SODIUM CHLORIDE 0.9 % IV SOLN
INTRAVENOUS | Status: DC | PRN
  Administered 2020-02-10: 500 mL

## 2020-02-10 MED ORDER — THROMBIN 5000 UNITS EX SOLR
OROMUCOSAL | Status: DC | PRN
  Administered 2020-02-10: 5 mL via TOPICAL

## 2020-02-10 MED ORDER — LIDOCAINE-EPINEPHRINE 1 %-1:100000 IJ SOLN
INTRAMUSCULAR | Status: AC
  Filled 2020-02-10: qty 1

## 2020-02-10 MED ORDER — PROPOFOL 10 MG/ML IV BOLUS
INTRAVENOUS | Status: DC | PRN
Start: 2020-02-10 — End: 2020-02-10
  Administered 2020-02-10: 140 mg via INTRAVENOUS

## 2020-02-10 MED ORDER — FENTANYL CITRATE (PF) 250 MCG/5ML IJ SOLN
INTRAMUSCULAR | Status: DC | PRN
  Administered 2020-02-10: 100 ug via INTRAVENOUS

## 2020-02-10 MED ORDER — LEVETIRACETAM IN NACL 1000 MG/100ML IV SOLN
1000.0000 mg | Freq: Once | INTRAVENOUS | Status: AC
  Administered 2020-02-10: 1000 mg via INTRAVENOUS
  Filled 2020-02-10: qty 100

## 2020-02-10 MED ORDER — ONDANSETRON HCL 4 MG PO TABS: 4.0000 mg | ORAL_TABLET | ORAL | Status: DC | PRN

## 2020-02-10 MED ORDER — LIDOCAINE-EPINEPHRINE 1 %-1:100000 IJ SOLN
INTRAMUSCULAR | Status: DC | PRN
  Administered 2020-02-10: 10 mL via INTRADERMAL

## 2020-02-10 MED ORDER — PHENYLEPHRINE HCL-NACL 10-0.9 MG/250ML-% IV SOLN
INTRAVENOUS | Status: DC | PRN
Start: 2020-02-10 — End: 2020-02-10
  Administered 2020-02-10: 20 ug/min via INTRAVENOUS

## 2020-02-10 MED ORDER — OXYCODONE HCL 5 MG/5ML PO SOLN: 5.0000 mg | Freq: Once | ORAL | Status: DC | PRN

## 2020-02-10 MED ORDER — LIDOCAINE HCL (CARDIAC) PF 100 MG/5ML IV SOSY
PREFILLED_SYRINGE | INTRAVENOUS | Status: DC | PRN
Start: 2020-02-10 — End: 2020-02-10
  Administered 2020-02-10: 60 mg via INTRATRACHEAL

## 2020-02-10 MED ORDER — MIDAZOLAM HCL 2 MG/2ML IJ SOLN
INTRAMUSCULAR | Status: AC
  Filled 2020-02-10: qty 2

## 2020-02-10 MED ORDER — LABETALOL HCL 5 MG/ML IV SOLN: 5.0000 mg | INTRAVENOUS | Status: DC | PRN

## 2020-02-10 MED ORDER — CEFAZOLIN SODIUM-DEXTROSE 2-4 GM/100ML-% IV SOLN
2.0000 g | INTRAVENOUS | Status: AC
  Administered 2020-02-10: 2 g via INTRAVENOUS
  Filled 2020-02-10: qty 100

## 2020-02-10 MED ORDER — OXYCODONE HCL 5 MG PO TABS: 5.0000 mg | ORAL_TABLET | Freq: Once | ORAL | Status: DC | PRN

## 2020-02-10 MED ORDER — SODIUM CHLORIDE 0.9% FLUSH
3.0000 mL | Freq: Once | INTRAVENOUS | Status: DC
Start: 2020-02-10 — End: 2020-02-15

## 2020-02-10 MED ORDER — ALBUMIN HUMAN 5 % IV SOLN: INTRAVENOUS | Status: DC | PRN

## 2020-02-10 MED ORDER — SUCCINYLCHOLINE CHLORIDE 20 MG/ML IJ SOLN
INTRAMUSCULAR | Status: DC | PRN
  Administered 2020-02-10: 140 mg via INTRAVENOUS

## 2020-02-10 MED ORDER — BACITRACIN ZINC 500 UNIT/GM EX OINT
TOPICAL_OINTMENT | CUTANEOUS | Status: AC
  Filled 2020-02-10: qty 28.35

## 2020-02-10 MED ORDER — CEFAZOLIN SODIUM-DEXTROSE 2-4 GM/100ML-% IV SOLN
2.0000 g | Freq: Three times a day (TID) | INTRAVENOUS | Status: AC
  Administered 2020-02-11 (×2): 2 g via INTRAVENOUS
  Filled 2020-02-10 (×2): qty 100

## 2020-02-10 MED ORDER — CHLORHEXIDINE GLUCONATE CLOTH 2 % EX PADS
6.0000 | MEDICATED_PAD | Freq: Every day | CUTANEOUS | Status: DC
  Administered 2020-02-12 – 2020-02-14 (×3): 6 via TOPICAL

## 2020-02-10 MED ORDER — FENTANYL CITRATE (PF) 100 MCG/2ML IJ SOLN: 25.0000 ug | INTRAMUSCULAR | Status: DC | PRN

## 2020-02-10 MED ORDER — HYDROMORPHONE HCL 1 MG/ML IJ SOLN
0.5000 mg | INTRAMUSCULAR | Status: DC | PRN
  Administered 2020-02-11 (×4): 1 mg via INTRAVENOUS
  Filled 2020-02-10 (×4): qty 1

## 2020-02-10 MED ORDER — ACETAMINOPHEN 650 MG RE SUPP: 650.0000 mg | RECTAL | Status: DC | PRN

## 2020-02-10 MED ORDER — DEXAMETHASONE SODIUM PHOSPHATE 10 MG/ML IJ SOLN
6.0000 mg | Freq: Four times a day (QID) | INTRAMUSCULAR | Status: AC
  Administered 2020-02-11 (×4): 6 mg via INTRAVENOUS
  Filled 2020-02-10 (×4): qty 1

## 2020-02-10 MED ORDER — DEXAMETHASONE SODIUM PHOSPHATE 10 MG/ML IJ SOLN
INTRAMUSCULAR | Status: DC | PRN
Start: 2020-02-10 — End: 2020-02-10
  Administered 2020-02-10: 5 mg via INTRAVENOUS

## 2020-02-10 MED ORDER — THROMBIN 20000 UNITS EX SOLR
CUTANEOUS | Status: AC
  Filled 2020-02-10: qty 20000

## 2020-02-10 MED ORDER — DEXAMETHASONE SODIUM PHOSPHATE 4 MG/ML IJ SOLN: 4.0000 mg | Freq: Three times a day (TID) | INTRAMUSCULAR | Status: DC

## 2020-02-10 MED ORDER — HYDROCODONE-ACETAMINOPHEN 5-325 MG PO TABS: 1.0000 | ORAL_TABLET | ORAL | Status: DC | PRN

## 2020-02-10 MED ORDER — 0.9 % SODIUM CHLORIDE (POUR BTL) OPTIME
TOPICAL | Status: DC | PRN
  Administered 2020-02-10 (×3): 1000 mL

## 2020-02-10 MED ORDER — THROMBIN 20000 UNITS EX SOLR
CUTANEOUS | Status: DC | PRN
  Administered 2020-02-10: 20 mL via TOPICAL

## 2020-02-10 MED ORDER — HEMOSTATIC AGENTS (NO CHARGE) OPTIME
TOPICAL | Status: DC | PRN
  Administered 2020-02-10: 1 via TOPICAL

## 2020-02-10 MED ORDER — LABETALOL HCL 5 MG/ML IV SOLN: 20.0000 mg | Freq: Once | INTRAVENOUS | Status: AC

## 2020-02-10 MED ORDER — ACETAMINOPHEN 160 MG/5ML PO SOLN: 1000.0000 mg | Freq: Once | ORAL | Status: DC | PRN

## 2020-02-10 MED ORDER — THROMBIN 5000 UNITS EX SOLR
CUTANEOUS | Status: AC
  Filled 2020-02-10: qty 5000

## 2020-02-10 MED ORDER — EPHEDRINE SULFATE 50 MG/ML IJ SOLN
INTRAMUSCULAR | Status: DC | PRN
  Administered 2020-02-10: 10 mg via INTRAVENOUS
  Administered 2020-02-10: 5 mg via INTRAVENOUS

## 2020-02-10 SURGICAL SUPPLY — 69 items
ADH SKN CLS APL DERMABOND .7 (GAUZE/BANDAGES/DRESSINGS)
BAG DECANTER FOR FLEXI CONT (MISCELLANEOUS) ×2 IMPLANT
BIT DRILL WIRE PASS 1.3MM (BIT) ×1 IMPLANT
BLADE SURG 11 STRL SS (BLADE) IMPLANT
BNDG CMPR 75X41 PLY HI ABS (GAUZE/BANDAGES/DRESSINGS)
BNDG COHESIVE 4X5 TAN STRL (GAUZE/BANDAGES/DRESSINGS) IMPLANT
BNDG STRETCH 4X75 STRL LF (GAUZE/BANDAGES/DRESSINGS) IMPLANT
BUR ACORN 6.0 PRECISION (BURR) ×2 IMPLANT
BUR SPIRAL ROUTER 2.3 (BUR) ×1 IMPLANT
CANISTER SUCT 3000ML PPV (MISCELLANEOUS) ×3 IMPLANT
CARTRIDGE OIL MAESTRO DRILL (MISCELLANEOUS) ×1 IMPLANT
CLIP VESOCCLUDE MED 6/CT (CLIP) IMPLANT
COVER BACK TABLE 60X90IN (DRAPES) ×4 IMPLANT
COVER WAND RF STERILE (DRAPES) ×2 IMPLANT
DERMABOND ADVANCED (GAUZE/BANDAGES/DRESSINGS)
DERMABOND ADVANCED .7 DNX12 (GAUZE/BANDAGES/DRESSINGS) IMPLANT
DIFFUSER DRILL AIR PNEUMATIC (MISCELLANEOUS) ×2 IMPLANT
DRAIN CHANNEL 10M FLAT 3/4 FLT (DRAIN) ×1 IMPLANT
DRAPE MICROSCOPE LEICA (MISCELLANEOUS) IMPLANT
DRAPE NEUROLOGICAL W/INCISE (DRAPES) ×2 IMPLANT
DRAPE SURG 17X23 STRL (DRAPES) IMPLANT
DRAPE WARM FLUID 44X44 (DRAPES) ×2 IMPLANT
DRILL WIRE PASS 1.3MM (BIT)
ELECT REM PT RETURN 9FT ADLT (ELECTROSURGICAL) ×2
ELECTRODE REM PT RTRN 9FT ADLT (ELECTROSURGICAL) ×1 IMPLANT
EVACUATOR SILICONE 100CC (DRAIN) ×1 IMPLANT
GAUZE 4X4 16PLY RFD (DISPOSABLE) IMPLANT
GAUZE SPONGE 4X4 12PLY STRL (GAUZE/BANDAGES/DRESSINGS) ×2 IMPLANT
GLOVE BIO SURGEON STRL SZ 6.5 (GLOVE) ×3 IMPLANT
GLOVE BIO SURGEON STRL SZ7 (GLOVE) ×1 IMPLANT
GLOVE BIOGEL PI IND STRL 6.5 (GLOVE) ×1 IMPLANT
GLOVE BIOGEL PI INDICATOR 6.5 (GLOVE) ×1
GLOVE ECLIPSE 7.0 STRL STRAW (GLOVE) ×2 IMPLANT
GLOVE ECLIPSE 9.0 STRL (GLOVE) ×2 IMPLANT
GLOVE EXAM NITRILE XL STR (GLOVE) IMPLANT
GOWN STRL REUS W/ TWL LRG LVL3 (GOWN DISPOSABLE) IMPLANT
GOWN STRL REUS W/ TWL XL LVL3 (GOWN DISPOSABLE) IMPLANT
GOWN STRL REUS W/TWL 2XL LVL3 (GOWN DISPOSABLE) IMPLANT
GOWN STRL REUS W/TWL LRG LVL3 (GOWN DISPOSABLE) ×6
GOWN STRL REUS W/TWL XL LVL3 (GOWN DISPOSABLE)
HEMOSTAT SURGICEL 2X14 (HEMOSTASIS) IMPLANT
KIT BASIN OR (CUSTOM PROCEDURE TRAY) ×2 IMPLANT
KIT TURNOVER KIT B (KITS) ×2 IMPLANT
NDL HYPO 25X1 1.5 SAFETY (NEEDLE) ×1 IMPLANT
NEEDLE HYPO 25X1 1.5 SAFETY (NEEDLE) ×2 IMPLANT
NS IRRIG 1000ML POUR BTL (IV SOLUTION) ×4 IMPLANT
OIL CARTRIDGE MAESTRO DRILL (MISCELLANEOUS) ×2
PACK CRANIOTOMY CUSTOM (CUSTOM PROCEDURE TRAY) ×2 IMPLANT
PAD ARMBOARD 7.5X6 YLW CONV (MISCELLANEOUS) ×2 IMPLANT
PATTIES SURGICAL .25X.25 (GAUZE/BANDAGES/DRESSINGS) IMPLANT
PATTIES SURGICAL .5 X.5 (GAUZE/BANDAGES/DRESSINGS) IMPLANT
PATTIES SURGICAL .5 X3 (DISPOSABLE) IMPLANT
PATTIES SURGICAL 1X1 (DISPOSABLE) IMPLANT
PIN MAYFIELD SKULL DISP (PIN) IMPLANT
PLATE BONE 12 2H TARGET XL (Plate) ×2 IMPLANT
PLATE CRANIAL SHUNT 14 (Plate) ×1 IMPLANT
SCREW UNIII AXS SD 1.5X4 (Screw) ×9 IMPLANT
SPONGE LAP 18X18 RF (DISPOSABLE) IMPLANT
SPONGE NEURO XRAY DETECT 1X3 (DISPOSABLE) IMPLANT
SPONGE SURGIFOAM ABS GEL 100 (HEMOSTASIS) ×2 IMPLANT
STAPLER VISISTAT 35W (STAPLE) ×2 IMPLANT
STOCKINETTE TUBULAR 6 INCH (GAUZE/BANDAGES/DRESSINGS) ×1 IMPLANT
SUT NURALON 4 0 TR CR/8 (SUTURE) ×4 IMPLANT
SUT VIC AB 2-0 CT2 18 VCP726D (SUTURE) ×4 IMPLANT
TOWEL GREEN STERILE (TOWEL DISPOSABLE) ×2 IMPLANT
TOWEL GREEN STERILE FF (TOWEL DISPOSABLE) ×2 IMPLANT
TRAY FOLEY MTR SLVR 16FR STAT (SET/KITS/TRAYS/PACK) ×1 IMPLANT
UNDERPAD 30X36 HEAVY ABSORB (UNDERPADS AND DIAPERS) IMPLANT
WATER STERILE IRR 1000ML POUR (IV SOLUTION) ×2 IMPLANT

## 2020-02-10 NOTE — Transfer of Care (Signed)
Immediate Anesthesia Transfer of Care Note  Patient: Christian Lowe  Procedure(s) Performed: CRANIOTOMY HEMATOMA EVACUATION SUBDURAL (Left Head)  Patient Location: PACU  Anesthesia Type:General  Level of Consciousness: drowsy and responds to stimulation  Airway & Oxygen Therapy: Patient Spontanous Breathing  Post-op Assessment: Report given to RN  Post vital signs: Reviewed and stable  Last Vitals:  Vitals Value Taken Time  BP 153/82   Temp    Pulse 99 02/10/20 2204  Resp 16 02/10/20 2204  SpO2 96 % 02/10/20 2204  Vitals shown include unvalidated device data.  Last Pain:  Vitals:   02/10/20 1706  TempSrc: Oral  PainSc:          Complications: No complications documented.

## 2020-02-10 NOTE — ED Triage Notes (Signed)
Pt BIB [facility] from home, pt reports a headache and right arm weakness that started x 3 days ago while he was mowing the yard. Grip strength weaker on the right side. EMS VS: BP160/78

## 2020-02-10 NOTE — Op Note (Signed)
Date of procedure: 02/10/2020  Date of dictation: Same  Service: Neurosurgery  Preoperative diagnosis: Posttraumatic subacute subdural hematoma.  Postoperative diagnosis: Same  Procedure Name: Left frontal parietal craniotomy and evacuation of subdural hematoma  Placement of subdural drain  Surgeon:Mykel Sponaugle A.Karie Skowron, M.D.  Asst. Surgeon: Reinaldo Meeker, NP  Anesthesia: General  Indication: 79 year old with large subacute subdural with marked mass-effect and increasing symptoms.  Operative note: After induction anesthesia, patient positioned supine with head turned toward the right.  Patient's left frontal parietal scalp prepped draped sterilely.  A linear skin incision is made down to level the pericranium.  Retractors placed.  A frontoparietal craniotomy was then performed using high-speed drill.  Bone flap was elevated.  Dura is then opened in a cruciate fashion.  Subdural blood which had partially liquefied was released under pressure.  Stringy and partially clotted blood was then evacuated with gentle suction.  There was no obvious active bleeding but a few areas of bridging veins were coagulated.  The subdural membrane was carefully stripped off the underlying cortex being careful not to disturb the cortex itself.  Hemostasis was excellent.  Wound was then irrigated returning clear fluid.  A medium 10 mm Blake drain was then placed in the subdural space this was then exited through a separate stab incision.  The dura was loosely reapproximated.  Gelfoam was placed over the dural repair.  Bone flap was replaced and secured with OsteoMed plates.  Scalp was reapproximated to Vicryl suture in the galea and staples of the surface.  No apparent complications.  Patient tolerated procedure well and he returned to the recovery room postop.

## 2020-02-10 NOTE — H&P (Signed)
Christian Lowe is an 79 y.o. male.   Chief Complaint: Weakness HPI: 79 year old male approximately 2 months status post fall from ladder with mild traumatic brain injury.  Patient reports that over the past few days he has had increasing difficulty with headache and not feeling well.  Patient also notes some difficulty with right upper and right lower extremity weakness.  Patient's wife also notices that he is beginning to have some speech and language difficulty.  Patient currently has mild headache.  No history of seizure.  Patient does not use any type of anticoagulants.  Past medical history is otherwise good.  Past Medical History:  Diagnosis Date  . BPPV (benign paroxysmal positional vertigo)   . Calculus of kidney   . Calculus of kidney   . Cancer ([place]) 2015   skin cancer removed off left forearm   . GERD (gastroesophageal reflux disease)   . History of basal cell carcinoma (BCC)   . Mixed hyperlipidemia   . Osteoarthritis   . Prediabetes   . Vitamin D deficiency     Past Surgical History:  Procedure Laterality Date  . EYE SURGERY Bilateral 2012   cataracts w lens implants  . PROSTATECTOMY  2008   BPH  . 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 TEMPORAL ARTERY BIOPSY / LIGATION Bilateral 1997   negative  . TRANSURETHRAL RESECTION OF PROSTATE  2010    No family history on file. Social History:  reports that he quit smoking about 53 years ago. His smoking use included cigarettes. He has a 20.00 pack-year smoking history. He has never used smokeless tobacco. He reports current alcohol use. He reports that he does not use drugs.  Allergies:  Allergies  Allergen Reactions  . Statins     myalgias    (Not in a hospital admission)   Results for orders placed or performed during the hospital encounter of 02/10/20 (from the past 48 hour(s))  Protime-INR     Status: None   Collection Time: 02/10/20  4:28 PM  Result Value Ref Range   Prothrombin Time 13.3 11.4 - 15.2  seconds   INR 1.1 0.8 - 1.2    Comment: (NOTE) INR goal varies based on device and disease states. Performed at [facility], [place] [address] [place], [place] [address]   APTT     Status: None   Collection Time: 02/10/20  4:28 PM  Result Value Ref Range   aPTT 28 24 - 36 seconds    Comment: Performed at [facility] [address], [place] [address]  CBC     Status: None   Collection Time: 02/10/20  4:28 PM  Result Value Ref Range   WBC 6.2 4.0 - 10.5 K/uL   RBC 5.79 4.22 - 5.81 MIL/uL   Hemoglobin 16.1 13.0 - 17.0 g/dL   HCT 50.4 39 - 52 %   MCV 87.0 80.0 - 100.0 fL   MCH 27.8 26.0 - 34.0 pg   MCHC 31.9 30.0 - 36.0 g/dL   RDW 14.4 11.5 - 15.5 %   Platelets 236 150 - 400 K/uL   nRBC 0.0 0.0 - 0.2 %    Comment: Performed at [facility], [place] [address] [place], [place] [address]  Differential     Status: None   Collection Time: 02/10/20  4:28 PM  Result Value Ref Range   Neutrophils Relative % 70 %   Neutro Abs 4.4 1.7 - 7.7 K/uL  Lymphocytes Relative 17 %   Lymphs Abs 1.0 0.7 - 4.0 K/uL   Monocytes Relative 8 %   Monocytes Absolute 0.5 0 - 1 K/uL   Eosinophils Relative 4 %   Eosinophils Absolute 0.3 0 - 0 K/uL   Basophils Relative 1 %   Basophils Absolute 0.1 0 - 0 K/uL   Immature Granulocytes 0 %   Abs Immature Granulocytes 0.02 0.00 - 0.07 K/uL    Comment: Performed at [facility] [address] [place], [place] [address]  Comprehensive metabolic panel     Status: Abnormal   Collection Time: 02/10/20  4:28 PM  Result Value Ref Range   Sodium 137 135 - 145 mmol/L   Potassium 3.9 3.5 - 5.1 mmol/L   Chloride 105 98 - 111 mmol/L   CO2 22 22 - 32 mmol/L   Glucose, Bld 116 (H) 70 - 99 mg/dL    Comment: Glucose reference range applies only to samples taken after fasting for at least 8 hours.   BUN 14 8 - 23 mg/dL   Creatinine, Ser 0.91 0.61 - 1.24 mg/dL   Calcium 8.8 (L) 8.9 - 10.3 mg/dL   Total Protein 6.4 (L) 6.5 - 8.1 g/dL    Albumin 3.6 3.5 - 5.0 g/dL   AST 14 (L) 15 - 41 U/L   ALT 11 0 - 44 U/L   Alkaline Phosphatase 46 38 - 126 U/L   Total Bilirubin 1.1 0.3 - 1.2 mg/dL   GFR calc non Af Amer >60 >60 mL/min   GFR calc Af Amer >60 >60 mL/min   Anion gap 10 5 - 15    Comment: Performed at [facility] [address] [place], [place] [address]  I-stat chem 8, ED     Status: Abnormal   Collection Time: 02/10/20  4:52 PM  Result Value Ref Range   Sodium 140 135 - 145 mmol/L   Potassium 3.8 3.5 - 5.1 mmol/L   Chloride 104 98 - 111 mmol/L   BUN 15 8 - 23 mg/dL   Creatinine, Ser 0.90 0.61 - 1.24 mg/dL   Glucose, Bld 116 (H) 70 - 99 mg/dL    Comment: Glucose reference range applies only to samples taken after fasting for at least 8 hours.   Calcium, Ion 1.21 1.15 - 1.40 mmol/L   TCO2 26 22 - 32 mmol/L   Hemoglobin 16.0 13.0 - 17.0 g/dL   HCT 47.0 39 - 52 %  CBG monitoring, ED     Status: None   Collection Time: 02/10/20  6:06 PM  Result Value Ref Range   Glucose-Capillary 89 70 - 99 mg/dL    Comment: Glucose reference range applies only to samples taken after fasting for at least 8 hours.  Urinalysis, Routine w reflex microscopic     Status: Abnormal   Collection Time: 02/10/20  6:21 PM  Result Value Ref Range   Color, Urine YELLOW YELLOW   APPearance HAZY (A) CLEAR   Specific Gravity, Urine 1.021 1.005 - 1.030   pH 5.0 5.0 - 8.0   Glucose, UA NEGATIVE NEGATIVE mg/dL   Hgb urine dipstick NEGATIVE NEGATIVE   Bilirubin Urine NEGATIVE NEGATIVE   Ketones, ur NEGATIVE NEGATIVE mg/dL   Protein, ur NEGATIVE NEGATIVE mg/dL   Nitrite NEGATIVE NEGATIVE   Leukocytes,Ua NEGATIVE NEGATIVE    Comment: Performed at [facility] [address] [place], [place] [address]   CT HEAD WO CONTRAST  Result Date: 02/10/2020  CLINICAL DATA:  79 year old male with headache. EXAM: CT HEAD WITHOUT CONTRAST TECHNIQUE: Contiguous axial images were obtained from the base of the skull through the vertex without  intravenous contrast. COMPARISON:  Head CT dated 11/07/2019. FINDINGS: Brain: There is a large left hemispheric acute subdural hemorrhage with mixed density attenuation. The subdural collection measures up to 2.2 cm in greatest thickness. There is associated mass effect and compression of the left cerebral hemisphere and left lateral ventricle. There is approximately 9 mm left right midline shift. No evidence of transtentorial herniation at this time. Vascular: No hyperdense vessel or unexpected calcification. Skull: Normal. Negative for fracture or focal lesion. Sinuses/Orbits: No acute finding. Other: None IMPRESSION: Large left hemispheric acute subdural hemorrhage with associated mass effect and 9 mm left-to-right midline shift. These results were called by telephone at the time of interpretation on 02/10/2020 at 5:31 pm to provider [facility] , who verbally acknowledged these results. Electronically Signed   By: Anner Crete M.D.   On: 02/10/2020 17:35    Pertinent items noted in HPI and remainder of comprehensive ROS otherwise negative.  Blood pressure (!) 154/78, pulse 72, temperature 98.5 F (36.9 C), temperature source Oral, resp. rate 15, SpO2 96 %.  Patient is awake and alert.  He is oriented and appropriate.  His speech is somewhat halting but his content is good.  Cranial nerve function normal bilateral.  Motor examination extremities reveal some mild right-sided weakness with a right-sided pronator drift.  Sensory examination nonfocal.  Examination head ears eyes nose and throat is unremarked.  Chest and abdomen are benign.  Extremities are free from injury or deformity. 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  I discussed situation with patient and his wife.  I have explained that this is a large subdural fluid collection with significant mass-effect.  If left untreated his symptoms are likely worsen as the hematoma continues to enlarge.   Recommended we move forward with a left-sided craniotomy and evacuation of subdural hematoma.  I discussed the risks involved with surgery including but not limited to the risk of anesthesia, bleeding, infection, stroke, coma, death, seizure, need for reoperation.  Patient has been given the option as questions.  Appears to understand.  He wishes to proceed with surgery.  Cooper Render Harini Dearmond 02/10/2020, 6:44 PM

## 2020-02-10 NOTE — ED Provider Notes (Signed)
[facility] EMERGENCY DEPARTMENT Provider Note   CSN: [id number] Arrival date & time: 02/10/20  1608     History Chief Complaint  Patient presents with   Weakness    Christian Lowe is a 79 y.o. male presenting for evaluation of right-sided weakness.  Patient states symptoms began 3 days ago.  He was mowing his lawn, when he had sudden onset right-sided weakness.  Is mostly his arm, but he also reports a mild weakness in the right leg.  He felt his symptoms are getting better, however today they worsened again.  He denies headache, vision changes, slurred speech, chest pain, shortness breath, nausea, vomiting abdominal pain, loss of bowel bladder control.  He denies numbness or tingling.  He is not on blood thinners.  He denies trauma to the head in the past several days to weeks.  He did have head trauma 3 months ago, had negative imaging at that time.  He states he is taking naproxen to see if this helps his symptoms.  HPI     Past Medical History:  Diagnosis Date   BPPV (benign paroxysmal positional vertigo)    Calculus of kidney    Calculus of kidney    Cancer ([place]) 2015   skin cancer removed off left forearm    GERD (gastroesophageal reflux disease)    History of basal cell carcinoma (BCC)    Mixed hyperlipidemia    Osteoarthritis    Prediabetes    Vitamin D deficiency     Patient Active Problem List   Diagnosis Date Noted   Status post craniotomy 02/10/2020   Acute pain of right shoulder 11/10/2019   Closed fracture of body of sternum 11/10/2019   Closed fracture of right orbit ([place]) 11/10/2019   Contusion of right thigh 11/10/2019   Closed fracture of frontal bone ([place]) 11/10/2019   Subdural hematoma ([place]) 11/10/2019    Past Surgical History:  Procedure Laterality Date   EYE SURGERY Bilateral 2012   cataracts w lens implants   PROSTATECTOMY  2008   BPH   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   TEMPORAL ARTERY  BIOPSY / LIGATION Bilateral 1997   negative   TRANSURETHRAL RESECTION OF PROSTATE  2010       History reviewed. No pertinent family history.  Social History   Tobacco Use   Smoking status: Former Smoker    Packs/day: 1.00    Years: 20.00    Pack years: 20.00    Types: Cigarettes    Quit date: 01/06/1967    Years since quitting: 53.1   Smokeless tobacco: Never Used  Substance Use Topics   Alcohol use: Yes    Comment: occassionally   Drug use: No    Home Medications Prior to Admission medications   Medication Sig Start Date End Date Taking? Authorizing Provider  naproxen (NAPROSYN) 500 MG tablet TAKE 1 TABLET (500 MG TOTAL) BY MOUTH 2 (TWO) TIMES DAILY WITH A MEAL. AS NEEDED FOR PAIN. Patient taking differently: Take 500 mg by mouth 2 (two) times daily as needed for mild pain.  01/20/20  Yes Marge Duncans, PA-C  omeprazole (PRILOSEC) 20 MG capsule Take 20 mg by mouth daily before breakfast.    Yes [provider]  Testosterone Cypionate 200 MG/ML KIT Inject 200 mg into the muscle once a week. 1 ml every 2 weeks    Yes [provider]  oxyCODONE-acetaminophen (PERCOCET/ROXICET) 5-325 MG tablet Take 1 tablet by mouth every 4 (four) hours as needed  for severe pain. Patient not taking: Reported on 02/10/2020 11/10/19   CoxElnita Maxwell, MD  triamcinolone cream (KENALOG) 0.1 % Apply 1 application topically 2 (two) times daily. Patient not taking: Reported on 02/10/2020 12/01/19   Rochel Brome, MD    Allergies    Statins  Review of Systems   Review of Systems  Neurological: Positive for weakness.  All other systems reviewed and are negative.   Physical Exam Updated Vital Signs BP (!) 155/76    Pulse 77    Temp 98.5 F (36.9 C) (Oral)    Resp 18    SpO2 94%   Physical Exam Vitals and nursing note reviewed.  Constitutional:      General: He is not in acute distress.    Appearance: He is well-developed.     Comments: Appears nontoxic  HENT:     Head:  Normocephalic and atraumatic.     Comments: No head trauma Eyes:     Extraocular Movements: Extraocular movements intact.     Conjunctiva/sclera: Conjunctivae normal.     Pupils: Pupils are equal, round, and reactive to light.  Cardiovascular:     Rate and Rhythm: Normal rate and regular rhythm.     Pulses: Normal pulses.  Pulmonary:     Effort: Pulmonary effort is normal. No respiratory distress.     Breath sounds: Normal breath sounds. No wheezing.  Abdominal:     General: There is no distension.     Palpations: Abdomen is soft. There is no mass.     Tenderness: There is no abdominal tenderness. There is no guarding or rebound.  Musculoskeletal:        General: Normal range of motion.     Cervical back: Normal range of motion and neck supple.  Skin:    General: Skin is warm and dry.     Capillary Refill: Capillary refill takes less than 2 seconds.  Neurological:     General: No focal deficit present.     Mental Status: He is alert and oriented to person, place, and time.     GCS: GCS eye subscore is 4. GCS verbal subscore is 5. GCS motor subscore is 6.     Cranial Nerves: Cranial nerves are intact.     Sensory: Sensation is intact.     Motor: Motor function is intact.     Coordination: Coordination is intact.     Comments: No obvious neurologic deficits on my and exam.  CN intact.  Nose to finger intact.  Strength equal bilaterally of upper and lower extremities, the patient reports he needs increased effort on the right side.  Sensation intact x4.  No gross visual field deficits.  Pt denies speech abnormality. But ?if slightly delayed on my exam     ED Results / Procedures / Treatments   Labs (all labs ordered are listed, but only abnormal results are displayed) Labs Reviewed  COMPREHENSIVE METABOLIC PANEL - Abnormal; Notable for the following components:      Result Value   Glucose, Bld 116 (*)    Calcium 8.8 (*)    Total Protein 6.4 (*)    AST 14 (*)    All other  components within normal limits  URINALYSIS, ROUTINE W REFLEX MICROSCOPIC - Abnormal; Notable for the following components:   APPearance HAZY (*)    All other components within normal limits  I-STAT CHEM 8, ED - Abnormal; Notable for the following components:   Glucose, Bld 116 (*)    All  other components within normal limits  SARS CORONAVIRUS 2 BY RT PCR (HOSPITAL ORDER, [facility])  URINE CULTURE  PROTIME-INR  APTT  CBC  DIFFERENTIAL  CBG MONITORING, ED    EKG None  Radiology CT HEAD WO CONTRAST  Result Date: 02/10/2020 CLINICAL DATA:  79 year old male with headache. EXAM: CT HEAD WITHOUT CONTRAST TECHNIQUE: Contiguous axial images were obtained from the base of the skull through the vertex without intravenous contrast. COMPARISON:  Head CT dated 11/07/2019. FINDINGS: Brain: There is a large left hemispheric acute subdural hemorrhage with mixed density attenuation. The subdural collection measures up to 2.2 cm in greatest thickness. There is associated mass effect and compression of the left cerebral hemisphere and left lateral ventricle. There is approximately 9 mm left right midline shift. No evidence of transtentorial herniation at this time. Vascular: No hyperdense vessel or unexpected calcification. Skull: Normal. Negative for fracture or focal lesion. Sinuses/Orbits: No acute finding. Other: None IMPRESSION: Large left hemispheric acute subdural hemorrhage with associated mass effect and 9 mm left-to-right midline shift. These results were called by telephone at the time of interpretation on 02/10/2020 at 5:31 pm to provider [facility] , who verbally acknowledged these results. Electronically Signed   By: Anner Crete M.D.   On: 02/10/2020 17:35    Procedures .Critical Care Performed by: Franchot Heidelberg, PA-C Authorized by: Franchot Heidelberg, PA-C   Critical care provider statement:    Critical care time (minutes):  45   Critical care time was  exclusive of:  Separately billable procedures and treating other patients and teaching time   Critical care was necessary to treat or prevent imminent or life-threatening deterioration of the following conditions:  CNS failure or compromise   Critical care was time spent personally by me on the following activities:  Blood draw for specimens, development of treatment plan with patient or surrogate, evaluation of patient's response to treatment, examination of patient, obtaining history from patient or surrogate, ordering and review of laboratory studies, ordering and performing treatments and interventions, ordering and review of radiographic studies, pulse oximetry, re-evaluation of patient's condition and review of old charts   I assumed direction of critical care for this patient from another provider in my specialty: no   Comments:     Pt with acute head bleed requiring neurosurgeon consult and hospital admission.    (including critical care time)  Medications Ordered in ED Medications  sodium chloride flush (NS) 0.9 % injection 3 mL ( Intravenous MAR Hold 02/10/20 2108)  lidocaine-EPINEPHrine (XYLOCAINE W/EPI) 1 %-1:100000 (with pres) injection (10 mLs Intradermal Given 02/10/20 2107)  0.9 % irrigation (POUR BTL) (1,000 mLs Irrigation Given 02/10/20 2117)  bacitracin 50,000 Units in sodium chloride 0.9 % 500 mL irrigation (500 mLs Irrigation Given 02/10/20 2115)  Surgifoam 100 with Thrombin 20,000 units (20 ml) topical solution (20 mLs Topical Given 02/10/20 2115)  Surgifoam 1 Gm with Thrombin 5,000 units (5 ml) topical solution (5 mLs Topical Given 02/10/20 2117)  hemostatic agents (1 application Topical Given 02/10/20 2135)  bacitracin ointment (1 application Topical Given 02/10/20 2147)  ceFAZolin (ANCEF) IVPB 2g/100 mL premix (2 g Intravenous Given 02/10/20 2103)  levETIRAcetam (KEPPRA) IVPB 1000 mg/100 mL premix (1,000 mg Intravenous Given 02/10/20 2144)    ED Course  I have reviewed the triage vital  signs and the nursing notes.  Pertinent labs & imaging results that were available during my care of the patient were reviewed by me and considered in my medical  decision making (see chart for details).    MDM Rules/Calculators/A&P                          Patient presenting for evaluation of acute onset right-sided weakness.  This initially began 3 days ago, and worsened again today.  On my exam, he has no neurologic deficits, but does state it takes more effort to move the right side.  He has slightly hesitant speech, however he reports no change in his speech.  There is no 1 present at bedside to confirm.  CT head obtained from triage shows large subdural with 9 mm shift.  This is concerning in the setting of acute neurologic findings.  No trauma or anticoagulation labs overall reassuring, hemoglobin stable.  Will call neurosurgery.  Discussed with Dr. Annette Stable from neurosurgery, who will evaluate and admit the patient. Case dicussed with attending, Dr. Laverta Baltimore evaluated the pt.   Final Clinical Impression(s) / ED Diagnoses Final diagnoses:  Subdural hemorrhage ([place])  Right sided weakness    Rx / DC Orders ED Discharge Orders    None       Franchot Heidelberg, PA-C 02/10/20 2200    Margette Fast, MD 02/13/20 1354

## 2020-02-10 NOTE — Brief Op Note (Signed)
02/10/2020  10:02 PM  PATIENT:  Edwin Dada  79 y.o. male  PRE-OPERATIVE DIAGNOSIS:  Left Subdural Hematoma  POST-OPERATIVE DIAGNOSIS:  Left Subdural Hematoma  PROCEDURE:  Procedure(s): CRANIOTOMY HEMATOMA EVACUATION SUBDURAL (Left)  SURGEON:  Surgeon(s) and Role:    Earnie Larsson, MD - Primary  PHYSICIAN ASSISTANT:   ASSISTANTSMearl Latin   ANESTHESIA:   general  EBL:  100cc   BLOOD ADMINISTERED:none  DRAINS: (10 mm) Blake drain(s) in the Subdural space   LOCAL MEDICATIONS USED:  LIDOCAINE   SPECIMEN:  No Specimen  DISPOSITION OF SPECIMEN:  N/A  COUNTS:  YES  TOURNIQUET:  * No tourniquets in log *  DICTATION: .Dragon Dictation  PLAN OF CARE: Admit to inpatient   PATIENT DISPOSITION:  PACU - hemodynamically stable.   Delay start of Pharmacological VTE agent (>24hrs) due to surgical blood loss or risk of bleeding: yes

## 2020-02-10 NOTE — Telephone Encounter (Signed)
Patient called the office at approximately 2:45 pm and spoke with Christian Lowe, with complaints of rt sided weakness and Christian noticed his speech was not quite right. He immediately got me and I spoke with Christian Lowe. He seemed to be having some aphasia. He did again say the rt side was weak. He guessed it had come on in the last 2-3 days. I informed him I was going to call 911 which I did and sent them to his home. In addition, I spoke with a family member who was able to reach his wife, Christian Lowe, at work and sent her to the house. I called back after a brief period as the 911 operator was going to call him first. I was able to reach I believe his son in law, Christian Lowe, who answered Christian Lowe's phone. Christian Lowe informed me EMS had arrived and I spoke briefly with paramedic. He was subsequently taken to [facility].  I spoke to his family later in the evening and he has an acute subdural hematoma on a chronic subdural hematoma, He is undergoing craniotomy with drainage of subdural hematoma. Dr. Tobie Poet

## 2020-02-10 NOTE — Anesthesia Procedure Notes (Signed)
Procedure Name: Intubation Date/Time: 02/10/2020 9:54 PM Performed by: Oletta Lamas, CRNA Pre-anesthesia Checklist: Patient identified, Emergency Drugs available, Suction available and Patient being monitored Patient Re-evaluated:Patient Re-evaluated prior to induction Oxygen Delivery Method: Circle System Utilized Preoxygenation: Pre-oxygenation with 100% oxygen Induction Type: IV induction Ventilation: Mask ventilation without difficulty Laryngoscope Size: Miller and 2 Grade View: Grade I Tube type: Oral Tube size: 7.5 mm Number of attempts: 1 Airway Equipment and Method: Stylet Placement Confirmation: ETT inserted through vocal cords under direct vision,  positive ETCO2 and breath sounds checked- equal and bilateral Secured at: 22 cm Tube secured with: Tape Dental Injury: Teeth and Oropharynx as per pre-operative assessment

## 2020-02-10 NOTE — Anesthesia Preprocedure Evaluation (Signed)
Anesthesia Evaluation  Patient identified by MRN, date of birth, ID band Patient awake    Reviewed: Allergy & Precautions, NPO status , Patient's Chart, lab work & pertinent test results  History of Anesthesia Complications Negative for: history of anesthetic complications  Airway Mallampati: II  TM Distance: >3 FB Neck ROM: Full    Dental  (+) Dental Advisory Given, Teeth Intact   Pulmonary neg recent URI, former smoker,    breath sounds clear to auscultation       Cardiovascular negative cardio ROS   Rhythm:Regular     Neuro/Psych Subdural hematoma negative psych ROS   GI/Hepatic GERD  ,  Endo/Other  negative endocrine ROS  Renal/GU Renal disease     Musculoskeletal  (+) Arthritis ,   Abdominal   Peds  Hematology negative hematology ROS (+)   Anesthesia Other Findings 79 year old male approximately 2 months status post fall from ladder with mild traumatic brain injury.  Patient reports that over the past few days he has had increasing difficulty with headache and not feeling well.  Patient also notes some difficulty with right upper and right lower extremity weakness.  Patient's wife also notices that he is beginning to have some speech and language difficulty.  Patient currently has mild headache.  No history of seizure.  Patient does not use any type of anticoagulants  Reproductive/Obstetrics                             Anesthesia Physical Anesthesia Plan  ASA: II and emergent  Anesthesia Plan: General   Post-op Pain Management:    Induction: Intravenous  PONV Risk Score and Plan: 2 and Ondansetron and Dexamethasone  Airway Management Planned: Oral ETT  Additional Equipment: None  Intra-op Plan:   Post-operative Plan: Extubation in OR  Informed Consent: I have reviewed the patients History and Physical, chart, labs and discussed the procedure including the risks, benefits  and alternatives for the proposed anesthesia with the patient or authorized representative who has indicated his/her understanding and acceptance.     Dental advisory given  Plan Discussed with: CRNA and Surgeon  Anesthesia Plan Comments:         Anesthesia Quick Evaluation

## 2020-02-11 ENCOUNTER — Encounter (HOSPITAL_COMMUNITY): Payer: Self-pay | Admitting: Neurosurgery

## 2020-02-11 ENCOUNTER — Inpatient Hospital Stay (HOSPITAL_COMMUNITY): Payer: Medicare HMO

## 2020-02-11 LAB — BASIC METABOLIC PANEL
Anion gap: 11 (ref 5–15)
BUN: 13 mg/dL (ref 8–23)
CO2: 20 mmol/L — ABNORMAL LOW (ref 22–32)
Calcium: 8.2 mg/dL — ABNORMAL LOW (ref 8.9–10.3)
Chloride: 106 mmol/L (ref 98–111)
Creatinine, Ser: 1.04 mg/dL (ref 0.61–1.24)
GFR calc Af Amer: >60 mL/min (ref >60)
GFR calc non Af Amer: >60 mL/min (ref >60)
Glucose, Bld: 215 mg/dL — ABNORMAL HIGH (ref 70–99)
Potassium: 4.1 mmol/L (ref 3.5–5.1)
Sodium: 137 mmol/L (ref 135–145)

## 2020-02-11 LAB — CBC
HCT: 45.7 % (ref 39.0–52.0)
Hemoglobin: 14.6 g/dL (ref 13.0–17.0)
MCH: 27.7 pg (ref 26.0–34.0)
MCHC: 31.9 g/dL (ref 30.0–36.0)
MCV: 86.6 fL (ref 80.0–100.0)
Platelets: 242 10*3/uL (ref 150–400)
RBC: 5.28 MIL/uL (ref 4.22–5.81)
RDW: 14.3 % (ref 11.5–15.5)
WBC: 10.8 10*3/uL — ABNORMAL HIGH (ref 4.0–10.5)
nRBC: 0 % (ref 0.0–0.2)

## 2020-02-11 LAB — MRSA PCR SCREENING: MRSA by PCR: NEGATIVE

## 2020-02-11 MED ORDER — MIDAZOLAM HCL 2 MG/2ML IJ SOLN: 2.0000 mg | Freq: Once | INTRAMUSCULAR | Status: DC

## 2020-02-11 MED ORDER — HALOPERIDOL LACTATE 5 MG/ML IJ SOLN
INTRAMUSCULAR | Status: AC
  Filled 2020-02-11: qty 1

## 2020-02-11 MED ORDER — MIDAZOLAM HCL 2 MG/2ML IJ SOLN
INTRAMUSCULAR | Status: AC
  Filled 2020-02-11: qty 2

## 2020-02-11 MED ORDER — SODIUM CHLORIDE 0.9 % IV SOLN
1000.0000 mg | Freq: Once | INTRAVENOUS | Status: AC
  Administered 2020-02-11: 1000 mg via INTRAVENOUS
  Filled 2020-02-11: qty 20

## 2020-02-11 MED ORDER — HALOPERIDOL LACTATE 5 MG/ML IJ SOLN
5.0000 mg | Freq: Once | INTRAMUSCULAR | Status: AC
  Administered 2020-02-11: 5 mg via INTRAVENOUS

## 2020-02-11 MED ORDER — HALOPERIDOL LACTATE 5 MG/ML IJ SOLN
1.0000 mg | Freq: Once | INTRAMUSCULAR | Status: AC
  Administered 2020-02-11: 1 mg via INTRAVENOUS

## 2020-02-11 MED ORDER — PHENYTOIN SODIUM 50 MG/ML IJ SOLN
100.0000 mg | Freq: Three times a day (TID) | INTRAMUSCULAR | Status: DC
  Administered 2020-02-11 – 2020-02-13 (×6): 100 mg via INTRAVENOUS
  Filled 2020-02-11 (×6): qty 2

## 2020-02-11 MED ORDER — ORAL CARE MOUTH RINSE
15.0000 mL | Freq: Two times a day (BID) | OROMUCOSAL | Status: DC
  Administered 2020-02-11 – 2020-02-12 (×4): 15 mL via OROMUCOSAL

## 2020-02-11 MED ORDER — ATROPINE SULFATE 1 MG/10ML IJ SOSY
PREFILLED_SYRINGE | INTRAMUSCULAR | Status: AC
  Filled 2020-02-11: qty 10

## 2020-02-11 MED ORDER — CHLORHEXIDINE GLUCONATE 0.12 % MT SOLN
15.0000 mL | Freq: Two times a day (BID) | OROMUCOSAL | Status: DC
  Administered 2020-02-11 – 2020-02-12 (×5): 15 mL via OROMUCOSAL
  Filled 2020-02-11 (×2): qty 15

## 2020-02-11 NOTE — Progress Notes (Signed)
Postop day 1.  Patient with multiple postoperative seizures.  No other issues or problems overnight.  Patient now on Dilantin and Keppra both.  Patient awakens to stimulation.  Appears minimally aware.  No efforts at verbalization.  Moving both sides purposefully without commands.  Strength reasonably equal.  Pupils 3 mm and reactive.  Gaze conjugate.  Wound clean and dry.  Drain output moderate.  Follow-up head CT scan demonstrates almost complete resolution of large subdural fluid collection.  Mass-effect is all.  No evidence of obvious underlying infarct or other significant structural lesion.  Status post craniotomy for large subacute subdural hematoma.  Patient with underlying cortical irritation and postoperative seizures.  Continue antiepileptic medications supportive efforts.

## 2020-02-11 NOTE — Progress Notes (Signed)
Patient with seizure at 0249 lasting approximately 60 secs. Full body tonic clonic seizure witnessed. Patient with snoring respirations and tachycardia after seizure and now back to neurological baseline prior to seizure activity. Patient became very tachycardic with very frequent PVCs. Neurosurgery notified. EKG performed. STAT head CT obtained.

## 2020-02-11 NOTE — Progress Notes (Signed)
On arrival to 4NICU, patient still nonverbal and not following commands. Patient very restless in bed and appears agitated. Difficulty obtaining blood pressures due to patient's restlessness. Patient also very diaphoretic. Temperature within normal limits and CBG obtained that resulted as 181. Christian Lowe with Neurosurgery notified. Posey waist belt and bilateral wrist restraints applied for patient safety and so patient will not fall out of bed or remove JP drain. Order for restraints received by MD. Christian Lowe stated would notify Dr. Annette Stable. Order obtained for Haldol 1 mg IV once and then Haldol 5 mg IV once if 1 mg not successful. Both doses of Haldol given. No improvement after first dose. Minimal improvement after second dose at this point. Will continue to monitor patient and let MD know if need for further interventions.

## 2020-02-11 NOTE — Progress Notes (Signed)
During recovery in PACU pt not following commands, not making eye contact and having non purposeful movements. Both anesthesia and surgeon notified of patients neuro status.  Pt then had a seizure at 2258 that lasted approximately 1 min. Starting out with twitching of the left side of his face into a full tonic clonic seizure. Versed was drawn up to give but pt stopped seizing so medication was not administered per MDA. MDA at bedside during event. Dr. Annette Stable notified and came to bedside as well. Pt wife updated by Dr. Annette Stable. Pt was then transported to 4N ICU.

## 2020-02-12 ENCOUNTER — Inpatient Hospital Stay (HOSPITAL_COMMUNITY): Payer: Medicare HMO

## 2020-02-12 MED ORDER — LORAZEPAM 2 MG/ML IJ SOLN
2.0000 mg | Freq: Once | INTRAMUSCULAR | Status: DC | PRN
  Filled 2020-02-12: qty 1

## 2020-02-12 NOTE — Progress Notes (Signed)
Postop day 2.  Overall patient looks much better.  Still somewhat agitated and restless.  He is wide awake.  He is somewhat confused.  His speech is much more fluent.  Motor examination is intact bilaterally.  His wound is clean and dry.  Follow-up head CT scan demonstrates good evacuation of the subdural hematoma.  His drain output remains somewhat high however.  He is afebrile.  Vital signs are stable.  Overall progressing well.  Continue dual drug antiepileptic medication.  Begin to mobilize.  Continued drain.Marland Kitchen

## 2020-02-12 NOTE — Evaluation (Signed)
Clinical/Bedside Swallow Evaluation Patient Details  Name: Marselino Slayton MRN: [id number] Date of Birth: Aug 24, 1940  Today's Date: 02/12/2020 Time: SLP Start Time (ACUTE ONLY): 1430 SLP Stop Time (ACUTE ONLY): 1450 SLP Time Calculation (min) (ACUTE ONLY): 20 min  Past Medical History:  Past Medical History:  Diagnosis Date  . BPPV (benign paroxysmal positional vertigo)   . Calculus of kidney   . Calculus of kidney   . Cancer ([place]) 2015   skin cancer removed off left forearm   . GERD (gastroesophageal reflux disease)   . History of basal cell carcinoma (BCC)   . Mixed hyperlipidemia   . Osteoarthritis   . Prediabetes   . Vitamin D deficiency    Past Surgical History:  Past Surgical History:  Procedure Laterality Date  . CRANIOTOMY Left 02/10/2020   Procedure: CRANIOTOMY HEMATOMA EVACUATION SUBDURAL;  Surgeon: Earnie Larsson, MD;  Location: [facility];  Service: Neurosurgery;  Laterality: Left;  . EYE SURGERY Bilateral 2012   cataracts w lens implants  . PROSTATECTOMY  2008   BPH  . 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 TEMPORAL ARTERY BIOPSY / LIGATION Bilateral 1997   negative  . TRANSURETHRAL RESECTION OF PROSTATE  20167   HPI:  79 year old with large subacute subdural with marked mass-effect and increasing symptoms s/p Left frontal parietal craniotomy and evacuation of subdural hematoma. Significant seizure activity in 24 hour period after surgery. Now stable.   Assessment / Plan / Recommendation Clinical Impression  Mr. Difatta demonstrates functional oropharyngeal swallow given regular solids and thin liquids. Pt passed the 3 oz challenge by consuming 3 oz consecutively without s/s aspiration. He had timely mastication and adequate oral clearance of solids. Oral mech exam was unremarkable with the exception of noted edema/bruising on L lingual blade, likely from biting during seizure. He denies pain. At this time, recommend regular solids and thin liquids with general aspiration  precautions.   SLP attempted cognitive-linguistic evaluation, however, pt perseverating on eating. Pt with definite cognitive deficits, confirmed by wife. He is using humor to mask deficits and would not give a straight answer, but said, "if you let me eat today, I'll do your tests tomorrow." Wife reports huge improvement in mentation from yesterday to today, so plan for evaluation tomorrow as schedule allows.     SLP Visit Diagnosis: Dysphagia, unspecified (R13.10)    Aspiration Risk  Mild aspiration risk    Diet Recommendation Regular;Thin liquid   Liquid Administration via: Straw;Cup Medication Administration: Whole meds with liquid Supervision: Patient able to self feed;Staff to assist with self feeding Compensations: Slow rate;Small sips/bites Postural Changes: Seated upright at 90 degrees    Other  Recommendations Oral Care Recommendations: Oral care BID;Staff/trained caregiver to provide oral care   Follow up Recommendations Home health SLP      Frequency and Duration min 2x/week  2 weeks       Prognosis   Good     Swallow Study   General Date of Onset: 02/10/20 HPI: 79 year old with large subacute subdural with marked mass-effect and increasing symptoms s/p Left frontal parietal craniotomy and evacuation of subdural hematoma. Significant seizure activity in 24 hour period after surgery. Now stable. Type of Study: Bedside Swallow Evaluation Previous Swallow Assessment: n/a Diet Prior to this Study: NPO Temperature Spikes Noted: No Respiratory Status: Room air History of Recent Intubation: Yes (brief (during surgery)) Length of Intubations (days): 1 days Date extubated: 02/10/20 Behavior/Cognition: Alert;Cooperative;Pleasant mood Oral Cavity Assessment: Within Functional Limits Oral Cavity - Dentition: Adequate  natural dentition Vision: Functional for self-feeding Self-Feeding Abilities: Needs assist (pt in soft restraints; likely able to self feed  otherwise) Patient Positioning: Upright in bed Baseline Vocal Quality: Normal Volitional Cough: Strong Volitional Swallow: Able to elicit    Oral/Motor/Sensory Function Overall Oral Motor/Sensory Function: Within functional limits (edema on L lingual blade with bruising/discoloration; pt denies pain)   Ice Chips Ice chips: Within functional limits Presentation: Spoon   Thin Liquid Thin Liquid: Within functional limits Presentation: Cup;Spoon;Straw    Solid     Solid: Within functional limits Presentation: [place]. Gretel Cantu, M.S., CCC-SLP Speech-Language Pathologist Acute Rehabilitation Services Pager: [place] 02/12/2020,3:02 PM

## 2020-02-12 NOTE — Progress Notes (Addendum)
Spoke to Dr. Christella Noa of Neurosurgery about patient's restlessness and the potential to not get a clear follow up CT image. New orders received for a one time dose of 2 mg IV ativan if needed for sedation. Will continue to monitor.

## 2020-02-12 NOTE — Anesthesia Postprocedure Evaluation (Signed)
Anesthesia Post Note  Patient: Christian Lowe  Procedure(s) Performed: CRANIOTOMY HEMATOMA EVACUATION SUBDURAL (Left Head)     Patient location during evaluation: PACU Anesthesia Type: General Level of consciousness: sedated and responds to stimulation Pain management: pain level controlled Vital Signs Assessment: post-procedure vital signs reviewed and stable Respiratory status: spontaneous breathing, nonlabored ventilation, respiratory function stable and patient connected to face mask oxygen Cardiovascular status: blood pressure returned to baseline and stable Postop Assessment: no apparent nausea or vomiting Anesthetic complications: no Comments: Post op seizure (generalized tonic clonic), self resolved prior to benzo administration. Patient post ictal, protecting airway, Dr Annette Stable evaluated and ok'd patient transfer to icu without further testing.    No complications documented.  Last Vitals:  Vitals:   02/12/20 0900 02/12/20 1000  BP: (!) 146/73 135/69  Pulse: 94 60  Resp: 16 13  Temp:    SpO2: 96% 97%    Last Pain:  Vitals:   02/12/20 0800  TempSrc: Oral  PainSc: 0-No pain                 Christian Lowe

## 2020-02-13 MED ORDER — SODIUM CHLORIDE 0.9 % IV SOLN: INTRAVENOUS | Status: DC | PRN

## 2020-02-13 MED ORDER — PANTOPRAZOLE SODIUM 40 MG PO TBEC
40.0000 mg | DELAYED_RELEASE_TABLET | Freq: Every day | ORAL | Status: DC
  Administered 2020-02-13 – 2020-02-14 (×2): 40 mg via ORAL
  Filled 2020-02-13 (×2): qty 1

## 2020-02-13 MED ORDER — LEVETIRACETAM 500 MG PO TABS
500.0000 mg | ORAL_TABLET | Freq: Two times a day (BID) | ORAL | Status: DC
  Administered 2020-02-13 – 2020-02-15 (×4): 500 mg via ORAL
  Filled 2020-02-13 (×4): qty 1

## 2020-02-13 NOTE — Progress Notes (Signed)
Pharmacy Note  Per discussion with Neurosurgery NP Viona Gilmore, change Keppra to PO and d/c phenytoin.   Arturo Morton, PharmD, BCPS Please check AMION for all [facility] contact numbers Clinical Pharmacist 02/13/2020 1:27 PM

## 2020-02-13 NOTE — Progress Notes (Signed)
   Providing Compassionate, Quality Care - Together   Subjective: Patient reports no new issues.  Objective: Vital signs in last 24 hours: Temp:  [97.8 F (36.6 C)-98.3 F (36.8 C)] 98.2 F (36.8 C) (07/09 0800) Pulse Rate:  [60-85] 85 (07/09 0800) Resp:  [11-19] 15 (07/09 0800) BP: (117-175)/(44-88) 119/50 (07/09 0700) SpO2:  [93 %-99 %] 94 % (07/09 0800) Weight:  [104.1 kg] 104.1 kg (07/08 1400)  Intake/Output from previous day: 07/08 0701 - 07/09 0700 In: 2073.4 [P.O.:120; I.V.:1753.4; IV Piggyback:200] Out: 1480 [Urine:1410; Drains:70] Intake/Output this shift: Total I/O In: 50.5 [I.V.:50.5] Out: -   Alert and oriented to person, place, and time; but not situation PERRLA CN II-XII grossly intact MAE, Strength and sensation intact, gait slightly unsteady Incision is closed with staples and covered with gauze and stocking cap; Dressing with small amount of old sanguinous drainage JP drain with 30 mL out overnight   Lab Results: Recent Labs    02/10/20 1628 02/10/20 1628 02/10/20 1652 02/11/20 0455  WBC 6.2  --   --  10.8*  HGB 16.1   < > 16.0 14.6  HCT 50.4   < > 47.0 45.7  PLT 236  --   --  242   < > = values in this interval not displayed.   BMET Recent Labs    02/10/20 1628 02/10/20 1628 02/10/20 1652 02/11/20 0455  NA 137   < > 140 137  K 3.9   < > 3.8 4.1  CL 105   < > 104 106  CO2 22  --   --  20*  GLUCOSE 116*   < > 116* 215*  BUN 14   < > 15 13  CREATININE 0.91   < > 0.90 1.04  CALCIUM 8.8*  --   --  8.2*   < > = values in this interval not displayed.    Studies/Results: CT HEAD WO CONTRAST  Result Date: 02/12/2020 CLINICAL DATA:  Subdural hematoma follow-up EXAM: CT HEAD WITHOUT CONTRAST TECHNIQUE: Contiguous axial images were obtained from the base of the skull through the vertex without intravenous contrast. COMPARISON:  Head CT 02/11/2020 FINDINGS: Brain: Unchanged position of subdural drainage catheter along the anterior left  convexity. The amount of subdural blood has slightly decreased, now measuring 10 mm in thickness. There is no new site of hemorrhage. No midline shift or other mass effect. Vascular: No hyperdense vessel or unexpected calcification. Skull: Normal. Negative for fracture or focal lesion. Sinuses/Orbits: No acute finding. Other: None. IMPRESSION: 1. Unchanged position of subdural drainage catheter along the anterior left convexity. The amount of subdural blood has slightly decreased, now measuring 10 mm in thickness. 2. No new site of hemorrhage. Electronically Signed   By: Ulyses Jarred M.D.   On: 02/12/2020 03:27    Assessment/Plan: Patient is three days status post left frontal parietal craniotomy for evacuation of subdural hematoma. Post-op course was complicated by seizure activity, but patient is responding well to dual antiepileptic therapy.    LOS: 3 days    -Will likely transfer from ICU tomorrow -Continue to mobilize with therapies -JP drain removed and site closed with one staple   Viona Gilmore, DNP, AGNP-C Nurse Practitioner  [facility] [place]. [address], [place], [place] [address] P: [phone]    F: [phone]  02/13/2020, 9:31 AM

## 2020-02-13 NOTE — Evaluation (Signed)
Occupational Therapy Evaluation Patient Details Name: Christian Lowe MRN: [id number] DOB: [birth year] Today's Date: 02/13/2020    History of Present Illness 79 year old male approximately 2 months status post fall from ladder with mild traumatic brain injury.  Patient reports that over the past few days he has had increasing difficulty with headache and not feeling well.  Patient also notes some difficulty with right upper and right lower extremity weakness.  Patient's wife also notices that he is beginning to have some speech and language difficulty. 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 Pt underwent Left frontal parietal craniotomy and evacuation of subdural hematoma on 02/10/2020. PMH significant for BPPV, skin cancer, OA.   Clinical Impression   Pt admitted with above. He demonstrates the below listed deficits and will benefit from continued OT to maximize safety and independence with BADLs.  Pt presents to OT with generalized weakness, mild balance deficits, as well as cognitive deficits.  He is able to perform ADLs with supervision. He scored 10/28 on the Short Blessed Test which is indicative of cognitive deficit. He demonstrated deficits with attention, sequencing, problem solving, organization, and set shifting.  He lives with his wife and was fully independent PTA.  Recommend 24 hour supervision initially.  Direct assist/supervision with medications, financial management, and cooking.  No driving, no operating power tools or lawn equipment.  Pt and family verbalized understanding       Follow Up Recommendations  Outpatient OT;Supervision/Assistance - 24 hour    Equipment Recommendations  None recommended by OT    Recommendations for Other Services       Precautions / Restrictions Precautions Precautions: Fall      Mobility Bed Mobility               General bed mobility comments: pt up in chair   Transfers Overall transfer level: Needs  assistance Equipment used: None Transfers: Sit to/from Stand;Stand Pivot Transfers Sit to Stand: Supervision Stand pivot transfers: Supervision            Balance Overall balance assessment: Needs assistance Sitting-balance support: No upper extremity supported;Feet supported Sitting balance-Leahy Scale: Good     Standing balance support: No upper extremity supported Standing balance-Leahy Scale: Fair                             ADL either performed or assessed with clinical judgement   ADL Overall ADL's : Needs assistance/impaired Eating/Feeding: Independent   Grooming: Wash/dry hands;Wash/dry face;Oral care;Brushing hair;Supervision/safety;Standing   Upper Body Bathing: Set up;Sitting   Lower Body Bathing: Supervison/ safety;Sit to/from stand   Upper Body Dressing : Set up;Sitting   Lower Body Dressing: Supervision/safety;Sit to/from stand   Toilet Transfer: Supervision/safety;Ambulation   Toileting- Clothing Manipulation and Hygiene: Supervision/safety;Sit to/from stand   Tub/ Shower Transfer: Supervision/safety;Ambulation   Functional mobility during ADLs: Supervision/safety       Vision Baseline Vision/History: Wears glasses Wears Glasses: Reading only Patient Visual Report: No change from baseline Vision Assessment?: Yes Eye Alignment: Within Functional Limits Ocular Range of Motion: Within Functional Limits Alignment/Gaze Preference: Within Defined Limits Tracking/Visual Pursuits: Able to track stimulus in all quads without difficulty Saccades: Within functional limits Visual Fields: No apparent deficits Additional Comments: Pt did have difficulty with pursuits initially, but was able to perform them smoothly after repeat instruction and auditory cues for attention      Perception Perception Perception Tested?: Yes   Praxis Praxis Praxis tested?:  Within functional limits    Pertinent Vitals/Pain Pain Assessment: No/denies pain      Hand Dominance Right   Extremity/Trunk Assessment Upper Extremity Assessment Upper Extremity Assessment: Overall WFL for tasks assessed   Lower Extremity Assessment Lower Extremity Assessment: Overall WFL for tasks assessed   Cervical / Trunk Assessment Cervical / Trunk Assessment: Normal   Communication Communication Communication: Expressive difficulties   Cognition Arousal/Alertness: Awake/alert Behavior During Therapy: WFL for tasks assessed/performed Overall Cognitive Status: Impaired/Different from baseline Area of Impairment: Attention;Memory;Following commands;Safety/judgement;Awareness;Problem solving                   Current Attention Level: Sustained Memory: Decreased short-term memory Following Commands: Follows one step commands consistently;Follows multi-step commands inconsistently Safety/Judgement: Decreased awareness of safety;Decreased awareness of deficits Awareness: Intellectual Problem Solving: Slow processing;Difficulty sequencing;Requires verbal cues;Requires tactile cues General Comments: pt is able to state what happened to him, but will state he is unsure why he is here in hospital.  speech is halting.  He was given 2 and 3 step commands and was only able to complete one of the commands before forgetting what else was asked of him.  The Short Blessed Test was administered with pt scoring 10/28 which is indicative of a cognitive impairment.  He demonstrated deficits with memory, sequencing, attention.   Discussed results with pt , wife, and daughter.  Recommend that he have 24 hour supervision initially, direct supervision/assist with medication management, financial management, cooking.  No driving at this time, and avoid use of power equipment and lawn equipment.  They all verbalized understanding    General Comments  VSS     Exercises     Shoulder Instructions      Home Living Family/patient expects to be discharged to:: Private  residence Living Arrangements: Spouse/significant other;Other relatives Available Help at Discharge: Family;Available 24 hours/day Type of Home: House Home Access: Stairs to enter [employer] of Steps: 3 Entrance Stairs-Rails: None Home Layout: One level     Bathroom Shower/Tub: Occupational psychologist: Standard     Home Equipment: None          Prior Functioning/Environment Level of Independence: Independent        Comments: Pt is typically very active.  Drives, does household repairs, and maintenance as well as yard maintenance         OT Problem List: Decreased cognition;Decreased safety awareness      OT Treatment/Interventions: Self-care/ADL training;DME and/or AE instruction;Therapeutic activities;Cognitive remediation/compensation;Patient/family education    OT Goals(Current goals can be found in the care plan section) Acute Rehab OT Goals Patient Stated Goal: to get home  OT Goal Formulation: With patient/family Time For Goal Achievement: 02/27/20 Potential to Achieve Goals: Good ADL Goals Additional ADL Goal #1: Pt will consistently be able to follow a 2 step command during ADLs and path finding tasks Additional ADL Goal #2: Pt will be able to state how deficits will impact him with min cues  OT Frequency: Min 2X/week   Barriers to D/C:            Co-evaluation              AM-PAC OT "6 Clicks" Daily Activity     Outcome Measure Help from another person eating meals?: None Help from another person taking care of personal grooming?: A Little Help from another person toileting, which includes using toliet, bedpan, or urinal?: A Little Help from another person bathing (including washing, rinsing, drying)?: A Little  Help from another person to put on and taking off regular upper body clothing?: A Little Help from another person to put on and taking off regular lower body clothing?: A Little 6 Click Score: 19   End of Session  Nurse Communication: Mobility status  Activity Tolerance: Patient tolerated treatment well Patient left: in chair;with call bell/phone within reach;with family/visitor present  OT Visit Diagnosis: 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

## 2020-02-13 NOTE — Evaluation (Addendum)
Physical Therapy Evaluation Patient Details Name: Christian Lowe MRN: [id number] DOB: 08/13/1940 Today's Date: 02/13/2020   History of Present Illness  79 year old male approximately 2 months status post fall from ladder with mild traumatic brain injury.  Patient reports that over the past few days he has had increasing difficulty with headache and not feeling well.  Patient also notes some difficulty with right upper and right lower extremity weakness.  Patient's wife also notices that he is beginning to have some speech and language difficulty. 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 Pt underwent Left frontal parietal craniotomy and evacuation of subdural hematoma on 02/10/2020. PMH significant for BPPV, skin cancer, OA.  Clinical Impression  Pt presents to PT with deficits in gait, balance, cognition, awareness of deficits, and safety awareness. Pt is unsteady at this time with increased lateral sway. Pt with significant cognitive impairments and impaired  Communication at this time with signs of expressive aphasia. PT does not recall having any deficits prior to admission or surgery and is confused why it needed to happen as he does recognize he is not steady with ambulation currently. Pt will continue to benefit form acute PT POC to improve mobility quality and balance, and to aide in improving awareness of deficits and safety. PT currently recommends HHPT and a cane, however pt may progress to no DME needs by the time of discharge.    Follow Up Recommendations Outpatient PT    Equipment Recommendations  Kasandra Knudsen (may progress to no needs)    Recommendations for Other Services       Precautions / Restrictions Precautions Precautions: Fall Restrictions Weight Bearing Restrictions: No      Mobility  Bed Mobility               General bed mobility comments: pt received up in chair  Transfers Overall transfer level: Needs assistance Equipment used:  None Transfers: Sit to/from Stand Sit to Stand: Supervision            Ambulation/Gait Ambulation/Gait assistance: Min guard;Supervision Gait Distance (Feet): 200 Feet Assistive device: None Gait Pattern/deviations: Step-through pattern Gait velocity: reduced Gait velocity interpretation: <1.8 ft/sec, indicate of risk for recurrent falls General Gait Details: pt with shortened stride length, slight increase in lateral sway, pt intermittently reaching for objects to grasp when passing  Stairs            Wheelchair Mobility    Modified Rankin (Stroke Patients Only)       Balance Overall balance assessment: Needs assistance Sitting-balance support: No upper extremity supported;Feet supported Sitting balance-Leahy Scale: Good     Standing balance support: No upper extremity supported Standing balance-Leahy Scale: Fair Standing balance comment: able to maintain static balance without UE support                             Pertinent Vitals/Pain Pain Assessment: No/denies pain    Home Living Family/patient expects to be discharged to:: Private residence Living Arrangements: Spouse/significant other;Other relatives Available Help at Discharge: Family;Available 24 hours/day Type of Home: House Home Access: Stairs to enter Entrance Stairs-Rails: None Entrance Stairs-Number of Steps: 3 Home Layout: One level        Prior Function Level of Independence: Independent               Hand Dominance        Extremity/Trunk Assessment   Upper Extremity Assessment Upper Extremity Assessment: Overall  WFL for tasks assessed    Lower Extremity Assessment Lower Extremity Assessment: Overall WFL for tasks assessed    Cervical / Trunk Assessment Cervical / Trunk Assessment: Normal  Communication   Communication: Expressive difficulties  Cognition Arousal/Alertness: Awake/alert Behavior During Therapy: WFL for tasks assessed/performed Overall  Cognitive Status: Impaired/Different from baseline Area of Impairment: Orientation;Attention;Memory;Following commands;Safety/judgement;Awareness;Problem solving                 Orientation Level: Disoriented to;Time;Situation Current Attention Level: Focused Memory: Decreased recall of precautions;Decreased short-term memory Following Commands: Follows one step commands consistently Safety/Judgement: Decreased awareness of safety;Decreased awareness of deficits Awareness: Emergent Problem Solving: Slow processing;Requires verbal cues General Comments: pt is oriented to year but not month or day, does not remember having any deficits prior to surgery      General Comments General comments (skin integrity, edema, etc.): VSS on RA    Exercises     Assessment/Plan    PT Assessment Patient needs continued PT services  PT Problem List Decreased activity tolerance;Decreased balance;Decreased mobility;Decreased cognition;Decreased knowledge of use of DME;Decreased safety awareness;Decreased knowledge of precautions       PT Treatment Interventions DME instruction;Gait training;Stair training;Functional mobility training;Therapeutic activities;Therapeutic exercise;Balance training;Neuromuscular re-education;Cognitive remediation;Patient/family education    PT Goals (Current goals can be found in the Care Plan section)  Acute Rehab PT Goals Patient Stated Goal: To improve mobility and return to independence PT Goal Formulation: With patient Time For Goal Achievement: 02/27/20 Potential to Achieve Goals: Good Additional Goals Additional Goal #1: Pt will maintain dynamic standing balance within 10 inches of his base of support with supervision and unilateral UE support    Frequency Min 4X/week   Barriers to discharge        Co-evaluation               AM-PAC PT "6 Clicks" Mobility  Outcome Measure Help needed turning from your back to your side while in a flat bed  without using bedrails?: A Little Help needed moving from lying on your back to sitting on the side of a flat bed without using bedrails?: A Little Help needed moving to and from a bed to a chair (including a wheelchair)?: None Help needed standing up from a chair using your arms (e.g., wheelchair or bedside chair)?: None Help needed to walk in hospital room?: A Little Help needed climbing 3-5 steps with a railing? : A Lot 6 Click Score: 19    End of Session   Activity Tolerance: Patient tolerated treatment well Patient left: in chair;with call bell/phone within reach;with chair alarm set Nurse Communication: Mobility status PT Visit Diagnosis: Other abnormalities of gait and mobility (R26.89);Unsteadiness on feet (R26.81)    Time: 3570-1779 PT Time Calculation (min) (ACUTE ONLY): 26 min   Charges:   PT Evaluation $PT Eval Moderate Complexity: 1 Mod PT Treatments $Gait Training: 8-22 mins        Zenaida Niece, PT, DPT Acute Rehabilitation Pager: [phone]   Zenaida Niece 02/13/2020, 9:17 AM

## 2020-02-14 NOTE — Progress Notes (Signed)
Physical Therapy Treatment Patient Details Name: Christian Lowe MRN: [id number] DOB: 1941/05/09 Today's Date: 02/14/2020    History of Present Illness 79 year old male approximately 2 months status post fall from ladder with mild traumatic brain injury.  Patient reports that over the past few days he has had increasing difficulty with headache and not feeling well.  Patient also notes some difficulty with right upper and right lower extremity weakness.  Patient's wife also notices that he is beginning to have some speech and language difficulty. 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 Pt underwent Left frontal parietal craniotomy and evacuation of subdural hematoma on 02/10/2020. PMH significant for BPPV, skin cancer, OA.    PT Comments    Patient moving better today. No overt loss of balance with walking, changing velocity, sudden stops, turns. Patient's quality of gait improved with incr distance and does not appear to need a cane as previously documented.     Follow Up Recommendations  Outpatient PT     Equipment Recommendations  None recommended by PT    Recommendations for Other Services       Precautions / Restrictions Precautions Precautions: Fall    Mobility  Bed Mobility               General bed mobility comments: pt up in chair   Transfers Overall transfer level: Needs assistance Equipment used: None Transfers: Sit to/from Stand;Stand Pivot Transfers Sit to Stand: Supervision         General transfer comment: no imbalance; supervision for lines  Ambulation/Gait Ambulation/Gait assistance: Min guard;Supervision Gait Distance (Feet): 300 Feet Assistive device: None Gait Pattern/deviations: Step-through pattern Gait velocity: reduced Gait velocity interpretation: <1.8 ft/sec, indicate of risk for recurrent falls General Gait Details: pt with shortened stride length; can incr velocity when asked   Stairs              Wheelchair Mobility    Modified Rankin (Stroke Patients Only)       Balance Overall balance assessment: Needs assistance Sitting-balance support: No upper extremity supported;Feet supported Sitting balance-Leahy Scale: Good     Standing balance support: No upper extremity supported Standing balance-Leahy Scale: Fair Standing balance comment: able to maintain static balance without UE support             High level balance activites: Direction changes;Turns;Head turns;Sudden stops High Level Balance Comments: no overt LOB or sway noted            Cognition Arousal/Alertness: Awake/alert Behavior During Therapy: WFL for tasks assessed/performed Overall Cognitive Status: Impaired/Different from baseline Area of Impairment: Attention;Memory;Following commands;Safety/judgement;Awareness;Problem solving                   Current Attention Level: Sustained Memory: Decreased short-term memory Following Commands: Follows one step commands consistently;Follows multi-step commands inconsistently Safety/Judgement: Decreased awareness of safety;Decreased awareness of deficits Awareness: Intellectual Problem Solving: Slow processing;Difficulty sequencing;Requires verbal cues;Requires tactile cues        Exercises      General Comments        Pertinent Vitals/Pain Pain Assessment: No/denies pain    Home Living                      Prior Function            PT Goals (current goals can now be found in the care plan section) Acute Rehab PT Goals Patient Stated Goal: to get home  Time For Goal  Achievement: 02/27/20 Potential to Achieve Goals: Good Progress towards PT goals: Progressing toward goals    Frequency    Min 4X/week      PT Plan Current plan remains appropriate    Co-evaluation              AM-PAC PT "6 Clicks" Mobility   Outcome Measure  Help needed turning from your back to your side while in a flat bed without  using bedrails?: None Help needed moving from lying on your back to sitting on the side of a flat bed without using bedrails?: None Help needed moving to and from a bed to a chair (including a wheelchair)?: None Help needed standing up from a chair using your arms (e.g., wheelchair or bedside chair)?: None Help needed to walk in hospital room?: A Little Help needed climbing 3-5 steps with a railing? : A Lot 6 Click Score: 21    End of Session   Activity Tolerance: Patient tolerated treatment well Patient left: in chair;with call bell/phone within reach;with chair alarm set Nurse Communication: Mobility status PT Visit Diagnosis: Other abnormalities of gait and mobility (R26.89);Unsteadiness on feet (R26.81)     Time: 1134-1150 PT Time Calculation (min) (ACUTE ONLY): 16 min  Charges:  $Gait Training: 8-22 mins                      Arby Barrette, PT Pager [phone]    Rexanne Mano 02/14/2020, 12:46 PM

## 2020-02-14 NOTE — Progress Notes (Signed)
Subjective: Patient reports mo current complaints or acute issues overnight. He stated that he feels that he is ready to be discharged to home.   Objective: Vital signs in last 24 hours: Temp:  [98 F (36.7 C)-98.5 F (36.9 C)] 98.1 F (36.7 C) (07/10 0800) Pulse Rate:  [30-93] 66 (07/10 0700) Resp:  [12-25] 16 (07/10 0700) BP: (100-162)/(60-91) 155/72 (07/10 0700) SpO2:  [89 %-100 %] 92 % (07/10 0700)  Intake/Output from previous day: 07/09 0701 - 07/10 0700 In: 152.1 [I.V.:51.9; IV Piggyback:100.2] Out: 550 [Urine:550] Intake/Output this shift: No intake/output data recorded.  Physical Exam: The patient is alert and oriented to person, place, and time; but not situation. However, he did state he had brain surgery but is amnestic to the surgery. PERRLA. CN II-XII grossly intact. MAEW, Strength and sensation intact, gait slightly unsteady Incision is closed with staples and is open to air with no drainage, erythema or edema.   Lab Results: No results for input(s): WBC, HGB, HCT, PLT in the last 72 hours. BMET No results for input(s): NA, K, CL, CO2, GLUCOSE, BUN, CREATININE, CALCIUM in the last 72 hours.  Studies/Results: No results found.  Assessment/Plan: Patient is 4 days s/p left frontal parietal craniotomy for evacuation of subdural hematoma. Post-op course was complicated by seizure activity, but patient appears to be responding well to dual antiepileptic therapy. Continue to mobilize today.  If doing well, may discharge tomorrow.    LOS: 4 days    Peggyann Shoals, MD 02/14/2020, 8:43 AM

## 2020-02-14 NOTE — Plan of Care (Signed)
  Problem: Education: Goal: Knowledge of General Education information will improve Description Including pain rating scale, medication(s)/side effects and non-pharmacologic comfort measures Outcome: Progressing   

## 2020-02-14 NOTE — Evaluation (Signed)
Speech Language Pathology Evaluation Patient Details Name: Christian Lowe MRN: [id number] DOB: Nov 28, 1940 Today's Date: 02/14/2020 Time: 1157-2620 SLP Time Calculation (min) (ACUTE ONLY): 32 min  Problem List:  Patient Active Problem List   Diagnosis Date Noted  . Status post craniotomy 02/10/2020  . SDH (subdural hematoma) ([place]) 02/10/2020  . Acute pain of right shoulder 11/10/2019  . Closed fracture of body of sternum 11/10/2019  . Closed fracture of right orbit ([place]) 11/10/2019  . Contusion of right thigh 11/10/2019  . Closed fracture of frontal bone ([place]) 11/10/2019  . Subdural hematoma ([place]) 11/10/2019   Past Medical History:  Past Medical History:  Diagnosis Date  . BPPV (benign paroxysmal positional vertigo)   . Calculus of kidney   . Calculus of kidney   . Cancer ([place]) 2015   skin cancer removed off left forearm   . GERD (gastroesophageal reflux disease)   . History of basal cell carcinoma (BCC)   . Mixed hyperlipidemia   . Osteoarthritis   . Prediabetes   . Vitamin D deficiency    Past Surgical History:  Past Surgical History:  Procedure Laterality Date  . CRANIOTOMY Left 02/10/2020   Procedure: CRANIOTOMY HEMATOMA EVACUATION SUBDURAL;  Surgeon: Earnie Larsson, MD;  Location: [facility];  Service: Neurosurgery;  Laterality: Left;  . EYE SURGERY Bilateral 2012   cataracts w lens implants  . PROSTATECTOMY  2008   BPH  . 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 TEMPORAL ARTERY BIOPSY / LIGATION Bilateral 1997   negative  . TRANSURETHRAL RESECTION OF PROSTATE  20152   HPI:  79 year old male approximately 2 months status post fall from ladder with mild traumatic brain injury.  Patient reports that over the past few days he has had increasing difficulty with headache and not feeling well.  Patient also notes some difficulty with right upper and right lower extremity weakness.  Patient's wife also notices that he is beginning to have some speech and language difficulty. 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 Pt underwent Left frontal parietal craniotomy and evacuation of subdural hematoma on 02/10/2020. PMH significant for BPPV, skin cancer, OA.   Assessment / Plan / Recommendation Clinical Impression  Pt presents with mild neurocognitive disorder, scoring a 23/30 per the SLUMs.  He had difficulty in areas of problem solving, list generation, and selective attention.  There were mild deficits in recall, but when primed to remember verbal information, performance improved.  Pt's speech was clear without dysarthria; output was fluent.  Expressive and receptive language were WNL.  Pt would benefit from OP SLP services upon D/C, particularly given his high level of functioning/independence PTA.  He verbalizes agreement.  He should have 24 hour supervision, at least initially after D/C home.     SLP Assessment  SLP Recommendation/Assessment: All further Speech Lanaguage Pathology  needs can be addressed in the next venue of care SLP Visit Diagnosis: Cognitive communication deficit (R41.841)    Follow Up Recommendations  Outpatient SLP    Frequency and Duration           SLP Evaluation Cognition  Overall Cognitive Status: Impaired/Different from baseline Arousal/Alertness: Awake/alert Orientation Level: Oriented X4 Attention: Sustained;Selective Sustained Attention: Appears intact Selective Attention: Impaired Selective Attention Impairment: Verbal basic Memory: Impaired Memory Impairment: Storage deficit;Retrieval deficit Awareness: Impaired Problem Solving: Impaired Problem Solving Impairment: Verbal basic       Comprehension  Auditory Comprehension Overall Auditory Comprehension: Appears within functional limits for tasks assessed Visual Recognition/Discrimination  Discrimination: Within Function Limits Reading Comprehension Reading Status: Not tested    Expression Expression Primary Mode of Expression: Verbal Verbal  Expression Overall Verbal Expression: Appears within functional limits for tasks assessed Written Expression Dominant Hand: Left Written Expression: Not tested   Oral / Motor  Oral Motor/Sensory Function Overall Oral Motor/Sensory Function: Within functional limits Motor Speech Overall Motor Speech: Appears within functional limits for tasks assessed   GO                   Christian Lowe Christian Lowe, [place] Office number [phone] Pager [phone]  Christian Lowe 02/14/2020, 9:30 AM

## 2020-02-15 LAB — URINE CULTURE

## 2020-02-15 MED ORDER — LEVETIRACETAM 500 MG PO TABS: 500.0000 mg | ORAL_TABLET | Freq: Two times a day (BID) | ORAL | 0 refills | Status: DC

## 2020-02-15 NOTE — Discharge Instructions (Signed)
Subdural Hematoma Evacuation, Care After This sheet gives you information about how to care for yourself after your procedure. Your health care provider may also give you more specific instructions. If you have problems or questions, contact your health care provider. What can I expect after the procedure? After the procedure, it is common to have:  Pain in your scalp, especially in the incision area. You will be given pain medicines to control this.  Constipation.  Headaches. Follow these instructions at home: Incision care   Follow instructions from your health care provider about how to take care of your incision. Make sure you: ? Wash your hands with soap and water before you change your bandage (dressing). If soap and water are not available, use hand sanitizer. ? Change your dressing as told by your health care provider. ? Leave stitches (sutures), skin glue, or adhesive strips in place. These skin closures may need to stay in place for 2 weeks or longer. If adhesive strip edges start to loosen and curl up, you may trim the loose edges. Do not remove adhesive strips completely unless your health care provider tells you to do that.  Check your incision area every day for signs of infection. Check for: ? More redness, swelling, or pain. ? More fluid or blood. ? Warmth. ? Pus or a bad smell. Medicines  Take over-the-counter and prescription medicines only as told by your health care provider. Do not take blood thinners or NSAIDs unless your health care provider approves. These include aspirin, ibuprofen, naproxen, and warfarin.  If you were prescribed an antibiotic medicine, use it as told by your health care provider. Do not stop using the antibiotic even if you start to feel better. Activity   Avoid any situation where there is potential for another head injury, such as football, hockey, soccer, basketball, martial arts, downhill snow sports, and horseback riding. Do not do these  activities until your health care provider approves. ? If you play a contact sport and you experience a head injury, follow advice from your health care provider about when you can return to the sport. If you get another injury while you are healing, you may experience another hemorrhage.  Avoid excessive visual stimulation while recovering. This includes working on the computer, watching TV, and reading.  Try to avoid activities that cause physical or mental stress. Stay home from work or school as directed by your health care provider.  Do not drive, ride a bicycle, or use heavy machinery until your health care provider approves.  Do not lift anything that is heavier than 5 lb (2.3 kg) until your health care provider approves.  If physical therapy was prescribed, do exercises as told by your health care provider or physical therapist.  Rest as told by your health care provider. Rest helps the brain to heal. Make sure you: ? Get plenty of sleep. Avoid staying up late at night. ? Keep a consistent sleep schedule. Try to go to sleep and wake up at about the same time every day. ? Avoid activities that cause physical or mental stress. General instructions  To prevent or treat constipation while you are taking prescription pain medicine, your health care provider may recommend that you: ? Drink enough fluid to keep your urine clear or pale yellow. ? Take over-the-counter or prescription medicines. ? Eat foods that are high in fiber, such as fresh fruits and vegetables, whole grains, and beans. ? Limit foods that are high in fat and processed  sugars, such as fried and sweet foods.  Do not take baths, swim, or use a hot tub until your health care provider approves. You may take showers as directed by your health care provider.  Limit alcohol intake to no more than 1 drink per day for nonpregnant women and 2 drinks per day for men. One drink equals 12 oz of beer, 5 oz of wine, or 1 oz of hard  liquor.  Keep all follow-up visits as told by your health care provider. This is important. Contact a health care provider if:  You have a fever.  You have pain that does not get better with medicine.  You have no appetite.  You have constipation that does not get better with stool softeners.  You have more redness, swelling, or pain around your incision.  You have more fluid or blood coming from your incision.  Your incision feels warm to the touch.  You have pus or a bad smell coming from your incision. Get help right away if:  Your incision opens.  You have severe headaches.  You develop confusion.  You have jerky movements that you cannot control (seizure).  You develop weakness or numbness on one side of your body.  You have nausea or vomiting.  You have vision problems.  You have chest pain.  You have trouble breathing.  You have pain or swelling in your calves. This information is not intended to replace advice given to you by your health care provider. Make sure you discuss any questions you have with your health care provider. Document Revised: 07/06/2017 Document Reviewed: 06/16/2016 Elsevier Patient Education  [place].   Subdural Hematoma Evacuation, Care After This sheet gives you information about how to care for yourself after your procedure. Your health care provider may also give you more specific instructions. If you have problems or questions, contact your health care provider. What can I expect after the procedure? After the procedure, it is common to have:  Pain in your scalp, especially in the incision area. You will be given pain medicines to control this.  Constipation.  Headaches. Follow these instructions at home: Incision care   Follow instructions from your health care provider about how to take care of your incision. Make sure you: ? Wash your hands with soap and water before you change your bandage (dressing). If  soap and water are not available, use hand sanitizer. ? Change your dressing as told by your health care provider. ? Leave stitches (sutures), skin glue, or adhesive strips in place. These skin closures may need to stay in place for 2 weeks or longer. If adhesive strip edges start to loosen and curl up, you may trim the loose edges. Do not remove adhesive strips completely unless your health care provider tells you to do that.  Check your incision area every day for signs of infection. Check for: ? More redness, swelling, or pain. ? More fluid or blood. ? Warmth. ? Pus or a bad smell. Medicines  Take over-the-counter and prescription medicines only as told by your health care provider. Do not take blood thinners or NSAIDs unless your health care provider approves. These include aspirin, ibuprofen, naproxen, and warfarin.  If you were prescribed an antibiotic medicine, use it as told by your health care provider. Do not stop using the antibiotic even if you start to feel better. Activity   Avoid any situation where there is potential for another head injury, such as football, hockey, soccer,  basketball, martial arts, downhill snow sports, and horseback riding. Do not do these activities until your health care provider approves. ? If you play a contact sport and you experience a head injury, follow advice from your health care provider about when you can return to the sport. If you get another injury while you are healing, you may experience another hemorrhage.  Avoid excessive visual stimulation while recovering. This includes working on the computer, watching TV, and reading.  Try to avoid activities that cause physical or mental stress. Stay home from work or school as directed by your health care provider.  Do not drive, ride a bicycle, or use heavy machinery until your health care provider approves.  Do not lift anything that is heavier than 5 lb (2.3 kg) until your health care provider  approves.  If physical therapy was prescribed, do exercises as told by your health care provider or physical therapist.  Rest as told by your health care provider. Rest helps the brain to heal. Make sure you: ? Get plenty of sleep. Avoid staying up late at night. ? Keep a consistent sleep schedule. Try to go to sleep and wake up at about the same time every day. ? Avoid activities that cause physical or mental stress. General instructions  To prevent or treat constipation while you are taking prescription pain medicine, your health care provider may recommend that you: ? Drink enough fluid to keep your urine clear or pale yellow. ? Take over-the-counter or prescription medicines. ? Eat foods that are high in fiber, such as fresh fruits and vegetables, whole grains, and beans. ? Limit foods that are high in fat and processed sugars, such as fried and sweet foods.  Do not take baths, swim, or use a hot tub until your health care provider approves. You may take showers as directed by your health care provider.  Limit alcohol intake to no more than 1 drink per day for nonpregnant women and 2 drinks per day for men. One drink equals 12 oz of beer, 5 oz of wine, or 1 oz of hard liquor.  Keep all follow-up visits as told by your health care provider. This is important. Contact a health care provider if:  You have a fever.  You have pain that does not get better with medicine.  You have no appetite.  You have constipation that does not get better with stool softeners.  You have more redness, swelling, or pain around your incision.  You have more fluid or blood coming from your incision.  Your incision feels warm to the touch.  You have pus or a bad smell coming from your incision. Get help right away if:  Your incision opens.  You have severe headaches.  You develop confusion.  You have jerky movements that you cannot control (seizure).  You develop weakness or numbness on  one side of your body.  You have nausea or vomiting.  You have vision problems.  You have chest pain.  You have trouble breathing.  You have pain or swelling in your calves. This information is not intended to replace advice given to you by your health care provider. Make sure you discuss any questions you have with your health care provider. Document Revised: 07/06/2017 Document Reviewed: 06/16/2016 Elsevier Patient Education  2020 [employer].

## 2020-02-15 NOTE — Discharge Summary (Signed)
Physician Discharge Summary  Patient ID: Christian Lowe MRN: [id number] DOB/AGE: Dec 10, 1940 79 y.o.  Admit date: 02/10/2020 Discharge date: 02/15/2020  Admission Diagnoses: Left Subdural Hematoma  Discharge Diagnoses: Left Subdural Hematoma Active Problems:   Status post craniotomy   SDH (subdural hematoma) (HCC)   Discharged Condition: good  Hospital Course: 79 year old with large subacute subdural with marked mass-effect and increasing symptoms. A left frontal parietal craniotomy and evacuation of subdural hematoma was performed. He was admitted to the neuro ICU after his surgery to receive nursing care and frequent neurological assessments. Postop day 1. Patient with multiple postoperative seizures. No other issues or problems overnight. Patient now on Dilantin and Keppra both. Follow-up head CT scan demonstrates almost complete resolution of large subdural fluid collection. Mass-effect is all. No evidence of obvious underlying infarct or other significant structural lesion. Postop day 2. Overall patient looks much better and he is ready to be discharged to home.    Consults: None  Significant Diagnostic Studies: radiology: CT scan: CT Head  Treatments: surgery: Left frontal parietal craniotomy and evacuation of subdural hematoma  Discharge Exam: Blood pressure 135/67, pulse 62, temperature 97.9 F (36.6 C), temperature source Oral, resp. rate 15, height 5' 11"  (1.803 m), weight 104.1 kg, SpO2 92 %.  The patient is alert and orientedto person, place, and time; but not situation. However, he did state he had brain surgery but is amnestic to the surgery.PERRLA.CN II-XII grossly intact.MAEW, Strength and sensation intact, gait slightly unsteady Incision isclosed with staples andis open to air with nodrainage, erythema or edema.  Disposition: Discharge disposition: 01-Home or Self Care     Discharge Instructions    Ambulatory referral to  Occupational Therapy   Complete by: As directed    Ambulatory referral to Physical Therapy   Complete by: As directed    Ambulatory referral to Speech Therapy   Complete by: As directed      Allergies as of 02/15/2020      Reactions   Statins    myalgias      Medication List    STOP taking these medications   oxyCODONE-acetaminophen 5-325 MG tablet Commonly known as: PERCOCET/ROXICET   triamcinolone cream 0.1 % Commonly known as: KENALOG     TAKE these medications   levETIRAcetam 500 MG tablet Commonly known as: KEPPRA Take 1 tablet (500 mg total) by mouth 2 (two) times daily.   naproxen 500 MG tablet Commonly known as: NAPROSYN TAKE 1 TABLET (500 MG TOTAL) BY MOUTH 2 (TWO) TIMES DAILY WITH A MEAL. AS NEEDED FOR PAIN. What changed:   when to take this  reasons to take this  additional instructions   omeprazole 20 MG capsule Commonly known as: PRILOSEC Take 20 mg by mouth daily before breakfast.   Testosterone Cypionate 200 MG/ML Kit Inject 200 mg into the muscle once a week. 1 ml every 2 weeks       Follow-up Information    [facility].   Specialty: Rehabilitation Why: This office will call you to arrange outpatient physical, occupational, speech therapy. Please call if you don't hear from them by Wednesday.  Contact information: [address] [place] [id number] [place] [address] [phone]              Signed: Peggyann Shoals, MD 02/15/2020, 1:21 PM

## 2020-02-15 NOTE — Discharge Summary (Signed)
Physician Discharge Summary  Patient ID: Christian Lowe MRN: [id number] DOB/AGE: [birth year] 80 y.o.  Admit date: 02/10/2020 Discharge date: 02/15/2020  Admission Diagnoses: Left Subdural Hematoma  Discharge Diagnoses: Left Subdural Hematoma Active Problems:   Status post craniotomy   SDH (subdural hematoma) (HCC)   Discharged Condition: good  Hospital Course: 79 year old with large subacute subdural with marked mass-effect and increasing symptoms. A left frontal parietal craniotomy and evacuation of subdural hematoma was performed. He was admitted to the neuro ICU after his surgery to receive nursing care and frequent neurological assessments. Postop day 1.  Patient with multiple postoperative seizures.  No other issues or problems overnight.  Patient now on Dilantin and Keppra both. Follow-up head CT scan demonstrates almost complete resolution of large subdural fluid collection.  Mass-effect is all.  No evidence of obvious underlying infarct or other significant structural lesion. Postop day 2.  Overall patient looks much better and he is ready to be discharged to home.     Consults: None  Significant Diagnostic Studies: radiology: CT scan: CT Head  Treatments: surgery: Left frontal parietal craniotomy and evacuation of subdural hematoma  Discharge Exam: Blood pressure 135/67, pulse 62, temperature 97.9 F (36.6 C), temperature source Oral, resp. rate 15, height 5\' 11"  (1.803 m), weight 104.1 kg, SpO2 92 %.  The patient is alert and oriented to person, place, and time; but not situation. However, he did state he had brain surgery but is amnestic to the surgery. PERRLA. CN II-XII grossly intact. MAEW, Strength and sensation intact, gait slightly unsteady Incision is closed with staples and is open to air with no drainage, erythema or edema.   Disposition: Discharge disposition: 01-Home or Self Care             Signed: Peggyann Shoals 02/15/2020, 9:04 AM

## 2020-02-15 NOTE — Progress Notes (Signed)
Pt d/c to home by car with family. Assessment stable. D/C instructions reviewed and all questions answered. 

## 2020-02-15 NOTE — Plan of Care (Signed)
All goals met. Pending discharge today.

## 2020-02-23 ENCOUNTER — Other Ambulatory Visit: Payer: Self-pay | Admitting: Physician Assistant

## 2020-02-24 DIAGNOSIS — D2239 Melanocytic nevi of other parts of face: Secondary | ICD-10-CM | POA: Diagnosis not present

## 2020-02-24 DIAGNOSIS — C44329 Squamous cell carcinoma of skin of other parts of face: Secondary | ICD-10-CM | POA: Diagnosis not present

## 2020-02-24 DIAGNOSIS — L570 Actinic keratosis: Secondary | ICD-10-CM | POA: Diagnosis not present

## 2020-02-26 DIAGNOSIS — S065X9A Traumatic subdural hemorrhage with loss of consciousness of unspecified duration, initial encounter: Secondary | ICD-10-CM

## 2020-02-26 DIAGNOSIS — S065XAA Traumatic subdural hemorrhage with loss of consciousness status unknown, initial encounter: Secondary | ICD-10-CM

## 2020-02-26 HISTORY — DX: Traumatic subdural hemorrhage with loss of consciousness status unknown, initial encounter: S06.5XAA

## 2020-02-26 HISTORY — DX: Traumatic subdural hemorrhage with loss of consciousness of unspecified duration, initial encounter: S06.5X9A

## 2020-02-27 DIAGNOSIS — E291 Testicular hypofunction: Secondary | ICD-10-CM | POA: Diagnosis not present

## 2020-03-03 DIAGNOSIS — E291 Testicular hypofunction: Secondary | ICD-10-CM | POA: Diagnosis not present

## 2020-03-15 DIAGNOSIS — R69 Illness, unspecified: Secondary | ICD-10-CM | POA: Diagnosis not present

## 2020-03-15 DIAGNOSIS — E291 Testicular hypofunction: Secondary | ICD-10-CM | POA: Diagnosis not present

## 2020-03-15 DIAGNOSIS — N529 Male erectile dysfunction, unspecified: Secondary | ICD-10-CM | POA: Diagnosis not present

## 2020-03-15 DIAGNOSIS — G479 Sleep disorder, unspecified: Secondary | ICD-10-CM | POA: Diagnosis not present

## 2020-03-19 DIAGNOSIS — E291 Testicular hypofunction: Secondary | ICD-10-CM | POA: Diagnosis not present

## 2020-03-26 DIAGNOSIS — E291 Testicular hypofunction: Secondary | ICD-10-CM | POA: Diagnosis not present

## 2020-04-07 ENCOUNTER — Encounter: Payer: Self-pay | Admitting: Family Medicine

## 2020-04-07 NOTE — Progress Notes (Signed)
Subjective:    Patient ID: Christian Deem., male    DOB: Oct 10, 1940, 79 y.o.   MRN: [id number]  Chief Complaint  Patient presents with  . Hyperlipidemia  . Gastroesophageal Reflux  . COPD  . Prediabetes    HPI Patient is in today for Pt presents with hyperlipidemia.  Date of diagnosis 2003.  Previous treatment includes Crestor, Lopid and Fish Oil.  Stopped due to making him feel bad. Poor compliance with treatment has been good; he takes his medication as directed, does not maintain his low cholesterol diet, and follows up as directed.  He denies experiencing any hypercholesterolemia related symptoms.      Christian Lowe presents with a diagnosis of gastro-esophageal reflux disease without esophagitis.  This was diagnosed 2008.  The course has been stable and nonprogressive.  Symptoms are well controlled with Omeprazole.    Christian Lowe presents with a diagnosis of COPD.  The course has been stable and nonprogressive.  Not taking inhaler. Breathing is fine.   Christian Lowe presents with a diagnosis of other testicular dysfunction.  The course has been improving.  Taking hormone therapy from [facility]. Getting Testosterone shots and B12 shots. Erectile dysfunction has improved. He thinks he is taking some other shot.     Christian Lowe presents with a diagnosis of impaired fasting glucose.  Dxd in the fall.  Past Medical History:  Diagnosis Date  . BPPV (benign paroxysmal positional vertigo)   . Calculus of kidney   . Calculus of kidney   . Cancer ([place]) 2015   skin cancer removed off left forearm   . GERD (gastroesophageal reflux disease)   . History of basal cell carcinoma (BCC)   . Mixed hyperlipidemia   . Osteoarthritis   . Prediabetes   . Vitamin D deficiency     Past Surgical History:  Procedure Laterality Date  . CRANIOTOMY Left 02/10/2020   Procedure: CRANIOTOMY HEMATOMA EVACUATION SUBDURAL;  Surgeon: Earnie Larsson, MD;  Location: [place];  Service: Neurosurgery;  Laterality: Left;  . EYE  SURGERY Bilateral 2012   cataracts w lens implants  . PROSTATECTOMY  2008   BPH  . 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 TEMPORAL ARTERY BIOPSY / LIGATION Bilateral 1997   negative  . TRANSURETHRAL RESECTION OF PROSTATE  2010    History reviewed. No pertinent family history.  Social History   Socioeconomic History  . Marital status: Widowed    Spouse name: Not on file  . Number of children: Not on file  . Years of education: Not on file  . Highest education level: Not on file  Occupational History  . Not on file  Tobacco Use  . Smoking status: Former Smoker    Packs/day: 1.00    Years: 20.00    Pack years: 20.00    Types: Cigarettes    Quit date: 01/06/1967    Years since quitting: 53.3  . Smokeless tobacco: Never Used  Substance and Sexual Activity  . Alcohol use: Yes    Comment: occassionally  . Drug use: No  . Sexual activity: Not on file  Other Topics Concern  . Not on file  Social History Narrative  . Not on file   Social Determinants of Health   Financial Resource Strain:   . Difficulty of Paying Living Expenses: Not on file  Food Insecurity:   . Worried About Charity fundraiser in the Last Year: Not on file  . Ran Out of Food in the Last Year:  Not on file  Transportation Needs:   . Lack of Transportation (Medical): Not on file  . Lack of Transportation (Non-Medical): Not on file  Physical Activity:   . Days of Exercise per Week: Not on file  . Minutes of Exercise per Session: Not on file  Stress:   . Feeling of Stress : Not on file  Social Connections:   . Frequency of Communication with Friends and Family: Not on file  . Frequency of Social Gatherings with Friends and Family: Not on file  . Attends Religious Services: Not on file  . Active Member of Clubs or Organizations: Not on file  . Attends Archivist Meetings: Not on file  . Marital Status: Not on file  Intimate Partner Violence:   . Fear of Current or Ex-Partner: Not on file    . Emotionally Abused: Not on file  . Physically Abused: Not on file  . Sexually Abused: Not on file    Outpatient Medications Prior to Visit  Medication Sig Dispense Refill  . levETIRAcetam (KEPPRA) 500 MG tablet Take 1 tablet (500 mg total) by mouth 2 (two) times daily. 60 tablet 0  . naproxen (NAPROSYN) 500 MG tablet TAKE 1 TABLET (500 MG TOTAL) BY MOUTH 2 (TWO) TIMES DAILY WITH A MEAL. AS NEEDED FOR PAIN. (Patient taking differently: Take 500 mg by mouth 2 (two) times daily as needed for mild pain. ) 60 tablet 0  . omeprazole (PRILOSEC) 20 MG capsule Take 20 mg by mouth daily before breakfast.     . tadalafil (CIALIS) 5 MG tablet TAKE 1 TABLET BY MOUTH AS NEEDED 30 60 MINUTES PRIOR TO SEXUAL ACTIVITY    . Testosterone Cypionate 200 MG/ML KIT Inject 200 mg into the muscle once a week. 1 ml every 2 weeks      No facility-administered medications prior to visit.    Allergies  Allergen Reactions  . Statins     myalgias    Review of Systems  Constitutional: Negative for chills, diaphoresis, fatigue and fever.  HENT: Positive for sore throat ("scratchy"). Negative for congestion and ear pain.   Respiratory: Positive for cough. Negative for shortness of breath.   Cardiovascular: Negative for chest pain and leg swelling.  Gastrointestinal: Negative for abdominal pain, constipation, diarrhea, nausea and vomiting.  Neurological: Negative for dizziness and headaches.       Objective:    Physical Exam Vitals reviewed.  Constitutional:      Appearance: Normal appearance. He is obese.  HENT:     Mouth/Throat:     Mouth: Mucous membranes are moist.     Pharynx: No oropharyngeal exudate or posterior oropharyngeal erythema.  Neck:     Vascular: No carotid bruit.  Cardiovascular:     Rate and Rhythm: Normal rate and regular rhythm.     Pulses: Normal pulses.     Heart sounds: Normal heart sounds.  Pulmonary:     Effort: Pulmonary effort is normal. No respiratory distress.      Breath sounds: Normal breath sounds. No wheezing, rhonchi or rales.  Abdominal:     General: Bowel sounds are normal.     Palpations: Abdomen is soft.     Tenderness: There is no abdominal tenderness.  Lymphadenopathy:     Cervical: No cervical adenopathy.  Neurological:     Mental Status: He is alert.  Psychiatric:        Mood and Affect: Mood normal.        Behavior: Behavior normal.  BP (!) 152/80 (BP Location: Left Arm, Patient Position: Sitting, Cuff Size: Large)   Pulse 83   Temp 98 F (36.7 C) (Temporal)   Resp 16   Wt 230 lb (104.3 kg)   SpO2 96%   BMI 32.08 kg/m  Wt Readings from Last 3 Encounters:  04/08/20 230 lb (104.3 kg)  02/12/20 229 lb 8 oz (104.1 kg)  12/30/19 229 lb 9.6 oz (104.1 kg)    Health Maintenance Due  Topic Date Due  . COVID-19 Vaccine (1) Never done    There are no preventive care reminders to display for this patient.   No results found for: TSH Lab Results  Component Value Date   WBC 5.3 04/08/2020   HGB 15.5 04/08/2020   HCT 49.1 04/08/2020   MCV 84 04/08/2020   PLT 235 04/08/2020   Lab Results  Component Value Date   NA 140 04/08/2020   K 4.3 04/08/2020   CO2 23 04/08/2020   GLUCOSE 118 (H) 04/08/2020   BUN 14 04/08/2020   CREATININE 0.95 04/08/2020   BILITOT 0.4 04/08/2020   ALKPHOS 60 04/08/2020   AST 14 04/08/2020   ALT 10 04/08/2020   PROT 6.7 04/08/2020   ALBUMIN 4.1 04/08/2020   CALCIUM 8.9 04/08/2020   ANIONGAP 11 02/11/2020   Lab Results  Component Value Date   CHOL 240 (H) 04/08/2020   Lab Results  Component Value Date   HDL 33 (L) 04/08/2020   Lab Results  Component Value Date   LDLCALC 166 (H) 04/08/2020   Lab Results  Component Value Date   TRIG 219 (H) 04/08/2020   Lab Results  Component Value Date   CHOLHDL 7.3 (H) 04/08/2020   Lab Results  Component Value Date   HGBA1C 5.6 04/08/2020       Assessment & Plan:  1. Essential hypertension Elevated. Check bp at home. If still up  above 140 and above 90. Recommend continue to work on eating healthy diet and exercise. - CBC with Differential/Platelet - Comprehensive metabolic panel  2. Mixed hyperlipidemia Poorly controlled.  Recommend restart crestor 40 mg once daily at night.  Continue to work on eating a healthy diet and exercise.  Labs drawn today.  - Lipid panel  3. Prediabetes - Hemoglobin A1c Recommend continue to work on eating healthy diet and exercise.  4. Need for vaccination - Flu Vaccine QUAD High Dose(Fluad)  5. 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   6. Need for immunization against influenza - Flu Vaccine QUAD High Dose(Fluad)   Refused covid 19 vaccination. Long discussion concerning covid 19 vaccination.   Orders Placed This Encounter  Procedures  . Flu Vaccine QUAD High Dose(Fluad)  . Flu Vaccine QUAD High Dose(Fluad)  . Hemoglobin A1c  . CBC with Differential/Platelet  . Comprehensive metabolic panel  . Lipid panel  . Cardiovascular Risk Assessment     Follow-up: Return in about 3 months (around 07/08/2020) for fasting.  An After Visit Summary was printed and given to the patient.  Rochel Brome  Family Practice [phone]

## 2020-04-08 ENCOUNTER — Other Ambulatory Visit: Payer: Self-pay

## 2020-04-08 ENCOUNTER — Telehealth (INDEPENDENT_AMBULATORY_CARE_PROVIDER_SITE_OTHER): Payer: Medicare HMO | Admitting: Family Medicine

## 2020-04-08 VITALS — BP 152/80 | HR 83 | Temp 98.0°F | Resp 16 | Wt 230.0 lb

## 2020-04-08 DIAGNOSIS — S065X9A Traumatic subdural hemorrhage with loss of consciousness of unspecified duration, initial encounter: Secondary | ICD-10-CM

## 2020-04-08 DIAGNOSIS — E782 Mixed hyperlipidemia: Secondary | ICD-10-CM

## 2020-04-08 DIAGNOSIS — I10 Essential (primary) hypertension: Secondary | ICD-10-CM

## 2020-04-08 DIAGNOSIS — Z23 Encounter for immunization: Secondary | ICD-10-CM | POA: Diagnosis not present

## 2020-04-08 DIAGNOSIS — R7303 Prediabetes: Secondary | ICD-10-CM

## 2020-04-08 DIAGNOSIS — S065XAA Traumatic subdural hemorrhage with loss of consciousness status unknown, initial encounter: Secondary | ICD-10-CM

## 2020-04-09 DIAGNOSIS — E291 Testicular hypofunction: Secondary | ICD-10-CM | POA: Diagnosis not present

## 2020-04-09 LAB — HEMOGLOBIN A1C
Est. average glucose Bld gHb Est-mCnc: 114 mg/dL
Hgb A1c MFr Bld: 5.6 % (ref 4.8–5.6)

## 2020-04-09 LAB — COMPREHENSIVE METABOLIC PANEL
ALT: 10 IU/L (ref 0–44)
AST: 14 IU/L (ref 0–40)
Albumin/Globulin Ratio: 1.6 (ref 1.2–2.2)
Albumin: 4.1 g/dL (ref 3.7–4.7)
Alkaline Phosphatase: 60 IU/L (ref 48–121)
BUN/Creatinine Ratio: 15 (ref 10–24)
BUN: 14 mg/dL (ref 8–27)
Bilirubin Total: 0.4 mg/dL (ref 0.0–1.2)
CO2: 23 mmol/L (ref 20–29)
Calcium: 8.9 mg/dL (ref 8.6–10.2)
Chloride: 104 mmol/L (ref 96–106)
Creatinine, Ser: 0.95 mg/dL (ref 0.76–1.27)
GFR calc Af Amer: 87 mL/min/{1.73_m2} (ref >59)
GFR calc non Af Amer: 75 mL/min/{1.73_m2} (ref >59)
Globulin, Total: 2.6 g/dL (ref 1.5–4.5)
Glucose: 118 mg/dL — ABNORMAL HIGH (ref 65–99)
Potassium: 4.3 mmol/L (ref 3.5–5.2)
Sodium: 140 mmol/L (ref 134–144)
Total Protein: 6.7 g/dL (ref 6.0–8.5)

## 2020-04-09 LAB — CBC WITH DIFFERENTIAL/PLATELET
Basophils Absolute: 0.1 10*3/uL (ref 0.0–0.2)
Basos: 1 %
EOS (ABSOLUTE): 0.2 10*3/uL (ref 0.0–0.4)
Eos: 4 %
Hematocrit: 49.1 % (ref 37.5–51.0)
Hemoglobin: 15.5 g/dL (ref 13.0–17.7)
Immature Grans (Abs): 0 10*3/uL (ref 0.0–0.1)
Immature Granulocytes: 0 %
Lymphocytes Absolute: 1.1 10*3/uL (ref 0.7–3.1)
Lymphs: 20 %
MCH: 26.5 pg — ABNORMAL LOW (ref 26.6–33.0)
MCHC: 31.6 g/dL (ref 31.5–35.7)
MCV: 84 fL (ref 79–97)
Monocytes Absolute: 0.7 10*3/uL (ref 0.1–0.9)
Monocytes: 13 %
Neutrophils Absolute: 3.2 10*3/uL (ref 1.4–7.0)
Neutrophils: 62 %
Platelets: 235 10*3/uL (ref 150–450)
RBC: 5.85 x10E6/uL — ABNORMAL HIGH (ref 4.14–5.80)
RDW: 13.9 % (ref 11.6–15.4)
WBC: 5.3 10*3/uL (ref 3.4–10.8)

## 2020-04-09 LAB — LIPID PANEL
Chol/HDL Ratio: 7.3 ratio — ABNORMAL HIGH (ref 0.0–5.0)
Cholesterol, Total: 240 mg/dL — ABNORMAL HIGH (ref 100–199)
HDL: 33 mg/dL — ABNORMAL LOW (ref >39)
LDL Chol Calc (NIH): 166 mg/dL — ABNORMAL HIGH (ref 0–99)
Triglycerides: 219 mg/dL — ABNORMAL HIGH (ref 0–149)
VLDL Cholesterol Cal: 41 mg/dL — ABNORMAL HIGH (ref 5–40)

## 2020-04-09 LAB — CARDIOVASCULAR RISK ASSESSMENT

## 2020-04-13 ENCOUNTER — Other Ambulatory Visit: Payer: Self-pay

## 2020-04-14 ENCOUNTER — Encounter: Payer: Self-pay | Admitting: Family Medicine

## 2020-04-15 ENCOUNTER — Telehealth: Payer: Self-pay

## 2020-04-15 NOTE — Telephone Encounter (Signed)
-----   Message from Rochel Brome, MD sent at 04/14/2020 10:26 PM EDT ----- Regarding: BP check Please call him and ask if he can check bp at home. His bp was too high in the office. If not, please ask him to stop by to have a nurse visit to check bp. Goal <140 and <90. kc

## 2020-04-15 NOTE — Telephone Encounter (Signed)
Christian Lowe was called concerning his bp.  He does not have a way of doing this at home.  He was instructed to come in for a nurse visit to recheck his bp.

## 2020-04-16 DIAGNOSIS — E291 Testicular hypofunction: Secondary | ICD-10-CM | POA: Diagnosis not present

## 2020-04-23 DIAGNOSIS — E291 Testicular hypofunction: Secondary | ICD-10-CM | POA: Diagnosis not present

## 2020-05-03 ENCOUNTER — Other Ambulatory Visit: Payer: Self-pay | Admitting: Family Medicine

## 2020-05-03 DIAGNOSIS — L578 Other skin changes due to chronic exposure to nonionizing radiation: Secondary | ICD-10-CM | POA: Diagnosis not present

## 2020-05-03 DIAGNOSIS — C44329 Squamous cell carcinoma of skin of other parts of face: Secondary | ICD-10-CM | POA: Diagnosis not present

## 2020-05-03 DIAGNOSIS — I700 Atherosclerosis of aorta: Secondary | ICD-10-CM

## 2020-05-03 DIAGNOSIS — L821 Other seborrheic keratosis: Secondary | ICD-10-CM | POA: Diagnosis not present

## 2020-05-03 DIAGNOSIS — E782 Mixed hyperlipidemia: Secondary | ICD-10-CM

## 2020-05-03 DIAGNOSIS — L570 Actinic keratosis: Secondary | ICD-10-CM | POA: Diagnosis not present

## 2020-05-03 MED ORDER — NEXLETOL 180 MG PO TABS: 1.0000 | ORAL_TABLET | Freq: Every day | ORAL | 2 refills | Status: DC

## 2020-05-03 NOTE — Progress Notes (Signed)
For PA of Nexletol: 1. Atherosclerosis of aorta ([place]) 2. Mixed hyperlipidemia Patient is intolerant to statins. Failed on lipitor and crestor. Severe muscle pain.  Ordering nexletol 180 mg once daily.  Samples to be given.

## 2020-05-04 ENCOUNTER — Ambulatory Visit: Payer: Medicare HMO

## 2020-05-04 ENCOUNTER — Telehealth: Payer: Self-pay

## 2020-05-04 NOTE — Telephone Encounter (Signed)
PA for Nexletol submitted and denied via covermymeds. Patient must try and fail Praluent.

## 2020-05-07 DIAGNOSIS — E291 Testicular hypofunction: Secondary | ICD-10-CM | POA: Diagnosis not present

## 2020-05-10 ENCOUNTER — Other Ambulatory Visit: Payer: Self-pay | Admitting: Family Medicine

## 2020-05-10 NOTE — Telephone Encounter (Signed)
Patient states that he is willing to try Praluent

## 2020-05-10 NOTE — Telephone Encounter (Signed)
Please ask if pt is willing to try praluent (injectable every 2 weeks.Marland Kitchen)

## 2020-05-11 ENCOUNTER — Other Ambulatory Visit: Payer: Self-pay | Admitting: Family Medicine

## 2020-05-11 MED ORDER — PRALUENT 75 MG/ML ~~LOC~~ SOAJ: 1.0000 | SUBCUTANEOUS | 2 refills | Status: DC

## 2020-05-13 ENCOUNTER — Telehealth: Payer: Self-pay

## 2020-05-13 NOTE — Telephone Encounter (Signed)
PA for Praluent submitted and approved via [email or web address].

## 2020-05-14 DIAGNOSIS — E291 Testicular hypofunction: Secondary | ICD-10-CM | POA: Diagnosis not present

## 2020-05-21 DIAGNOSIS — E291 Testicular hypofunction: Secondary | ICD-10-CM | POA: Diagnosis not present

## 2020-05-27 DIAGNOSIS — E291 Testicular hypofunction: Secondary | ICD-10-CM | POA: Diagnosis not present

## 2020-06-03 DIAGNOSIS — E291 Testicular hypofunction: Secondary | ICD-10-CM | POA: Diagnosis not present

## 2020-06-09 DIAGNOSIS — E291 Testicular hypofunction: Secondary | ICD-10-CM | POA: Diagnosis not present

## 2020-06-16 DIAGNOSIS — E291 Testicular hypofunction: Secondary | ICD-10-CM | POA: Diagnosis not present

## 2020-06-23 DIAGNOSIS — E291 Testicular hypofunction: Secondary | ICD-10-CM | POA: Diagnosis not present

## 2020-06-30 DIAGNOSIS — E291 Testicular hypofunction: Secondary | ICD-10-CM | POA: Diagnosis not present

## 2020-07-07 DIAGNOSIS — R5383 Other fatigue: Secondary | ICD-10-CM | POA: Diagnosis not present

## 2020-07-07 DIAGNOSIS — Z79890 Hormone replacement therapy (postmenopausal): Secondary | ICD-10-CM | POA: Diagnosis not present

## 2020-07-07 DIAGNOSIS — E291 Testicular hypofunction: Secondary | ICD-10-CM | POA: Diagnosis not present

## 2020-07-08 DIAGNOSIS — N529 Male erectile dysfunction, unspecified: Secondary | ICD-10-CM | POA: Diagnosis not present

## 2020-07-08 DIAGNOSIS — Z6833 Body mass index (BMI) 33.0-33.9, adult: Secondary | ICD-10-CM | POA: Diagnosis not present

## 2020-07-08 DIAGNOSIS — Z809 Family history of malignant neoplasm, unspecified: Secondary | ICD-10-CM | POA: Diagnosis not present

## 2020-07-08 DIAGNOSIS — N3941 Urge incontinence: Secondary | ICD-10-CM | POA: Diagnosis not present

## 2020-07-08 DIAGNOSIS — E669 Obesity, unspecified: Secondary | ICD-10-CM | POA: Diagnosis not present

## 2020-07-08 DIAGNOSIS — Z7722 Contact with and (suspected) exposure to environmental tobacco smoke (acute) (chronic): Secondary | ICD-10-CM | POA: Diagnosis not present

## 2020-07-08 DIAGNOSIS — N3281 Overactive bladder: Secondary | ICD-10-CM | POA: Diagnosis not present

## 2020-07-08 DIAGNOSIS — Z825 Family history of asthma and other chronic lower respiratory diseases: Secondary | ICD-10-CM | POA: Diagnosis not present

## 2020-07-08 DIAGNOSIS — R030 Elevated blood-pressure reading, without diagnosis of hypertension: Secondary | ICD-10-CM | POA: Diagnosis not present

## 2020-07-08 DIAGNOSIS — K219 Gastro-esophageal reflux disease without esophagitis: Secondary | ICD-10-CM | POA: Diagnosis not present

## 2020-07-12 DIAGNOSIS — R5383 Other fatigue: Secondary | ICD-10-CM | POA: Diagnosis not present

## 2020-07-12 DIAGNOSIS — R69 Illness, unspecified: Secondary | ICD-10-CM | POA: Diagnosis not present

## 2020-07-12 DIAGNOSIS — E291 Testicular hypofunction: Secondary | ICD-10-CM | POA: Diagnosis not present

## 2020-07-21 DIAGNOSIS — E291 Testicular hypofunction: Secondary | ICD-10-CM | POA: Diagnosis not present

## 2020-07-27 ENCOUNTER — Encounter: Payer: Self-pay | Admitting: Family Medicine

## 2020-07-27 ENCOUNTER — Ambulatory Visit (INDEPENDENT_AMBULATORY_CARE_PROVIDER_SITE_OTHER): Payer: Medicare HMO | Admitting: Family Medicine

## 2020-07-27 ENCOUNTER — Other Ambulatory Visit: Payer: Self-pay | Admitting: Family Medicine

## 2020-07-27 ENCOUNTER — Other Ambulatory Visit: Payer: Self-pay

## 2020-07-27 VITALS — BP 128/80 | HR 95 | Temp 97.5°F | Ht 71.0 in | Wt 241.8 lb

## 2020-07-27 DIAGNOSIS — I700 Atherosclerosis of aorta: Secondary | ICD-10-CM | POA: Diagnosis not present

## 2020-07-27 DIAGNOSIS — R7303 Prediabetes: Secondary | ICD-10-CM | POA: Diagnosis not present

## 2020-07-27 DIAGNOSIS — M65352 Trigger finger, left little finger: Secondary | ICD-10-CM

## 2020-07-27 DIAGNOSIS — E782 Mixed hyperlipidemia: Secondary | ICD-10-CM

## 2020-07-27 DIAGNOSIS — E538 Deficiency of other specified B group vitamins: Secondary | ICD-10-CM | POA: Diagnosis not present

## 2020-07-27 DIAGNOSIS — R002 Palpitations: Secondary | ICD-10-CM

## 2020-07-27 DIAGNOSIS — N3941 Urge incontinence: Secondary | ICD-10-CM

## 2020-07-27 DIAGNOSIS — I10 Essential (primary) hypertension: Secondary | ICD-10-CM | POA: Diagnosis not present

## 2020-07-27 MED ORDER — TRIAMCINOLONE ACETONIDE 40 MG/ML IJ SUSP: 20.0000 mg | Freq: Once | INTRAMUSCULAR | Status: DC

## 2020-07-27 MED ORDER — TOVIAZ 8 MG PO TB24: 8.0000 mg | ORAL_TABLET | Freq: Every day | ORAL | 2 refills | Status: DC

## 2020-07-27 NOTE — Progress Notes (Signed)
Subjective:  Patient ID: Christian Deem., male    DOB: 04-15-1941  Age: 79 y.o. MRN: [id number]  Chief Complaint  Patient presents with  . Hyperlipidemia    HPI Hypogonadism: taking testosterone shots, Anastrozole 1 mg once daily, and B12 shots. Patient has been going to [facility] to get these treatments. He has lost approximately 75 lbs over the last 3 yrs and gained some back. . Per patient he tried to donate blood and his blood was too thick and his heart rate was elevated.  He was told to come to his PCP. Hyperlipidemia: on Praluent 75 mg/ml injection 2 weeks. Not taking crestor due to muscle pain. Pt is eating fairly healthy.  Complaining of urge incontinence/leakage. Tolteridone not helping. Requesting change to medicines.    Current Outpatient Medications on File Prior to Visit  Medication Sig Dispense Refill  . Alirocumab (PRALUENT) 75 MG/ML SOAJ Inject 1 each into the skin every 14 (fourteen) days. 2 mL 2  . anastrozole (ARIMIDEX) 1 MG tablet Take by mouth.    . naproxen (NAPROSYN) 500 MG tablet TAKE 1 TABLET (500 MG TOTAL) BY MOUTH 2 (TWO) TIMES DAILY WITH A MEAL. AS NEEDED FOR PAIN. (Patient taking differently: Take 500 mg by mouth 2 (two) times daily as needed for mild pain.) 60 tablet 0  . tadalafil (CIALIS) 5 MG tablet TAKE 1 TABLET BY MOUTH AS NEEDED 30 60 MINUTES PRIOR TO SEXUAL ACTIVITY    . Testosterone Cypionate 200 MG/ML KIT Inject 200 mg into the muscle once a week. 1 ml every 2 weeks    . triamcinolone (KENALOG) 0.1 % Apply topically 2 (two) times daily.     No current facility-administered medications on file prior to visit.   Past Medical History:  Diagnosis Date  . BPPV (benign paroxysmal positional vertigo)   . Calculus of kidney   . Calculus of kidney   . Cancer ([place]) 2015   skin cancer removed off left forearm   . GERD (gastroesophageal reflux disease)   . History of basal cell carcinoma (BCC)   . Mixed hyperlipidemia   . Osteoarthritis    . Prediabetes   . Vitamin D deficiency    Past Surgical History:  Procedure Laterality Date  . CRANIOTOMY Left 02/10/2020   Procedure: CRANIOTOMY HEMATOMA EVACUATION SUBDURAL;  Surgeon: Earnie Larsson, MD;  Location: [place];  Service: Neurosurgery;  Laterality: Left;  . EYE SURGERY Bilateral 2012   cataracts w lens implants  . PROSTATECTOMY  2008   BPH  . 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 TEMPORAL ARTERY BIOPSY / LIGATION Bilateral 1997   negative  . TRANSURETHRAL RESECTION OF PROSTATE  2010    History reviewed. No pertinent family history. Social History   Socioeconomic History  . Marital status: Widowed    Spouse name: Not on file  . Number of children: Not on file  . Years of education: Not on file  . Highest education level: Not on file  Occupational History  . Not on file  Tobacco Use  . Smoking status: Former Smoker    Packs/day: 1.00    Years: 20.00    Pack years: 20.00    Types: Cigarettes    Quit date: 01/06/1967    Years since quitting: 53.5  . Smokeless tobacco: Never Used  Substance and Sexual Activity  . Alcohol use: Yes    Comment: occassionally  . Drug use: No  . Sexual activity: Not on file  Other Topics  Concern  . Not on file  Social History Narrative  . Not on file   Social Determinants of Health   Financial Resource Strain: Not on file  Food Insecurity: Not on file  Transportation Needs: Not on file  Physical Activity: Not on file  Stress: Not on file  Social Connections: Not on file    Review of Systems  Constitutional: Negative for fatigue.  HENT: Positive for congestion. Negative for ear pain and sore throat.   Respiratory: Negative for cough and shortness of breath.   Cardiovascular: Negative for chest pain.  Gastrointestinal: Negative for abdominal pain, constipation, diarrhea, nausea and vomiting.  Genitourinary: Negative for dysuria, frequency and urgency.  Musculoskeletal: Positive for arthralgias (fifth digit left hand  triggering. ), back pain and myalgias.  Neurological: Negative for dizziness and headaches.  Psychiatric/Behavioral: Negative for agitation and sleep disturbance. The patient is not nervous/anxious.      Objective:  BP 128/80 (BP Location: Left Arm, Patient Position: Sitting, Cuff Size: Large)   Pulse 95   Temp (!) 97.5 F (36.4 C) (Temporal)   Ht 5' 11"  (1.803 m)   Wt 241 lb 12.8 oz (109.7 kg)   SpO2 99%   BMI 33.72 kg/m   BP/Weight 07/27/2020 04/08/2020 5/99/3570  Systolic BP 177 939 030  Diastolic BP 80 80 83  Wt. (Lbs) 241.8 230 -  BMI 33.72 32.08 -    Physical Exam Vitals reviewed.  Constitutional:      Appearance: Normal appearance.  Neck:     Vascular: No carotid bruit.  Cardiovascular:     Rate and Rhythm: Normal rate and regular rhythm.     Pulses: Normal pulses.     Heart sounds: Normal heart sounds.  Pulmonary:     Effort: Pulmonary effort is normal.     Breath sounds: Normal breath sounds. No wheezing, rhonchi or rales.  Abdominal:     General: Bowel sounds are normal.     Palpations: Abdomen is soft.     Tenderness: There is no abdominal tenderness.  Musculoskeletal:        General: Tenderness (fifth digit of left hand triggering. ) present.  Neurological:     Mental Status: He is alert.  Psychiatric:        Mood and Affect: Mood normal.        Behavior: Behavior normal.     Diabetic Foot Exam - Simple   No data filed      Lab Results  Component Value Date   WBC 5.3 04/08/2020   HGB 15.5 04/08/2020   HCT 49.1 04/08/2020   PLT 235 04/08/2020   GLUCOSE 118 (H) 04/08/2020   CHOL 240 (H) 04/08/2020   TRIG 219 (H) 04/08/2020   HDL 33 (L) 04/08/2020   LDLCALC 166 (H) 04/08/2020   ALT 10 04/08/2020   AST 14 04/08/2020   NA 140 04/08/2020   K 4.3 04/08/2020   CL 104 04/08/2020   CREATININE 0.95 04/08/2020   BUN 14 04/08/2020   CO2 23 04/08/2020   INR 1.1 02/10/2020   HGBA1C 5.6 04/08/2020      Assessment & Plan:   1.  Palpitations - TSH, CBC, CMP - EKG 12-Lead NSR. No st changes.  2. Mixed hyperlipidemia Check lipid panel.  Assess how well praluent is working.. Continue to work on eating a healthy diet and exercise.   3. Atherosclerosis of aorta (HCC) - Lipid panel  4. Prediabetes - Hemoglobin A1c  5. B12 deficiency due to diet -  Vitamin B12  6. Trigger little finger of left hand - triamcinolone acetonide (KENALOG-40) injection 20 mg Risks were discussed including bleeding, infection, increase in sugars if diabetic, atrophy at site of injection, and increased pain.  Kenalog 20 mg and 0.5 ml plain Lidocaine was then injected and the needle withdrawn.  The procedure was well tolerated.   The patient is asked to continue to rest the joint for a few more days before resuming regular activities.  It may be more painful for the first 1-2 days.  Watch for fever, or increased swelling or persistent pain in the joint. Call or return to clinic prn if such symptoms occur or there is failure to improve as anticipated.  7. Urge incontinence  Start on toviaz 8 mg once daily. Samples given. Rx sent. Stop tolteridone.  Meds ordered this encounter  Medications  . fesoterodine (TOVIAZ) 8 MG TB24 tablet    Sig: Take 1 tablet (8 mg total) by mouth daily.    Dispense:  30 tablet    Refill:  2  . triamcinolone acetonide (KENALOG-40) injection 20 mg    Orders Placed This Encounter  Procedures  . CBC with Differential/Platelet  . Comprehensive metabolic panel  . Hemoglobin A1c  . Lipid panel  . TSH  . Vitamin B12  . EKG 12-Lead    Follow-up: Return for pfizer vaccine. .  An After Visit Summary was printed and given to the patient.  Rochel Brome, MD [facility] [phone]

## 2020-07-27 NOTE — Patient Instructions (Signed)
Toviaz 8 mg once daily. Stop tolteridone.  Sent rx to [facility]. Wait to pick up until knows it helps.

## 2020-07-28 ENCOUNTER — Ambulatory Visit (INDEPENDENT_AMBULATORY_CARE_PROVIDER_SITE_OTHER): Payer: Medicare HMO

## 2020-07-28 DIAGNOSIS — Z23 Encounter for immunization: Secondary | ICD-10-CM | POA: Diagnosis not present

## 2020-07-28 LAB — CARDIOVASCULAR RISK ASSESSMENT

## 2020-07-28 LAB — COMPREHENSIVE METABOLIC PANEL
ALT: 7 IU/L (ref 0–44)
AST: 7 IU/L (ref 0–40)
Albumin/Globulin Ratio: 1.5 (ref 1.2–2.2)
Albumin: 4.1 g/dL (ref 3.7–4.7)
Alkaline Phosphatase: 57 IU/L (ref 44–121)
BUN/Creatinine Ratio: 16 (ref 10–24)
BUN: 16 mg/dL (ref 8–27)
Bilirubin Total: 0.7 mg/dL (ref 0.0–1.2)
CO2: 25 mmol/L (ref 20–29)
Calcium: 9.3 mg/dL (ref 8.6–10.2)
Chloride: 103 mmol/L (ref 96–106)
Creatinine, Ser: 1.03 mg/dL (ref 0.76–1.27)
GFR calc Af Amer: 79 mL/min/{1.73_m2} (ref >59)
GFR calc non Af Amer: 68 mL/min/{1.73_m2} (ref >59)
Globulin, Total: 2.7 g/dL (ref 1.5–4.5)
Glucose: 118 mg/dL — ABNORMAL HIGH (ref 65–99)
Potassium: 4.5 mmol/L (ref 3.5–5.2)
Sodium: 141 mmol/L (ref 134–144)
Total Protein: 6.8 g/dL (ref 6.0–8.5)

## 2020-07-28 LAB — CBC WITH DIFFERENTIAL/PLATELET
Basophils Absolute: 0 10*3/uL (ref 0.0–0.2)
Basos: 1 %
EOS (ABSOLUTE): 0.2 10*3/uL (ref 0.0–0.4)
Eos: 4 %
Hematocrit: 48.2 % (ref 37.5–51.0)
Hemoglobin: 16.2 g/dL (ref 13.0–17.7)
Immature Grans (Abs): 0 10*3/uL (ref 0.0–0.1)
Immature Granulocytes: 0 %
Lymphocytes Absolute: 1.2 10*3/uL (ref 0.7–3.1)
Lymphs: 18 %
MCH: 27.8 pg (ref 26.6–33.0)
MCHC: 33.6 g/dL (ref 31.5–35.7)
MCV: 83 fL (ref 79–97)
Monocytes Absolute: 0.7 10*3/uL (ref 0.1–0.9)
Monocytes: 12 %
Neutrophils Absolute: 4.2 10*3/uL (ref 1.4–7.0)
Neutrophils: 65 %
Platelets: 218 10*3/uL (ref 150–450)
RBC: 5.83 x10E6/uL — ABNORMAL HIGH (ref 4.14–5.80)
RDW: 14.1 % (ref 11.6–15.4)
WBC: 6.4 10*3/uL (ref 3.4–10.8)

## 2020-07-28 LAB — VITAMIN B12: Vitamin B-12: 1092 pg/mL (ref 232–1245)

## 2020-07-28 LAB — LIPID PANEL
Chol/HDL Ratio: 4.2 ratio (ref 0.0–5.0)
Cholesterol, Total: 157 mg/dL (ref 100–199)
HDL: 37 mg/dL — ABNORMAL LOW (ref >39)
LDL Chol Calc (NIH): 82 mg/dL (ref 0–99)
Triglycerides: 227 mg/dL — ABNORMAL HIGH (ref 0–149)
VLDL Cholesterol Cal: 38 mg/dL (ref 5–40)

## 2020-07-28 LAB — HEMOGLOBIN A1C
Est. average glucose Bld gHb Est-mCnc: 123 mg/dL
Hgb A1c MFr Bld: 5.9 % — ABNORMAL HIGH (ref 4.8–5.6)

## 2020-07-28 LAB — TSH: TSH: 2.03 u[IU]/mL (ref 0.450–4.500)

## 2020-07-28 NOTE — Progress Notes (Signed)
   [id number]  Name:  Christian Lowe.    MRN: [id number] DOB: [birth year]  07/28/2020  [name] was observed post Covid-19 immunization for 15 minutes without incident. He was provided with Vaccine Information Sheet and instruction to access the V-Safe system.   [name] was instructed to call 911 with any severe reactions post vaccine: Marland Kitchen Difficulty breathing  . Swelling of face and throat  . A fast heartbeat  . A bad rash all over body  . Dizziness and weakness   Immunizations Administered    Name Date Dose VIS Date Route   Pfizer COVID-19 Vaccine 07/28/2020  1:00 AM 0.3 mL 05/26/2020 Intramuscular   Manufacturer: [place]   Lot: [id number]   [place]: [address]-8

## 2020-08-02 ENCOUNTER — Other Ambulatory Visit: Payer: Self-pay

## 2020-08-02 DIAGNOSIS — N3941 Urge incontinence: Secondary | ICD-10-CM

## 2020-08-02 MED ORDER — TOVIAZ 8 MG PO TB24: 8.0000 mg | ORAL_TABLET | Freq: Every day | ORAL | 1 refills | Status: DC

## 2020-08-04 ENCOUNTER — Other Ambulatory Visit: Payer: Self-pay | Admitting: Family Medicine

## 2020-08-19 ENCOUNTER — Ambulatory Visit (INDEPENDENT_AMBULATORY_CARE_PROVIDER_SITE_OTHER): Payer: Medicare HMO

## 2020-08-19 ENCOUNTER — Other Ambulatory Visit: Payer: Self-pay

## 2020-08-19 DIAGNOSIS — Z23 Encounter for immunization: Secondary | ICD-10-CM | POA: Diagnosis not present

## 2020-08-19 NOTE — Progress Notes (Signed)
   [id number]  Name:  Christian Lowe.    MRN: [id number] DOB: Mar 01, 1941  08/19/2020  [name] was observed post Covid-19 immunization for 15 minutes without incident. He was provided with Vaccine Information Sheet and instruction to access the V-Safe system.   [name] was instructed to call 911 with any severe reactions post vaccine: Marland Kitchen Difficulty breathing  . Swelling of face and throat  . A fast heartbeat  . A bad rash all over body  . Dizziness and weakness   Immunizations Administered    Name Date Dose VIS Date Route   Pfizer COVID-19 Vaccine 08/19/2020 10:39 AM 0.3 mL 05/26/2020 Intramuscular   Manufacturer: [place]   Lot: [id number]   [place]: [address]-0

## 2020-10-20 DIAGNOSIS — E291 Testicular hypofunction: Secondary | ICD-10-CM | POA: Diagnosis not present

## 2020-10-27 DIAGNOSIS — N529 Male erectile dysfunction, unspecified: Secondary | ICD-10-CM | POA: Diagnosis not present

## 2020-10-27 DIAGNOSIS — M2550 Pain in unspecified joint: Secondary | ICD-10-CM | POA: Diagnosis not present

## 2020-10-27 DIAGNOSIS — R69 Illness, unspecified: Secondary | ICD-10-CM | POA: Diagnosis not present

## 2020-10-27 DIAGNOSIS — R5383 Other fatigue: Secondary | ICD-10-CM | POA: Diagnosis not present

## 2020-10-27 DIAGNOSIS — E291 Testicular hypofunction: Secondary | ICD-10-CM | POA: Diagnosis not present

## 2020-10-31 DIAGNOSIS — K219 Gastro-esophageal reflux disease without esophagitis: Secondary | ICD-10-CM | POA: Insufficient documentation

## 2020-10-31 DIAGNOSIS — L720 Epidermal cyst: Secondary | ICD-10-CM

## 2020-10-31 DIAGNOSIS — K922 Gastrointestinal hemorrhage, unspecified: Secondary | ICD-10-CM | POA: Insufficient documentation

## 2020-10-31 DIAGNOSIS — L570 Actinic keratosis: Secondary | ICD-10-CM

## 2020-10-31 DIAGNOSIS — N400 Enlarged prostate without lower urinary tract symptoms: Secondary | ICD-10-CM

## 2020-10-31 DIAGNOSIS — H9190 Unspecified hearing loss, unspecified ear: Secondary | ICD-10-CM

## 2020-10-31 DIAGNOSIS — M1711 Unilateral primary osteoarthritis, right knee: Secondary | ICD-10-CM

## 2020-10-31 DIAGNOSIS — Z9849 Cataract extraction status, unspecified eye: Secondary | ICD-10-CM

## 2020-10-31 HISTORY — DX: Unspecified hearing loss, unspecified ear: H91.90

## 2020-10-31 HISTORY — DX: Unilateral primary osteoarthritis, right knee: M17.11

## 2020-10-31 HISTORY — DX: Epidermal cyst: L72.0

## 2020-10-31 HISTORY — DX: Cataract extraction status, unspecified eye: Z98.49

## 2020-10-31 HISTORY — DX: Gastro-esophageal reflux disease without esophagitis: K21.9

## 2020-10-31 HISTORY — DX: Benign prostatic hyperplasia without lower urinary tract symptoms: N40.0

## 2020-10-31 HISTORY — DX: Actinic keratosis: L57.0

## 2020-10-31 HISTORY — DX: Gastrointestinal hemorrhage, unspecified: K92.2

## 2020-10-31 NOTE — Progress Notes (Addendum)
Subjective:  Patient ID: Christian Deem., male    DOB: 11-24-1940  Age: 80 y.o. MRN: [id number]  Chief Complaint  Patient presents with  . Hyperlipidemia    HPI  Hyperlipidemia: Unable to get praluent due to cost.   Hypogonadism: Restarted to [facility]. Off testosterone shots and anastrozole, b12 shots for 3 month due to "thickened" blood. Energy has dropped.    Current Outpatient Medications on File Prior to Visit  Medication Sig Dispense Refill  . Carboxymethylcellulose Sodium 0.25 % SOLN INSTILL ONE DROP IN [facility] EYE THREE TIMES A DAY AS NEEDED    . amoxicillin (AMOXIL) 500 MG tablet SMARTSIG:4 Tablet(s) By Mouth    . fesoterodine (TOVIAZ) 8 MG TB24 tablet Take 1 tablet (8 mg total) by mouth daily. 90 tablet 1  . naproxen (NAPROSYN) 500 MG tablet TAKE 1 TABLET (500 MG TOTAL) BY MOUTH 2 (TWO) TIMES DAILY WITH A MEAL. AS NEEDED FOR PAIN. (Patient taking differently: Take 500 mg by mouth 2 (two) times daily as needed for mild pain.) 60 tablet 0  . tadalafil (CIALIS) 5 MG tablet TAKE 1 TABLET BY MOUTH AS NEEDED 30 60 MINUTES PRIOR TO SEXUAL ACTIVITY    . triamcinolone (KENALOG) 0.1 % Apply topically 2 (two) times daily.     Current Facility-Administered Medications on File Prior to Visit  Medication Dose Route Frequency Provider Last Rate Last Admin  . triamcinolone acetonide (KENALOG-40) injection 20 mg  20 mg Intra-articular Once Rochel Brome, MD       Past Medical History:  Diagnosis Date  . BPPV (benign paroxysmal positional vertigo)   . Calculus of kidney   . Calculus of kidney   . Cancer ([place]) 2015   skin cancer removed off left forearm   . GERD (gastroesophageal reflux disease)   . History of basal cell carcinoma (BCC)   . Mixed hyperlipidemia   . Osteoarthritis   . Prediabetes   . Vitamin D deficiency    Past Surgical History:  Procedure Laterality Date  . CRANIOTOMY Left 02/10/2020   Procedure: CRANIOTOMY HEMATOMA EVACUATION SUBDURAL;  Surgeon: Earnie Larsson, MD;  Location: [facility];  Service: Neurosurgery;  Laterality: Left;  . EYE SURGERY Bilateral 2012   cataracts w lens implants  . PROSTATECTOMY  2008   BPH  . 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 TEMPORAL ARTERY BIOPSY / LIGATION Bilateral 1997   negative  . TRANSURETHRAL RESECTION OF PROSTATE  2010    Family History  Problem Relation Age of Onset  . Brain cancer Mother   . CAD Father   . Alcoholism Father   . CAD Brother   . Lung cancer Brother   . CAD Brother    Social History   Socioeconomic History  . Marital status: Widowed    Spouse name: Not on file  . Number of children: Not on file  . Years of education: Not on file  . Highest education level: Not on file  Occupational History  . Not on file  Tobacco Use  . Smoking status: Former Smoker    Packs/day: 1.00    Years: 20.00    Pack years: 20.00    Types: Cigarettes    Quit date: 01/06/1967    Years since quitting: 53.8  . Smokeless tobacco: Never Used  Substance and Sexual Activity  . Alcohol use: Yes    Comment: occassionally  . Drug use: No  . Sexual activity: Not on file  Other Topics Concern  .  Not on file  Social History Narrative  . Not on file   Social Determinants of Health   Financial Resource Strain: Not on file  Food Insecurity: Not on file  Transportation Needs: Not on file  Physical Activity: Not on file  Stress: Not on file  Social Connections: Not on file    Review of Systems  Constitutional: Negative for chills, fatigue, fever and unexpected weight change.  HENT: Positive for congestion and rhinorrhea. Negative for ear pain, sinus pain and sore throat.   Respiratory: Positive for cough.   Cardiovascular: Negative for chest pain and palpitations.  Gastrointestinal: Negative for abdominal pain, blood in stool, constipation, diarrhea, nausea and vomiting.  Endocrine: Negative for polydipsia.  Genitourinary: Negative for dysuria.       Poor bladder control  Musculoskeletal:  Negative for back pain.  Skin: Negative for rash.  Neurological: Negative for headaches.  Psychiatric/Behavioral: Negative for dysphoric mood. The patient is not nervous/anxious.      Objective:  BP 120/80   Pulse 84   Temp 97.8 F (36.6 C)   Resp 18   Ht 5\' 11"  (1.803 m)   Wt 243 lb (110.2 kg)   BMI 33.89 kg/m   BP/Weight 11/01/2020 68/10/2120 11/12/2498  Systolic BP 370 488 891  Diastolic BP 80 80 80  Wt. (Lbs) 243 241.8 230  BMI 33.89 33.72 32.08    Physical Exam Constitutional:      Appearance: Normal appearance.  Cardiovascular:     Rate and Rhythm: Normal rate and regular rhythm.     Pulses: Normal pulses.     Heart sounds: Normal heart sounds.  Pulmonary:     Effort: Pulmonary effort is normal.     Breath sounds: Normal breath sounds.  Abdominal:     General: Bowel sounds are normal.     Palpations: Abdomen is soft. There is no mass.     Tenderness: There is no abdominal tenderness.  Neurological:     Mental Status: He is alert.  Psychiatric:        Mood and Affect: Mood normal.        Behavior: Behavior normal.        Thought Content: Thought content normal.     Diabetic Foot Exam - Simple   No data filed      Lab Results  Component Value Date   WBC 6.4 07/27/2020   HGB 16.2 07/27/2020   HCT 48.2 07/27/2020   PLT 218 07/27/2020   GLUCOSE 118 (H) 07/27/2020   CHOL 157 07/27/2020   TRIG 227 (H) 07/27/2020   HDL 37 (L) 07/27/2020   LDLCALC 82 07/27/2020   ALT 7 07/27/2020   AST 7 07/27/2020   NA 141 07/27/2020   K 4.5 07/27/2020   CL 103 07/27/2020   CREATININE 1.03 07/27/2020   BUN 16 07/27/2020   CO2 25 07/27/2020   TSH 2.030 07/27/2020   INR 1.1 02/10/2020   HGBA1C 5.9 (H) 07/27/2020      Assessment & Plan:   1. Prediabetes Well controlled.  No changes to medicines.  Continue to work on eating a healthy diet and exercise.  Labs drawn today.  - A1C  2. Mixed hyperlipidemia Cholesterol was excellent at last visit, but was  taking praluent.  Refer to pharmacist to hopefully get pt assistance. Continue to work on eating a healthy diet and exercise.  Labs drawn today.  - Comprehensive metabolic panel - Lipid panel - CBC with Differential/Platelet - AMB Referral to  [facility]  3. Urge incontinence The current medical regimen is effective;  continue present plan and medications.  4. Atherosclerosis of aorta (HCC) - AMB Referral to [facility]  5. Benign prostatic hyperplasia, unspecified whether lower urinary tract symptoms present   6. bmi 33. Recommend continue to work on eating healthy diet and exercise.  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   No orders of the defined types were placed in this encounter.   Orders Placed This Encounter  Procedures  . Comprehensive metabolic panel  . Hemoglobin A1c  . Lipid panel  . CBC with Differential/Platelet  . AMB Referral to [facility]     Follow-up: Return in about 4 months (around 03/03/2021) for fasting.  An After Visit Summary was printed and given to the patient.  Rochel Brome, MD [facility] [phone]

## 2020-11-01 ENCOUNTER — Ambulatory Visit (INDEPENDENT_AMBULATORY_CARE_PROVIDER_SITE_OTHER): Payer: Medicare HMO | Admitting: Family Medicine

## 2020-11-01 ENCOUNTER — Encounter: Payer: Self-pay | Admitting: Family Medicine

## 2020-11-01 ENCOUNTER — Other Ambulatory Visit: Payer: Self-pay

## 2020-11-01 VITALS — BP 120/80 | HR 84 | Temp 97.8°F | Resp 18 | Ht 71.0 in | Wt 243.0 lb

## 2020-11-01 DIAGNOSIS — R7303 Prediabetes: Secondary | ICD-10-CM

## 2020-11-01 DIAGNOSIS — E782 Mixed hyperlipidemia: Secondary | ICD-10-CM

## 2020-11-01 DIAGNOSIS — I10 Essential (primary) hypertension: Secondary | ICD-10-CM | POA: Diagnosis not present

## 2020-11-01 DIAGNOSIS — N3941 Urge incontinence: Secondary | ICD-10-CM

## 2020-11-01 DIAGNOSIS — N400 Enlarged prostate without lower urinary tract symptoms: Secondary | ICD-10-CM

## 2020-11-01 DIAGNOSIS — E559 Vitamin D deficiency, unspecified: Secondary | ICD-10-CM | POA: Insufficient documentation

## 2020-11-01 DIAGNOSIS — K635 Polyp of colon: Secondary | ICD-10-CM

## 2020-11-01 DIAGNOSIS — I700 Atherosclerosis of aorta: Secondary | ICD-10-CM | POA: Diagnosis not present

## 2020-11-01 DIAGNOSIS — G720 Drug-induced myopathy: Secondary | ICD-10-CM

## 2020-11-01 DIAGNOSIS — Z6833 Body mass index (BMI) 33.0-33.9, adult: Secondary | ICD-10-CM

## 2020-11-01 HISTORY — DX: Polyp of colon: K63.5

## 2020-11-01 NOTE — Patient Instructions (Addendum)
Get [facility] AND TETANUS (TDAP) AT THE PHARMACY Recommend continue to work on eating healthy diet and exercise. Allergic rhinitis: try zyrtec  Allergic Rhinitis, Adult Allergic rhinitis is a reaction to allergens. Allergens are things that can cause an allergic reaction. This condition affects the lining inside the nose (mucous membrane). There are two types of allergic rhinitis:  Seasonal. This type is also called hay fever. It happens only during some times of the year.  Perennial. This type can happen at any time of the year. This condition cannot be spread from person to person (is not contagious). It can be mild, worse, or very bad. It can develop at any age and may be outgrown. What are the causes? This condition may be caused by:  Pollen from grasses, trees, and weeds.  Dust mites.  Smoke.  Mold.  Car fumes.  The pee (urine), spit, or dander of pets. Dander is dead skin cells from a pet.   What increases the risk? You are more likely to develop this condition if:  You have allergies in your family.  You have problems like allergies in your family. You may have: ? Swelling of parts of your eyes and eyelids. ? Asthma. This affects how you breathe. ? Long-term redness and swelling on your skin. ? Food allergies. What are the signs or symptoms? The main symptom of this condition is a runny or stuffy nose (nasal congestion). Other symptoms may include:  Sneezing or coughing.  Itching and tearing of your eyes.  Mucus that drips down the back of your throat (postnasal drip).  Trouble sleeping.  Feeling tired.  Headache.  Sore throat. How is this treated? There is no cure for this condition. You should avoid things that you are allergic to. Treatment can help to relieve symptoms. This may include:  Medicines that block allergy symptoms, such as corticosteroids or antihistamines. These may be given as a shot, nasal spray, or pill.  Avoiding things you are  allergic to.  Medicines that give you bits of what you are allergic to over time. This is called immunotherapy. It is done if other treatments do not help. You may get: ? Shots. ? Medicine under your tongue.  Stronger medicines, if other treatments do not help. Follow these instructions at home: Avoiding allergens Find out what things you are allergic to and avoid them. To do this, try these things:  If you get allergies any time of year: ? Replace carpet with wood, tile, or vinyl flooring. Carpet can trap pet dander and dust. ? Do not smoke. Do not allow smoking in your home. ? Change your heating and air conditioning filters at least once a month.  If you get allergies only some times of the year: ? Keep windows closed when you can. ? Plan things to do outside when pollen counts are lowest. Check pollen counts before you plan things to do outside. ? When you come indoors, change your clothes and shower before you sit on furniture or bedding.   If you are allergic to a pet: ? Keep the pet out of your bedroom. ? Vacuum, sweep, and dust often.   General instructions  Take over-the-counter and prescription medicines only as told by your doctor.  Drink enough fluid to keep your pee (urine) pale yellow.  Keep all follow-up visits as told by your doctor. This is important. Where to find more information  American Academy of Allergy, Asthma & Immunology: www.aaaai.org Contact a doctor if:  You have  a fever.  You get a cough that does not go away.  You make whistling sounds when you breathe (wheeze).  Your symptoms slow you down.  Your symptoms stop you from doing your normal things each day. Get help right away if:  You are short of breath. This symptom may be an emergency. Do not wait to see if the symptom will go away. Get medical help right away. Call your local emergency services (911 in the [place] Do not drive yourself to the hospital. Summary  Allergic rhinitis may  be treated by taking medicines and avoiding things you are allergic to.  If you have allergies only some of the year, keep windows closed when you can at those times.  Contact your doctor if you get a fever or a cough that does not go away. This information is not intended to replace advice given to you by your health care provider. Make sure you discuss any questions you have with your health care provider. Document Revised: 09/15/2019 Document Reviewed: 07/22/2019 Elsevier Patient Education  2021 [employer].

## 2020-11-02 ENCOUNTER — Other Ambulatory Visit: Payer: Self-pay

## 2020-11-02 DIAGNOSIS — J410 Simple chronic bronchitis: Secondary | ICD-10-CM

## 2020-11-02 DIAGNOSIS — I700 Atherosclerosis of aorta: Secondary | ICD-10-CM

## 2020-11-02 DIAGNOSIS — R7303 Prediabetes: Secondary | ICD-10-CM

## 2020-11-02 DIAGNOSIS — R002 Palpitations: Secondary | ICD-10-CM

## 2020-11-02 LAB — COMPREHENSIVE METABOLIC PANEL
ALT: 18 IU/L (ref 0–44)
AST: 15 IU/L (ref 0–40)
Albumin/Globulin Ratio: 1.7 (ref 1.2–2.2)
Albumin: 4.2 g/dL (ref 3.7–4.7)
Alkaline Phosphatase: 66 IU/L (ref 44–121)
BUN/Creatinine Ratio: 16 (ref 10–24)
BUN: 15 mg/dL (ref 8–27)
Bilirubin Total: 0.5 mg/dL (ref 0.0–1.2)
CO2: 20 mmol/L (ref 20–29)
Calcium: 9.3 mg/dL (ref 8.6–10.2)
Chloride: 104 mmol/L (ref 96–106)
Creatinine, Ser: 0.91 mg/dL (ref 0.76–1.27)
Globulin, Total: 2.5 g/dL (ref 1.5–4.5)
Glucose: 128 mg/dL — ABNORMAL HIGH (ref 65–99)
Potassium: 4.3 mmol/L (ref 3.5–5.2)
Sodium: 139 mmol/L (ref 134–144)
Total Protein: 6.7 g/dL (ref 6.0–8.5)
eGFR: 85 mL/min/{1.73_m2} (ref >59)

## 2020-11-02 LAB — CBC WITH DIFFERENTIAL/PLATELET
Basophils Absolute: 0.1 10*3/uL (ref 0.0–0.2)
Basos: 1 %
EOS (ABSOLUTE): 0.2 10*3/uL (ref 0.0–0.4)
Eos: 4 %
Hematocrit: 44.9 % (ref 37.5–51.0)
Hemoglobin: 15.6 g/dL (ref 13.0–17.7)
Immature Grans (Abs): 0.1 10*3/uL (ref 0.0–0.1)
Immature Granulocytes: 1 %
Lymphocytes Absolute: 1.2 10*3/uL (ref 0.7–3.1)
Lymphs: 21 %
MCH: 29.7 pg (ref 26.6–33.0)
MCHC: 34.7 g/dL (ref 31.5–35.7)
MCV: 86 fL (ref 79–97)
Monocytes Absolute: 0.5 10*3/uL (ref 0.1–0.9)
Monocytes: 9 %
Neutrophils Absolute: 3.7 10*3/uL (ref 1.4–7.0)
Neutrophils: 64 %
Platelets: 201 10*3/uL (ref 150–450)
RBC: 5.25 x10E6/uL (ref 4.14–5.80)
RDW: 16.9 % — ABNORMAL HIGH (ref 11.6–15.4)
WBC: 5.8 10*3/uL (ref 3.4–10.8)

## 2020-11-02 LAB — HEMOGLOBIN A1C
Est. average glucose Bld gHb Est-mCnc: 131 mg/dL
Hgb A1c MFr Bld: 6.2 % — ABNORMAL HIGH (ref 4.8–5.6)

## 2020-11-02 LAB — LIPID PANEL
Chol/HDL Ratio: 6.8 ratio — ABNORMAL HIGH (ref 0.0–5.0)
Cholesterol, Total: 243 mg/dL — ABNORMAL HIGH (ref 100–199)
HDL: 36 mg/dL — ABNORMAL LOW (ref >39)
LDL Chol Calc (NIH): 142 mg/dL — ABNORMAL HIGH (ref 0–99)
Triglycerides: 354 mg/dL — ABNORMAL HIGH (ref 0–149)
VLDL Cholesterol Cal: 65 mg/dL — ABNORMAL HIGH (ref 5–40)

## 2020-11-02 LAB — CARDIOVASCULAR RISK ASSESSMENT

## 2020-11-02 MED ORDER — EZETIMIBE 10 MG PO TABS: 10.0000 mg | ORAL_TABLET | Freq: Every day | ORAL | 0 refills | Status: DC

## 2020-11-02 MED ORDER — METFORMIN HCL 500 MG PO TABS: 500.0000 mg | ORAL_TABLET | Freq: Every day | ORAL | 0 refills | Status: DC

## 2020-11-03 DIAGNOSIS — E291 Testicular hypofunction: Secondary | ICD-10-CM | POA: Diagnosis not present

## 2020-11-04 DIAGNOSIS — C44519 Basal cell carcinoma of skin of other part of trunk: Secondary | ICD-10-CM | POA: Diagnosis not present

## 2020-11-04 DIAGNOSIS — L821 Other seborrheic keratosis: Secondary | ICD-10-CM | POA: Diagnosis not present

## 2020-11-04 DIAGNOSIS — L570 Actinic keratosis: Secondary | ICD-10-CM | POA: Diagnosis not present

## 2020-11-04 DIAGNOSIS — C44329 Squamous cell carcinoma of skin of other parts of face: Secondary | ICD-10-CM | POA: Diagnosis not present

## 2020-11-04 DIAGNOSIS — L578 Other skin changes due to chronic exposure to nonionizing radiation: Secondary | ICD-10-CM | POA: Diagnosis not present

## 2020-11-10 DIAGNOSIS — E291 Testicular hypofunction: Secondary | ICD-10-CM | POA: Diagnosis not present

## 2020-11-18 DIAGNOSIS — E291 Testicular hypofunction: Secondary | ICD-10-CM | POA: Diagnosis not present

## 2020-11-23 DIAGNOSIS — Z79890 Hormone replacement therapy (postmenopausal): Secondary | ICD-10-CM

## 2020-11-23 DIAGNOSIS — Z85828 Personal history of other malignant neoplasm of skin: Secondary | ICD-10-CM | POA: Insufficient documentation

## 2020-11-23 DIAGNOSIS — E1169 Type 2 diabetes mellitus with other specified complication: Secondary | ICD-10-CM | POA: Insufficient documentation

## 2020-11-23 DIAGNOSIS — C4491 Basal cell carcinoma of skin, unspecified: Secondary | ICD-10-CM

## 2020-11-23 DIAGNOSIS — M2550 Pain in unspecified joint: Secondary | ICD-10-CM | POA: Insufficient documentation

## 2020-11-23 DIAGNOSIS — E782 Mixed hyperlipidemia: Secondary | ICD-10-CM | POA: Insufficient documentation

## 2020-11-23 DIAGNOSIS — E88819 Insulin resistance, unspecified: Secondary | ICD-10-CM

## 2020-11-23 DIAGNOSIS — N3941 Urge incontinence: Secondary | ICD-10-CM | POA: Insufficient documentation

## 2020-11-23 DIAGNOSIS — E1121 Type 2 diabetes mellitus with diabetic nephropathy: Secondary | ICD-10-CM | POA: Insufficient documentation

## 2020-11-23 DIAGNOSIS — E291 Testicular hypofunction: Secondary | ICD-10-CM

## 2020-11-23 DIAGNOSIS — E8881 Metabolic syndrome: Secondary | ICD-10-CM

## 2020-11-23 DIAGNOSIS — N200 Calculus of kidney: Secondary | ICD-10-CM | POA: Insufficient documentation

## 2020-11-23 DIAGNOSIS — R5383 Other fatigue: Secondary | ICD-10-CM | POA: Insufficient documentation

## 2020-11-23 DIAGNOSIS — Z6833 Body mass index (BMI) 33.0-33.9, adult: Secondary | ICD-10-CM | POA: Insufficient documentation

## 2020-11-23 DIAGNOSIS — K219 Gastro-esophageal reflux disease without esophagitis: Secondary | ICD-10-CM | POA: Insufficient documentation

## 2020-11-23 DIAGNOSIS — N529 Male erectile dysfunction, unspecified: Secondary | ICD-10-CM

## 2020-11-23 DIAGNOSIS — R6882 Decreased libido: Secondary | ICD-10-CM

## 2020-11-23 DIAGNOSIS — G479 Sleep disorder, unspecified: Secondary | ICD-10-CM

## 2020-11-23 DIAGNOSIS — M1990 Unspecified osteoarthritis, unspecified site: Secondary | ICD-10-CM | POA: Insufficient documentation

## 2020-11-23 DIAGNOSIS — H8110 Benign paroxysmal vertigo, unspecified ear: Secondary | ICD-10-CM | POA: Insufficient documentation

## 2020-11-23 DIAGNOSIS — R7303 Prediabetes: Secondary | ICD-10-CM | POA: Insufficient documentation

## 2020-11-23 HISTORY — DX: Metabolic syndrome: E88.81

## 2020-11-23 HISTORY — DX: Insulin resistance, unspecified: E88.819

## 2020-11-23 HISTORY — DX: Hormone replacement therapy: Z79.890

## 2020-11-23 HISTORY — DX: Other fatigue: R53.83

## 2020-11-23 HISTORY — DX: Sleep disorder, unspecified: G47.9

## 2020-11-23 HISTORY — DX: Pain in unspecified joint: M25.50

## 2020-11-23 HISTORY — DX: Urge incontinence: N39.41

## 2020-11-23 HISTORY — DX: Decreased libido: R68.82

## 2020-11-23 HISTORY — DX: Male erectile dysfunction, unspecified: N52.9

## 2020-11-23 HISTORY — DX: Basal cell carcinoma of skin, unspecified: C44.91

## 2020-11-23 HISTORY — DX: Testicular hypofunction: E29.1

## 2020-11-23 HISTORY — DX: Body mass index (BMI) 33.0-33.9, adult: Z68.33

## 2020-11-25 ENCOUNTER — Ambulatory Visit: Payer: Medicare HMO | Admitting: Cardiology

## 2020-11-25 DIAGNOSIS — E291 Testicular hypofunction: Secondary | ICD-10-CM | POA: Diagnosis not present

## 2020-12-02 DIAGNOSIS — E291 Testicular hypofunction: Secondary | ICD-10-CM | POA: Diagnosis not present

## 2020-12-15 ENCOUNTER — Ambulatory Visit (INDEPENDENT_AMBULATORY_CARE_PROVIDER_SITE_OTHER): Payer: Medicare HMO | Admitting: Family Medicine

## 2020-12-15 ENCOUNTER — Ambulatory Visit (INDEPENDENT_AMBULATORY_CARE_PROVIDER_SITE_OTHER): Payer: Medicare HMO

## 2020-12-15 ENCOUNTER — Other Ambulatory Visit: Payer: Self-pay

## 2020-12-15 VITALS — BP 140/80 | HR 60 | Temp 98.0°F | Resp 16 | Ht 71.0 in | Wt 243.0 lb

## 2020-12-15 DIAGNOSIS — M545 Low back pain, unspecified: Secondary | ICD-10-CM | POA: Diagnosis not present

## 2020-12-15 DIAGNOSIS — N3941 Urge incontinence: Secondary | ICD-10-CM

## 2020-12-15 DIAGNOSIS — I700 Atherosclerosis of aorta: Secondary | ICD-10-CM | POA: Diagnosis not present

## 2020-12-15 DIAGNOSIS — N400 Enlarged prostate without lower urinary tract symptoms: Secondary | ICD-10-CM | POA: Diagnosis not present

## 2020-12-15 DIAGNOSIS — J410 Simple chronic bronchitis: Secondary | ICD-10-CM | POA: Diagnosis not present

## 2020-12-15 DIAGNOSIS — Z23 Encounter for immunization: Secondary | ICD-10-CM

## 2020-12-15 NOTE — Progress Notes (Addendum)
Acute Office Visit  Subjective:    Patient ID: Christian Lowe., male    DOB: 04/09/1941, 80 y.o.   MRN: [id number]  Chief Complaint  Patient presents with  . Back Pain    HPI Patient is in today for back pain which started on Monday. No injury. Drove down to a funeral in [place]. Really bad yesterday. Took Naproxen 500 mg one twice a day. Improving today.   Past Medical History:  Diagnosis Date  . Actinic keratosis 10/31/2020  . Benign prostatic hyperplasia 10/31/2020   Mar 13, 2014 Entered By: Alycia Rossetti B Comment: s/p TURP 2010  . Body mass index (BMI) 33.0-33.9, adult 11/23/2020  . BPPV (benign paroxysmal positional vertigo)   . Calculus of kidney   . Calculus of kidney   . Cancer ([place]) 2015   skin cancer removed off left forearm   . Cataract extraction status 10/31/2020   Apr 17, 2009 Entered By: Alinda [facility] Comment: bilateral  . Closed fracture of body of sternum 11/10/2019  . Contusion of right thigh 11/10/2019  . 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 Epidermoid cyst of skin 10/31/2020  . Fatigue 11/23/2020  . Gastroesophageal reflux disease 10/31/2020  . GERD (gastroesophageal reflux disease)   . 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  . Hemorrhage of gastrointestinal tract 10/31/2020  . History of basal cell carcinoma (BCC)   . 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 Joint pain 11/23/2020  . Long-term current use of testosterone replacement therapy 11/23/2020  . Low libido 11/23/2020  . Mixed hyperlipidemia   . Osteoarthritis   . 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  . Polyp of colon 11/01/2020   Aug 31, 2015 Entered By: Alycia Rossetti B Comment: colonoscopy 05/2015 - needs repeat ASAP  . Prediabetes   . SDH (subdural hematoma) ([place]) 02/26/2020  . Sleep disturbance 11/23/2020  . Status post craniotomy 02/10/2020  . 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  . 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 Urge incontinence of urine 11/23/2020  . Vitamin D deficiency      Past Surgical History:  Procedure Laterality Date  . CRANIOTOMY Left 02/10/2020   Procedure: CRANIOTOMY HEMATOMA EVACUATION SUBDURAL;  Surgeon: Earnie Larsson, MD;  Location: [facility];  Service: Neurosurgery;  Laterality: Left;  . EYE SURGERY Bilateral 2012   cataracts w lens implants  . PROSTATECTOMY  2008   BPH  . 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 TEMPORAL ARTERY BIOPSY / LIGATION Bilateral 1997   negative  . TRANSURETHRAL RESECTION OF PROSTATE  2010    Family History  Problem Relation Age of Onset  . Brain cancer Mother   . CAD Father   . Alcoholism Father   . CAD Brother   . Lung cancer Brother   . CAD Brother     Social History   Socioeconomic History  . Marital status: Widowed    Spouse name: Not on file  . Number of children: Not on file  . Years of education: Not on file  . Highest education level: Not on file  Occupational History  . Not on file  Tobacco Use  . Smoking status: Former Smoker    Packs/day: 1.00    Years: 20.00    Pack years: 20.00    Types: Cigarettes    Quit date: 01/06/1967    Years since quitting: 53.9  . Smokeless tobacco: Never Used  Substance and Sexual Activity  . Alcohol use: Yes    Comment: occassionally  . Drug use: No  . Sexual  activity: Not on file  Other Topics Concern  . Not on file  Social History Narrative  . Not on file   Social Determinants of Health   Financial Resource Strain: Not on file  Food Insecurity: Not on file  Transportation Needs: Not on file  Physical Activity: Not on file  Stress: Not on file  Social Connections: Not on file  Intimate Partner Violence: Not on file    Outpatient Medications Prior to Visit  Medication Sig Dispense Refill  . Carboxymethylcellulose Sodium 0.25 % SOLN INSTILL ONE DROP IN [facility] EYE THREE TIMES A DAY AS NEEDED    . ezetimibe (ZETIA) 10 MG tablet Take 1 tablet (10 mg total) by mouth daily. 90 tablet 0  . fesoterodine (TOVIAZ) 8 MG TB24 tablet Take 1 tablet (8 mg  total) by mouth daily. 90 tablet 1  . metFORMIN (GLUCOPHAGE) 500 MG tablet Take 1 tablet (500 mg total) by mouth daily. 90 tablet 0  . naproxen (NAPROSYN) 500 MG tablet TAKE 1 TABLET (500 MG TOTAL) BY MOUTH 2 (TWO) TIMES DAILY WITH A MEAL. AS NEEDED FOR PAIN. (Patient taking differently: Take 500 mg by mouth 2 (two) times daily as needed for mild pain.) 60 tablet 0  . tadalafil (CIALIS) 5 MG tablet TAKE 1 TABLET BY MOUTH AS NEEDED 30 60 MINUTES PRIOR TO SEXUAL ACTIVITY    . triamcinolone (KENALOG) 0.1 % Apply topically 2 (two) times daily.    Marland Kitchen amoxicillin (AMOXIL) 500 MG tablet SMARTSIG:4 Tablet(s) By Mouth     Facility-Administered Medications Prior to Visit  Medication Dose Route Frequency Provider Last Rate Last Admin  . triamcinolone acetonide (KENALOG-40) injection 20 mg  20 mg Intra-articular Once Rochel Brome, MD        Allergies  Allergen Reactions  . Atorvastatin     Other reaction(s): Muscle pain  . Codeine Other (See Comments)  . Niacin Itching    Other reaction(s): Itching, Flushing  . No Known Allergies     Other reaction(s): Dizziness  . Piroxicam     Other reaction(s): Gastric ulcer with hemorrhage  . Pravastatin     Other reaction(s): Muscle pain  . Statins     myalgias  . Zocor [Simvastatin]     Other reaction(s): Muscle pain    Review of Systems  Constitutional: Negative for chills and fever.  HENT: Negative for congestion, ear pain and sore throat.   Respiratory: Negative for cough and shortness of breath.   Cardiovascular: Negative for chest pain.  Gastrointestinal: Negative for abdominal pain, constipation, nausea and vomiting.       Objective:    Physical Exam Vitals reviewed.  Constitutional:      Appearance: Normal appearance. He is obese.  Cardiovascular:     Rate and Rhythm: Normal rate and regular rhythm.     Heart sounds: Normal heart sounds.  Pulmonary:     Effort: Pulmonary effort is normal.     Breath sounds: Normal breath sounds. No  wheezing, rhonchi or rales.  Abdominal:     General: Bowel sounds are normal.     Palpations: Abdomen is soft.     Tenderness: There is no abdominal tenderness.  Musculoskeletal:        General: No tenderness (lumbar. negative SLR BL. ).  Neurological:     Mental Status: He is alert.  Psychiatric:        Mood and Affect: Mood normal.        Behavior: Behavior normal.  BP 140/80   Pulse 60   Temp 98 F (36.7 C)   Resp 16   Ht _0  (1.803 m)   Wt 243 lb (110.2 kg)   BMI 33.89 kg/m  Wt Readings from Last 3 Encounters:  12/15/20 243 lb (110.2 kg)  11/01/20 243 lb (110.2 kg)  07/27/20 241 lb 12.8 oz (109.7 kg)    There are no preventive care reminders to display for this patient.  There are no preventive care reminders to display for this patient.   Lab Results  Component Value Date   TSH 2.030 07/27/2020   Lab Results  Component Value Date   WBC 5.8 11/01/2020   HGB 15.6 11/01/2020   HCT 44.9 11/01/2020   MCV 86 11/01/2020   PLT 201 11/01/2020   Lab Results  Component Value Date   NA 139 11/01/2020   K 4.3 11/01/2020   CO2 20 11/01/2020   GLUCOSE 128 (H) 11/01/2020   BUN 15 11/01/2020   CREATININE 0.91 11/01/2020   BILITOT 0.5 11/01/2020   ALKPHOS 66 11/01/2020   AST 15 11/01/2020   ALT 18 11/01/2020   PROT 6.7 11/01/2020   ALBUMIN 4.2 11/01/2020   CALCIUM 9.3 11/01/2020   ANIONGAP 11 02/11/2020   EGFR 85 11/01/2020   Lab Results  Component Value Date   CHOL 243 (H) 11/01/2020   Lab Results  Component Value Date   HDL 36 (L) 11/01/2020   Lab Results  Component Value Date   LDLCALC 142 (H) 11/01/2020   Lab Results  Component Value Date   TRIG 354 (H) 11/01/2020   Lab Results  Component Value Date   CHOLHDL 6.8 (H) 11/01/2020   Lab Results  Component Value Date   HGBA1C 6.2 (H) 11/01/2020       Assessment & Plan:  1. Atherosclerosis of aorta (HCC) ON zetia 10 mg once daily  2. Simple chronic bronchitis (HCC) Currently on  no inhalers and does not feel he needs them.   3. Urge incontinence Continue toviaz. Very expensive.   4. Benign prostatic hyperplasia, unspecified whether lower urinary tract symptoms present  Continue cialis 5 mg once daily and toviaz.  5. Lumbar back pain:  Recommend naproxen, ice, heat, stretching.   Follow-up: Return if symptoms worsen or fail to improve.  An After Visit Summary was printed and given to the patient.  Rochel Brome, MD [facility] [phone]

## 2020-12-19 ENCOUNTER — Encounter: Payer: Self-pay | Admitting: Family Medicine

## 2020-12-19 NOTE — Addendum Note (Signed)
Addended byRochel Brome on: 12/19/2020 04:29 PM   Modules accepted: Level of Service

## 2020-12-22 ENCOUNTER — Ambulatory Visit: Payer: Medicare HMO | Admitting: Cardiology

## 2021-01-14 DIAGNOSIS — E291 Testicular hypofunction: Secondary | ICD-10-CM | POA: Diagnosis not present

## 2021-01-14 DIAGNOSIS — N401 Enlarged prostate with lower urinary tract symptoms: Secondary | ICD-10-CM | POA: Diagnosis not present

## 2021-01-14 DIAGNOSIS — Z87438 Personal history of other diseases of male genital organs: Secondary | ICD-10-CM | POA: Diagnosis not present

## 2021-01-24 DIAGNOSIS — Z87820 Personal history of traumatic brain injury: Secondary | ICD-10-CM

## 2021-01-24 HISTORY — DX: Personal history of traumatic brain injury: Z87.820

## 2021-01-26 ENCOUNTER — Ambulatory Visit: Payer: Medicare HMO | Admitting: Cardiology

## 2021-01-28 ENCOUNTER — Ambulatory Visit: Payer: Medicare HMO | Admitting: Cardiology

## 2021-02-03 ENCOUNTER — Other Ambulatory Visit: Payer: Self-pay | Admitting: Family Medicine

## 2021-02-20 ENCOUNTER — Other Ambulatory Visit: Payer: Self-pay | Admitting: Family Medicine

## 2021-02-23 ENCOUNTER — Encounter: Payer: Self-pay | Admitting: Nurse Practitioner

## 2021-02-23 ENCOUNTER — Other Ambulatory Visit: Payer: Self-pay

## 2021-02-23 ENCOUNTER — Ambulatory Visit (INDEPENDENT_AMBULATORY_CARE_PROVIDER_SITE_OTHER): Payer: Medicare HMO | Admitting: Nurse Practitioner

## 2021-02-23 ENCOUNTER — Other Ambulatory Visit: Payer: Self-pay | Admitting: Family Medicine

## 2021-02-23 VITALS — BP 142/88 | HR 82 | Temp 98.2°F | Ht 71.0 in | Wt 246.0 lb

## 2021-02-23 DIAGNOSIS — E782 Mixed hyperlipidemia: Secondary | ICD-10-CM

## 2021-02-23 DIAGNOSIS — M79642 Pain in left hand: Secondary | ICD-10-CM | POA: Diagnosis not present

## 2021-02-23 DIAGNOSIS — M79641 Pain in right hand: Secondary | ICD-10-CM

## 2021-02-23 DIAGNOSIS — M19041 Primary osteoarthritis, right hand: Secondary | ICD-10-CM | POA: Diagnosis not present

## 2021-02-23 DIAGNOSIS — M19042 Primary osteoarthritis, left hand: Secondary | ICD-10-CM

## 2021-02-23 DIAGNOSIS — E781 Pure hyperglyceridemia: Secondary | ICD-10-CM

## 2021-02-23 MED ORDER — ICOSAPENT ETHYL 1 G PO CAPS: 2.0000 g | ORAL_CAPSULE | Freq: Two times a day (BID) | ORAL | 3 refills | Status: DC

## 2021-02-23 MED ORDER — TRIAMCINOLONE ACETONIDE 40 MG/ML IJ SUSP
60.0000 mg | Freq: Once | INTRAMUSCULAR | Status: AC
  Administered 2021-02-23: 60 mg via INTRAMUSCULAR

## 2021-02-23 MED ORDER — KETOROLAC TROMETHAMINE 60 MG/2ML IM SOLN
60.0000 mg | Freq: Once | INTRAMUSCULAR | Status: AC
Start: 2021-02-23 — End: 2021-02-23
  Administered 2021-02-23: 60 mg via INTRAMUSCULAR

## 2021-02-23 MED ORDER — DICLOFENAC SODIUM 1 % EX GEL: 2.0000 g | Freq: Four times a day (QID) | CUTANEOUS | 1 refills | Status: DC

## 2021-02-23 NOTE — Progress Notes (Signed)
Acute Office Visit  Subjective:    Patient ID: Christian Munch., male    DOB: March 27, 1941, 80 y.o.   MRN: [id number]  Chief Complaint  Patient presents with   Hand pain    Bilateral   Edema    Bilateral legs    HPI Christian Lowe is a 80 year old Caucasian male that  is in today for bilateral hand pain and decreased range of motion.  Pain is described as aching 9/10. No alleviating factors. Activity aggravates. Onset was a few months. Treatment has included ASA. Christian Lowe tells me that he is a retired Nature conservation officer with a past history of osteoarthritis. He denies previous surgery, injury, or trauma to bilateral hands. He tells me that he recently began Zetia for hyperlipidemia. Has a past history of statin intolerance due to arthralgias and myalgias.    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   Epidermoid cyst of skin 10/31/2020   Fatigue 11/23/2020   Gastroesophageal reflux disease 10/31/2020   GERD (gastroesophageal reflux disease)    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   Hemorrhage of gastrointestinal tract 10/31/2020   History of basal cell carcinoma (BCC)    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   Joint pain 11/23/2020   Long-term current use of testosterone replacement therapy 11/23/2020   Low libido 11/23/2020   Mixed hyperlipidemia    Osteoarthritis    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   Polyp of colon 11/01/2020    Aug 31, 2015 Entered By: Alycia Rossetti B Comment: colonoscopy 05/2015 - needs repeat ASAP   Prediabetes    SDH (subdural hematoma) ([place]) 02/26/2020   Sleep disturbance 11/23/2020   Status post craniotomy 02/10/2020   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   Urge incontinence of urine 11/23/2020   Vitamin D deficiency     Past Surgical History:  Procedure Laterality Date   CRANIOTOMY Left 02/10/2020   Procedure: CRANIOTOMY HEMATOMA EVACUATION SUBDURAL;  Surgeon: Earnie Larsson, MD;  Location: [facility];  Service: Neurosurgery;  Laterality: Left;   EYE SURGERY Bilateral 2012   cataracts w lens implants   PROSTATECTOMY  2008   BPH   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   TEMPORAL ARTERY BIOPSY / LIGATION Bilateral 1997   negative   TRANSURETHRAL RESECTION OF PROSTATE  2010    Family History  Problem Relation Age of Onset   Brain cancer Mother    CAD Father    Alcoholism Father    CAD Brother    Lung cancer Brother    CAD Brother     Social History   Socioeconomic History   Marital status: Widowed    Spouse name: Not on file   Number of children: Not on file   Years of education: Not on file   Highest education level: Not on file  Occupational History   Not on file  Tobacco Use  Smoking status: Former    Packs/day: 1.00    Years: 20.00    Pack years: 20.00    Types: Cigarettes    Quit date: 01/06/1967    Years since quitting: 54.1   Smokeless tobacco: Never  Substance and Sexual Activity   Alcohol use: Yes    Comment: occassionally   Drug use: No   Sexual activity: Not on file  Other Topics Concern   Not on file  Social History Narrative   Not on file   Social Determinants of Health   Financial Resource Strain: Not on file  Food Insecurity: Not on file  Transportation Needs: Not on file  Physical Activity: Not on file  Stress: Not on file  Social Connections: Not on file  Intimate Partner Violence: Not on file     Outpatient Medications Prior to Visit  Medication Sig Dispense Refill   Carboxymethylcellulose Sodium 0.25 % SOLN INSTILL ONE DROP IN [facility] EYE THREE TIMES A DAY AS NEEDED     ezetimibe (ZETIA) 10 MG tablet TAKE 1 TABLET BY MOUTH EVERY DAY 90 tablet 0   metFORMIN (GLUCOPHAGE) 500 MG tablet TAKE 1 TABLET (500 MG TOTAL) BY MOUTH DAILY. 90 tablet 0   naproxen (NAPROSYN) 500 MG tablet TAKE 1 TABLET (500 MG TOTAL) BY MOUTH 2 (TWO) TIMES DAILY WITH A MEAL. AS NEEDED FOR PAIN. (Patient taking differently: Take 500 mg by mouth 2 (two) times daily as needed for mild pain.) 60 tablet 0   oxybutynin (DITROPAN-XL) 10 MG 24 hr tablet Take 10 mg by mouth daily.     tadalafil (CIALIS) 10 MG tablet SMARTSIG:1 Tablet(s) By Mouth     triamcinolone (KENALOG) 0.1 % Apply topically 2 (two) times daily.     fesoterodine (TOVIAZ) 8 MG TB24 tablet Take 1 tablet (8 mg total) by mouth daily. 90 tablet 1   tadalafil (CIALIS) 5 MG tablet TAKE 1 TABLET BY MOUTH AS NEEDED 30 60 MINUTES PRIOR TO SEXUAL ACTIVITY     triamcinolone acetonide (KENALOG-40) injection 20 mg      No facility-administered medications prior to visit.    Allergies  Allergen Reactions   Atorvastatin     Other reaction(s): Muscle pain   Codeine Other (See Comments)   Niacin Itching    Other reaction(s): Itching, Flushing   No Known Allergies     Other reaction(s): Dizziness   Piroxicam     Other reaction(s): Gastric ulcer with hemorrhage   Pravastatin     Other reaction(s): Muscle pain   Statins     myalgias   Zocor [Simvastatin]     Other reaction(s): Muscle pain    Review of Systems  Constitutional:  Negative for appetite change, fatigue and fever.  HENT:  Negative for congestion, ear pain and sore throat.   Eyes: Negative.   Respiratory:  Negative for cough and shortness of breath.   Cardiovascular:  Positive for leg swelling (Bilateral). Negative for chest pain.  Gastrointestinal:  Negative for abdominal pain, constipation,  diarrhea, nausea and vomiting.  Endocrine: Negative.   Genitourinary:  Negative for dysuria and frequency.  Musculoskeletal:  Positive for arthralgias, joint swelling and myalgias (Bilateral hand pain).       Bilateral hands  Skin: Negative.   Allergic/Immunologic: Negative.   Neurological:  Negative for dizziness and headaches.  Psychiatric/Behavioral:  Negative for dysphoric mood. The patient is not nervous/anxious.       Objective:    Physical Exam Vitals reviewed.  Constitutional:  Appearance: Normal appearance.  Pulmonary:     Effort: Pulmonary effort is normal.  Musculoskeletal:        General: Swelling and tenderness present.     Comments: Bilateral hand tenderness, decreased ROM, and swelling  Skin:    General: Skin is warm and dry.     Capillary Refill: Capillary refill takes less than 2 seconds.  Neurological:     General: No focal deficit present.     Mental Status: He is alert and oriented to person, place, and time.  Psychiatric:        Mood and Affect: Mood normal.        Behavior: Behavior normal.    BP (!) 142/88 (BP Location: Left Arm, Patient Position: Sitting)   Pulse 82   Temp 98.2 F (36.8 C) (Temporal)   Ht 5' 11"  (1.803 m)   Wt 246 lb (111.6 kg)   SpO2 95%   BMI 34.31 kg/m  Wt Readings from Last 3 Encounters:  02/23/21 246 lb (111.6 kg)  12/15/20 243 lb (110.2 kg)  11/01/20 243 lb (110.2 kg)    Health Maintenance Due  Topic Date Due   Zoster Vaccines- Shingrix (1 of 2) Never done     Lab Results  Component Value Date   TSH 2.030 07/27/2020   Lab Results  Component Value Date   WBC 5.8 11/01/2020   HGB 15.6 11/01/2020   HCT 44.9 11/01/2020   MCV 86 11/01/2020   PLT 201 11/01/2020   Lab Results  Component Value Date   NA 139 11/01/2020   K 4.3 11/01/2020   CO2 20 11/01/2020   GLUCOSE 128 (H) 11/01/2020   BUN 15 11/01/2020   CREATININE 0.91 11/01/2020   BILITOT 0.5 11/01/2020   ALKPHOS 66 11/01/2020   AST 15  11/01/2020   ALT 18 11/01/2020   PROT 6.7 11/01/2020   ALBUMIN 4.2 11/01/2020   CALCIUM 9.3 11/01/2020   ANIONGAP 11 02/11/2020   EGFR 85 11/01/2020   Lab Results  Component Value Date   CHOL 243 (H) 11/01/2020   Lab Results  Component Value Date   HDL 36 (L) 11/01/2020   Lab Results  Component Value Date   LDLCALC 142 (H) 11/01/2020   Lab Results  Component Value Date   TRIG 354 (H) 11/01/2020   Lab Results  Component Value Date   CHOLHDL 6.8 (H) 11/01/2020   Lab Results  Component Value Date   HGBA1C 6.2 (H) 11/01/2020         Assessment & Plan:   1. Osteoarthritis of fingers of hands, bilateral - diclofenac Sodium (VOLTAREN) 1 % GEL; Apply 2 g topically 4 (four) times daily.  Dispense: 150 g; Refill: 1 - triamcinolone acetonide (KENALOG-40) injection 60 mg - ketorolac (TORADOL) injection 60 mg - ANA,IFA RA Diag Pnl w/rflx Tit/Patn - Sedimentation Rate - C-reactive protein -Take Tylenol as needed for pain -Hold Zetia  2. Mixed hyperlipidemia - icosapent Ethyl (VASCEPA) 1 g capsule; Take 2 capsules (2 g total) by mouth 2 (two) times daily.  Dispense: 120 capsule; Refill: 3 -heart healthy diet -daily physical activity  3. Hypertriglyceridemia - icosapent Ethyl (VASCEPA) 1 g capsule; Take 2 capsules (2 g total) by mouth 2 (two) times daily.  Dispense: 120 capsule; Refill: 3 -heart healthy diet -daily physical activity  4. Bilateral hand pain - ketorolac (TORADOL) injection 60 mg - ANA,IFA RA Diag Pnl w/rflx Tit/Patn - Sedimentation Rate - C-reactive protein   Apply Voltaren gel to hands  4 times daily Kenalog steroid and Toradol 60 mg injections given in office Alternate heat or ice to bilateral hands as needed for pain Hold Zetia due to hand pain Begin Vascepa twice daily for elevated cholesterol Heart healthy diet, exercise daily Follow-up as needed   Christian Lowe,Christian Lowe,acting as a scribe for [employer], NP.,have documented all relevant  documentation on the behalf of Christian Harbour, NP,as directed by  Christian Harbour, NP while in the presence of Christian Harbour, NP.    Christian Lowe, Christian Harbour, NP, have reviewed all documentation for this visit. The documentation on 02/23/21 for the exam, diagnosis, procedures, and orders are all accurate and complete.    Follow-up: PRN   Signed,  Christian Belfast, DNP

## 2021-02-23 NOTE — Patient Instructions (Addendum)
Apply Voltaren gel to hands 4 times daily Kenalog steroid and Toradol 60 mg injections given in office Alternate heat or ice to bilateral hands as needed for pain Hold Zetia due to hand pain Begin Vascepa twice daily for elevated cholesterol Heart healthy diet, exercise daily Follow-up as needed  Cholesterol Content in Foods Cholesterol is a waxy, fat-like substance that helps to carry fat in the blood. The body needs cholesterol in small amounts, but too much cholesterol can causedamage to the arteries and heart. Most people should eat less than 200 milligrams (mg) of cholesterol a day. Foods with cholesterol  Cholesterol is found in animal-based foods, such as meat, seafood, and dairy. Generally, low-fat dairy and lean meats have less cholesterol than full-fat dairy and fatty meats. The milligrams of cholesterol per serving (mg per serving) of common cholesterol-containing foods are listed below. Meat and other proteins Egg -- one large whole egg has 186 mg. Veal shank -- 4 oz has 141 mg. Lean ground [place] (93% lean) -- 4 oz has 118 mg. Fat-trimmed lamb loin -- 4 oz has 106 mg. Lean ground beef (90% lean) -- 4 oz has 100 mg. Lobster -- 3.5 oz has 90 mg. Pork loin chops -- 4 oz has 86 mg. Canned salmon -- 3.5 oz has 83 mg. Fat-trimmed beef top loin -- 4 oz has 78 mg. Frankfurter -- 1 frank (3.5 oz) has 77 mg. Crab -- 3.5 oz has 71 mg. Roasted chicken without skin, white meat -- 4 oz has 66 mg. Light bologna -- 2 oz has 45 mg. Deli-cut [place] -- 2 oz has 31 mg. Canned tuna -- 3.5 oz has 31 mg. Berniece Salines -- 1 oz has 29 mg. Oysters and mussels (raw) -- 3.5 oz has 25 mg. Mackerel -- 1 oz has 22 mg. Trout -- 1 oz has 20 mg. Pork sausage -- 1 link (1 oz) has 17 mg. Salmon -- 1 oz has 16 mg. Tilapia -- 1 oz has 14 mg. Dairy Soft-serve ice cream --  cup (4 oz) has 103 mg. Whole-milk yogurt -- 1 cup (8 oz) has 29 mg. Cheddar cheese -- 1 oz has 28 mg. American cheese -- 1 oz has 28  mg. Whole milk -- 1 cup (8 oz) has 23 mg. 2% milk -- 1 cup (8 oz) has 18 mg. Cream cheese -- 1 tablespoon (Tbsp) has 15 mg. Cottage cheese --  cup (4 oz) has 14 mg. Low-fat (1%) milk -- 1 cup (8 oz) has 10 mg. Sour cream -- 1 Tbsp has 8.5 mg. Low-fat yogurt -- 1 cup (8 oz) has 8 mg. Nonfat Greek yogurt -- 1 cup (8 oz) has 7 mg. Half-and-half cream -- 1 Tbsp has 5 mg. Fats and oils Cod liver oil -- 1 tablespoon (Tbsp) has 82 mg. Butter -- 1 Tbsp has 15 mg. Lard -- 1 Tbsp has 14 mg. Bacon grease -- 1 Tbsp has 14 mg. Mayonnaise -- 1 Tbsp has 5-10 mg. Margarine -- 1 Tbsp has 3-10 mg. Exact amounts of cholesterol in these foods may vary depending on specificingredients and brands. Foods without cholesterol Most plant-based foods do not have cholesterol unless you combine them with a food that has cholesterol. Foods without cholesterol include: Grains and cereals. Vegetables. Fruits. Vegetable oils, such as olive, canola, and sunflower oil. Legumes, such as peas, beans, and lentils. Nuts and seeds. Egg whites. Summary The body needs cholesterol in small amounts, but too much cholesterol can cause damage to the arteries and heart. Most  people should eat less than 200 milligrams (mg) of cholesterol a day. This information is not intended to replace advice given to you by your health care provider. Make sure you discuss any questions you have with your healthcare provider. Document Revised: 11/04/2019 Document Reviewed: 12/15/2019 Elsevier Patient Education  [place].  We will call you with labs Follow-up as needed   Osteoarthritis  Osteoarthritis is a type of arthritis. It refers to joint pain or joint disease. Osteoarthritis affects tissue that covers the ends of bones in joints (cartilage). Cartilage acts as a cushion between the bones and helps them move smoothly. Osteoarthritis occurs when cartilage in the joints gets worn down.Osteoarthritis is sometimes called "wear  and tear" arthritis. Osteoarthritis is the most common form of arthritis. It often occurs in older people. It is a condition that gets worse over time. The joints most often affected by this condition are in the fingers, toes, hips, knees, and spine,including the neck and lower back. What are the causes? This condition is caused by the wearing down of cartilage that covers the endsof bones. What increases the risk? The following factors may make you more likely to develop this condition: Being age 69 or older. Obesity. Overuse of joints. Past injury of a joint. Past surgery on a joint. Family history of osteoarthritis. What are the signs or symptoms? The main symptoms of this condition are pain, swelling, and stiffness in the joint. Other symptoms may include: An enlarged joint. More pain and further damage caused by small pieces of bone or cartilage that break off and float inside of the joint. Small deposits of bone (osteophytes) that grow on the edges of the joint. A grating or scraping feeling inside the joint when you move it. Popping or creaking sounds when you move. Difficulty walking or exercising. An inability to grip items, twist your hand(s), or control the movements of your hands and fingers. How is this diagnosed? This condition may be diagnosed based on: Your medical history. A physical exam. Your symptoms. X-rays of the affected joint(s). Blood tests to rule out other types of arthritis. How is this treated? There is no cure for this condition, but treatment can help control pain and improve joint function. Treatment may include a combination of therapies, such as: Pain relief techniques, such as: Applying heat and cold to the joint. Massage. A form of talk therapy called cognitive behavioral therapy (CBT). This therapy helps you set goals and follow up on the changes that you make. Medicines for pain and inflammation. The medicines can be taken by mouth or applied to  the skin. They include: NSAIDs, such as ibuprofen. Prescription medicines. Strong anti-inflammatory medicines (corticosteroids). Certain nutritional supplements. A prescribed exercise program. You may work with a physical therapist. Assistive devices, such as a brace, wrap, splint, specialized glove, or cane. A weight control plan. Surgery, such as: An osteotomy. This is done to reposition the bones and relieve pain or to remove loose pieces of bone and cartilage. Joint replacement surgery. You may need this surgery if you have advanced osteoarthritis. Follow these instructions at home: Activity Rest your affected joints as told by your health care provider. Exercise as told by your health care provider. He or she may recommend specific types of exercise, such as: Strengthening exercises. These are done to strengthen the muscles that support joints affected by arthritis. Aerobic activities. These are exercises, such as brisk walking or water aerobics, that increase your heart rate. Range-of-motion activities. These help your  joints move more easily. Balance and agility exercises. Managing pain, stiffness, and swelling     If directed, apply heat to the affected area as often as told by your health care provider. Use the heat source that your health care provider recommends, such as a moist heat pack or a heating pad. If you have a removable assistive device, remove it as told by your health care provider. Place a towel between your skin and the heat source. If your health care provider tells you to keep the assistive device on while you apply heat, place a towel between the assistive device and the heat source. Leave the heat on for 20-30 minutes. Remove the heat if your skin turns bright red. This is especially important if you are unable to feel pain, heat, or cold. You may have a greater risk of getting burned. If directed, put ice on the affected area. To do this: If you have a  removable assistive device, remove it as told by your health care provider. Put ice in a plastic bag. Place a towel between your skin and the bag. If your health care provider tells you to keep the assistive device on during icing, place a towel between the assistive device and the bag. Leave the ice on for 20 minutes, 2-3 times a day. Move your fingers or toes often to reduce stiffness and swelling. Raise (elevate) the injured area above the level of your heart while you are sitting or lying down. General instructions Take over-the-counter and prescription medicines only as told by your health care provider. Maintain a healthy weight. Follow instructions from your health care provider for weight control. Do not use any products that contain nicotine or tobacco, such as cigarettes, e-cigarettes, and chewing tobacco. If you need help quitting, ask your health care provider. Use assistive devices as told by your health care provider. Keep all follow-up visits as told by your health care provider. This is important. Where to find more information [employer] of Arthritis and Musculoskeletal and Skin Diseases: www.niams.SouthExposed.es [employer] on Aging: http://kim-miller.com/ American College of Rheumatology: www.rheumatology.org Contact a health care provider if: You have redness, swelling, or a feeling of warmth in a joint that gets worse. You have a fever along with joint or muscle aches. You develop a rash. You have trouble doing your normal activities. Get help right away if: You have pain that gets worse and is not relieved by pain medicine. Summary Osteoarthritis is a type of arthritis that affects tissue covering the ends of bones in joints (cartilage). This condition is caused by the wearing down of cartilage that covers the ends of bones. The main symptom of this condition is pain, swelling, and stiffness in the joint. There is no cure for this condition, but treatment can help  control pain and improve joint function. This information is not intended to replace advice given to you by your health care provider. Make sure you discuss any questions you have with your healthcare provider. Document Revised: 07/21/2019 Document Reviewed: 07/21/2019 Elsevier Patient Education  [place].

## 2021-02-24 ENCOUNTER — Encounter: Payer: Self-pay | Admitting: Cardiology

## 2021-02-24 ENCOUNTER — Ambulatory Visit: Payer: Medicare HMO | Admitting: Cardiology

## 2021-02-24 VITALS — BP 150/78 | HR 87 | Ht 71.0 in | Wt 247.0 lb

## 2021-02-24 DIAGNOSIS — I2510 Atherosclerotic heart disease of native coronary artery without angina pectoris: Secondary | ICD-10-CM | POA: Diagnosis not present

## 2021-02-24 DIAGNOSIS — E782 Mixed hyperlipidemia: Secondary | ICD-10-CM | POA: Diagnosis not present

## 2021-02-24 DIAGNOSIS — I2584 Coronary atherosclerosis due to calcified coronary lesion: Secondary | ICD-10-CM | POA: Diagnosis not present

## 2021-02-24 MED ORDER — NEXLETOL 180 MG PO TABS: 180.0000 mg | ORAL_TABLET | ORAL | 3 refills | Status: DC

## 2021-02-24 NOTE — Patient Instructions (Signed)
Medication Instructions:  Your physician has recommended you make the following change in your medication: START: Nexletol 180 mg take one tablet by mouth twice weekly.  *If you need a refill on your cardiac medications before your next appointment, please call your pharmacy*   Lab Work: None If you have labs (blood work) drawn today and your tests are completely normal, you will receive your results only by: [place] (if you have MyChart) OR A paper copy in the mail If you have any lab test that is abnormal or we need to change your treatment, we will call you to review the results.   Testing/Procedures: None   Follow-Up: At [facility], you and your health needs are our priority.  As part of our continuing mission to provide you with exceptional heart care, we have created designated Provider Care Teams.  These Care Teams include your primary Cardiologist (physician) and Advanced Practice Providers (APPs -  Physician Assistants and Nurse Practitioners) who all work together to provide you with the care you need, when you need it.  We recommend signing up for the patient portal called "MyChart".  Sign up information is provided on this After Visit Summary.  MyChart is used to connect with patients for Virtual Visits (Telemedicine).  Patients are able to view lab/test results, encounter notes, upcoming appointments, etc.  Non-urgent messages can be sent to your provider as well.   To learn more about what you can do with MyChart, go to [email or web address].    Your next appointment:   As needed  The format for your next appointment:   In Person  Provider:   Shirlee More, MD   Other Instructions

## 2021-02-24 NOTE — Progress Notes (Signed)
Cardiology Office Note:    Date:  02/24/2021   ID:  Christian Sensing Bishop Vanderwerf., DOB 07-09-1941, MRN [id number]  PCP:  Rochel Brome, MD  Cardiologist:  Shirlee More, MD   Referring MD: Rochel Brome, MD  ASSESSMENT:    1. Mixed hyperlipidemia   2. Coronary artery calcification    PLAN:    In order of problems listed above:  The difficult issue is how to treat this man's lipid disorder with drug intolerances and financial limitations.  Often these individuals statin intolerant will tolerate bempedoic acid I am going to place 180 mg do not take it 2 days a week and you need a follow-up lipid profile with his PCP if well-tolerated he could take it daily.  If not tolerated I think that Colestid would be the next choice.  Unfortunately in the absence of CAD he does not qualify for mRNA therapy clinical trials. He has multiple cardiovascular risk factors including age hyperlipidemia however he does not have known CAD he is not having anginal symptoms hesitant to consider doing an ischemia evaluation and I told him I think the focus here is to modify his risk initiating lipid-lowering therapy he agrees.  Next appointment as needed   Medication Adjustments/Labs and Tests Ordered: Current medicines are reviewed at length with the patient today.  Concerns regarding medicines are outlined above.  Orders Placed This Encounter  Procedures   EKG 12-Lead   Meds ordered this encounter  Medications   Bempedoic Acid (NEXLETOL) 180 MG TABS    Sig: Take 180 mg by mouth 2 (two) times a week.    Dispense:  30 tablet    Refill:  3      Chief complaint, cholesterol is high, I was refused for blood donation at the [facility] which I think contributed to this referral  History of Present Illness:    Christian Budlong. is a 80 y.o. male who is being seen today for the evaluation of aortic atherosclerosis at the request of Cox, Kirsten, MD. CT of the chest April 2021 showed three-vessel coronary  calcification and aortic atherosclerosis.  He was seen at the [facility] May 2022 CBC showed a hemoglobin of 17.2.  49 creatinine 1.05 sodium 137 potassium 4.1 GFR 71 cc/min lipid profile showed a cholesterol 213 triglycerides elevated 316 HDL 38 LDL 112.  He is on lipid-lowering therapy with Zetia and icosapent ethyl with statin intolerance.  He is a little unsure exactly what prompted his referral to me. He has severe hyperlipidemia he has been intolerant of statins he recently stopped his Zetia because of the same muscle symptoms and told me that he had financial toxicity and could not afford PCSK9 inhibitors. He has a family history of heart disease he tells me her brother had a myocardial infarction in his late 58s and another brother who had cancer from his description had constrictive pericarditis. He has never had vascular disease to his knowledge and he was unaware that he had atherosclerosis on CT scan a year ago. He has no complaints of chest pain edema shortness of breath palpitation or syncope and has had no recent imaging He thinks that refusal by [facility] to except his blood prompted his evaluation.  He is unsure of the reason why they would not except he is donated in the past. He has no known history of congenital rheumatic heart disease or atrial fibrillation He has palpitation but it is infrequent momentary not severe or sustained no syncope.  He has had no edema shortness of breath or chest pain. He has multiple cardiovascular risk factors including hypertension hyperlipidemia diabetes as well as serving in [place] with agent orange exposure during that timeframe. 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   Epidermoid cyst of skin 10/31/2020   Fatigue 11/23/2020   Gastroesophageal reflux disease 10/31/2020   GERD (gastroesophageal reflux disease)    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   Hemorrhage of gastrointestinal tract 10/31/2020   History of basal cell carcinoma (BCC)    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   Joint pain 11/23/2020   Long-term current use of testosterone replacement therapy 11/23/2020   Low libido 11/23/2020   Mixed hyperlipidemia    Osteoarthritis    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   Polyp of colon 11/01/2020   Aug 31, 2015 Entered By: Alycia Rossetti B Comment: colonoscopy 05/2015 - needs repeat ASAP   Prediabetes    SDH (subdural hematoma) ([place]) 02/26/2020   Sleep disturbance 11/23/2020   Status post craniotomy 02/10/2020   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   Urge incontinence of urine 11/23/2020   Vitamin D deficiency     Past Surgical History:  Procedure Laterality Date   CRANIOTOMY Left 02/10/2020   Procedure: CRANIOTOMY HEMATOMA EVACUATION SUBDURAL;  Surgeon: Earnie Larsson, MD;  Location: [facility];  Service: Neurosurgery;  Laterality: Left;   EYE SURGERY Bilateral 2012   cataracts w lens implants   PROSTATECTOMY  2008   BPH   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   TEMPORAL ARTERY BIOPSY / LIGATION Bilateral 1997   negative   TRANSURETHRAL RESECTION OF PROSTATE  2010    Current Medications: Current Meds  Medication Sig   Bempedoic Acid (NEXLETOL) 180 MG TABS Take 180 mg by mouth 2 (two) times a week.   cetirizine (ZYRTEC) 10 MG tablet Take 10 mg by mouth daily.   diclofenac Sodium (VOLTAREN) 1 % GEL Apply 2 g topically 4 (four) times  daily.   metFORMIN (GLUCOPHAGE) 500 MG tablet TAKE 1 TABLET (500 MG TOTAL) BY MOUTH DAILY.   naproxen (NAPROSYN) 500 MG tablet TAKE 1 TABLET (500 MG TOTAL) BY MOUTH 2 (TWO) TIMES DAILY WITH A MEAL. AS NEEDED FOR PAIN. (Patient taking differently: Take 500 mg by mouth 2 (two) times daily as needed for mild pain.)   omeprazole (PRILOSEC) 20 MG capsule Take 20 mg by mouth daily.   Probiotic Product (PROBIOTIC DAILY PO) Take 1 tablet by mouth 2 (two) times daily.   tolterodine (DETROL LA) 4 MG 24 hr capsule Take 4 mg by mouth daily.     Allergies:   Atorvastatin, Codeine, Niacin, No known allergies, Piroxicam, Pravastatin, Statins, and Zocor [simvastatin]   Social History   Socioeconomic History   Marital status: Widowed    Spouse name: Not on file   Number of children: Not on file  Years of education: Not on file   Highest education level: Not on file  Occupational History   Not on file  Tobacco Use   Smoking status: Former    Packs/day: 1.00    Years: 20.00    Pack years: 20.00    Types: Cigarettes    Quit date: 01/06/1967    Years since quitting: 54.1   Smokeless tobacco: Never  Substance and Sexual Activity   Alcohol use: Yes    Comment: occassionally   Drug use: No   Sexual activity: Not on file  Other Topics Concern   Not on file  Social History Narrative   Not on file   Social Determinants of Health   Financial Resource Strain: Not on file  Food Insecurity: Not on file  Transportation Needs: Not on file  Physical Activity: Not on file  Stress: Not on file  Social Connections: Not on file     Family History: The patient's family history includes Alcoholism in his father; Brain cancer in his mother; CAD in his brother, brother, and father; Lung cancer in his brother.  ROS:   ROS Please see the history of present illness.     All other systems reviewed and are negative.  EKGs/Labs/Other Studies Reviewed:    The following studies were reviewed today:  EKG:   EKG is sinus rhythm left axis deviation was normal ordered today.  The ekg ordered today is personally reviewed and demonstrates   Recent Labs: 07/27/2020: TSH 2.030 11/01/2020: ALT 18; BUN 15; Creatinine, Ser 0.91; Hemoglobin 15.6; Platelets 201; Potassium 4.3; Sodium 139  Recent Lipid Panel    Component Value Date/Time   CHOL 243 (H) 11/01/2020 0832   TRIG 354 (H) 11/01/2020 0832   HDL 36 (L) 11/01/2020 0832   CHOLHDL 6.8 (H) 11/01/2020 0832   LDLCALC 142 (H) 11/01/2020 5409    Physical Exam:    VS:  BP (!) 168/84 (BP Location: Right Arm, Patient Position: Sitting)   Pulse 87   Ht 5\' 11"  (1.803 m)   Wt 247 lb (112 kg)   SpO2 95%   BMI 34.45 kg/m     Wt Readings from Last 3 Encounters:  02/24/21 247 lb (112 kg)  02/23/21 246 lb (111.6 kg)  12/15/20 243 lb (110.2 kg)     GEN: Obese BMI approaches 35 well nourished, well developed in no acute distress HEENT: Normal NECK: No JVD; No carotid bruits LYMPHATICS: No lymphadenopathy CARDIAC: RRR, no murmurs, rubs, gallops RESPIRATORY:  Clear to auscultation without rales, wheezing or rhonchi  ABDOMEN: Soft, non-tender, non-distended MUSCULOSKELETAL:  No edema; No deformity  SKIN: Warm and dry NEUROLOGIC:  Alert and oriented x 3 PSYCHIATRIC:  Normal affect     Signed, Shirlee More, MD  02/24/2021 4:18 PM    [facility]

## 2021-03-01 ENCOUNTER — Telehealth: Payer: Self-pay

## 2021-03-01 DIAGNOSIS — E782 Mixed hyperlipidemia: Secondary | ICD-10-CM

## 2021-03-01 NOTE — Addendum Note (Signed)
Addended by: Resa Miner I on: 03/01/2021 04:31 PM   Modules accepted: Orders

## 2021-03-01 NOTE — Telephone Encounter (Signed)
Prior Christian Lowe was completed and denied for the patients Nexletol. Please advise from here. Is there an alternative we can provide?

## 2021-03-01 NOTE — Telephone Encounter (Signed)
Referral placed at this time

## 2021-03-03 ENCOUNTER — Encounter: Payer: Self-pay | Admitting: Nurse Practitioner

## 2021-03-03 ENCOUNTER — Ambulatory Visit (INDEPENDENT_AMBULATORY_CARE_PROVIDER_SITE_OTHER): Payer: Medicare HMO | Admitting: Nurse Practitioner

## 2021-03-03 ENCOUNTER — Ambulatory Visit: Payer: Medicare HMO | Admitting: Family Medicine

## 2021-03-03 ENCOUNTER — Other Ambulatory Visit: Payer: Self-pay

## 2021-03-03 VITALS — BP 140/82 | HR 81 | Temp 97.4°F | Ht 71.0 in | Wt 244.0 lb

## 2021-03-03 DIAGNOSIS — R7303 Prediabetes: Secondary | ICD-10-CM

## 2021-03-03 DIAGNOSIS — E782 Mixed hyperlipidemia: Secondary | ICD-10-CM

## 2021-03-03 DIAGNOSIS — E781 Pure hyperglyceridemia: Secondary | ICD-10-CM

## 2021-03-03 DIAGNOSIS — I700 Atherosclerosis of aorta: Secondary | ICD-10-CM

## 2021-03-03 MED ORDER — NEXLETOL 180 MG PO TABS: 180.0000 mg | ORAL_TABLET | ORAL | 0 refills | Status: DC

## 2021-03-03 NOTE — Patient Instructions (Addendum)
Begin Nexletol 180 mg twice weekly for high cholesterol, samples given Continue medications Fall precautions at home Recommend eye exam Follow-up 26-month, fasting  Fall Prevention in the Home, Adult Falls can cause injuries and can happen to people of all ages. There are many things you can do to make your home safe and to help prevent falls. Ask forhelp when making these changes. What actions can I take to prevent falls? General Instructions Use good lighting in all rooms. Replace any light bulbs that burn out. Turn on the lights in dark areas. Use night-lights. Keep items that you use often in easy-to-reach places. Lower the shelves around your home if needed. Set up your furniture so you have a clear path. Avoid moving your furniture around. Do not have throw rugs or other things on the floor that can make you trip. Avoid walking on wet floors. If any of your floors are uneven, fix them. Add color or contrast paint or tape to clearly mark and help you see: Grab bars or handrails. First and last steps of staircases. Where the edge of each step is. If you use a stepladder: Make sure that it is fully opened. Do not climb a closed stepladder. Make sure the sides of the stepladder are locked in place. Ask someone to hold the stepladder while you use it. Know where your pets are when moving through your home. What can I do in the bathroom?     Keep the floor dry. Clean up any water on the floor right away. Remove soap buildup in the tub or shower. Use nonskid mats or decals on the floor of the tub or shower. Attach bath mats securely with double-sided, nonslip rug tape. If you need to sit down in the shower, use a plastic, nonslip stool. Install grab bars by the toilet and in the tub and shower. Do not use towel bars as grab bars. What can I do in the bedroom? Make sure that you have a light by your bed that is easy to reach. Do not use any sheets or blankets for your bed that  hang to the floor. Have a firm chair with side arms that you can use for support when you get dressed. What can I do in the kitchen? Clean up any spills right away. If you need to reach something above you, use a step stool with a grab bar. Keep electrical cords out of the way. Do not use floor polish or wax that makes floors slippery. What can I do with my stairs? Do not leave any items on the stairs. Make sure that you have a light switch at the top and the bottom of the stairs. Make sure that there are handrails on both sides of the stairs. Fix handrails that are broken or loose. Install nonslip stair treads on all your stairs. Avoid having throw rugs at the top or bottom of the stairs. Choose a carpet that does not hide the edge of the steps on the stairs. Check carpeting to make sure that it is firmly attached to the stairs. Fix carpet that is loose or worn. What can I do on the outside of my home? Use bright outdoor lighting. Fix the edges of walkways and driveways and fix any cracks. Remove anything that might make you trip as you walk through a door, such as a raised step or threshold. Trim any bushes or trees on paths to your home. Check to see if handrails are loose or broken and  that both sides of all steps have handrails. Install guardrails along the edges of any raised decks and porches. Clear paths of anything that can make you trip, such as tools or rocks. Have leaves, snow, or ice cleared regularly. Use sand or salt on paths during winter. Clean up any spills in your garage right away. This includes grease or oil spills. What other actions can I take? Wear shoes that: Have a low heel. Do not wear high heels. Have rubber bottoms. Feel good on your feet and fit well. Are closed at the toe. Do not wear open-toe sandals. Use tools that help you move around if needed. These include: Canes. Walkers. Scooters. Crutches. Review your medicines with your doctor. Some  medicines can make you feel dizzy. This can increase your chance of falling. Ask your doctor what else you can do to help prevent falls. Where to find more information Centers for Disease Control and Prevention, [employer]: http://www.wolf.info/ National Institute on Aging: http://kim-miller.com/ Contact a doctor if: You are afraid of falling at home. You feel weak, drowsy, or dizzy at home. You fall at home. Summary There are many simple things that you can do to make your home safe and to help prevent falls. Ways to make your home safe include removing things that can make you trip and installing grab bars in the bathroom. Ask for help when making these changes in your home. This information is not intended to replace advice given to you by your health care provider. Make sure you discuss any questions you have with your healthcare provider. Document Revised: 02/25/2020 Document Reviewed: 02/25/2020 Elsevier Patient Education  2022 [place].  Preventing High Cholesterol Cholesterol is a white, waxy substance similar to fat that the human body needs to help build cells. The liver makes all the cholesterol that a person's body needs. Having high cholesterol (hypercholesterolemia) increases your risk for heart disease and stroke. Extra or excess cholesterolcomes from the food that you eat. High cholesterol can often be prevented with diet and lifestyle changes. If you already have high cholesterol, you can control it with diet, lifestyle changes,and medicines. How can high cholesterol affect me? If you have high cholesterol, fatty deposits (plaques) may build up on the walls of your blood vessels. The blood vessels that carry blood away from your heart are called arteries. Plaques make the arteries narrower and stiffer. This in turn can: Restrict or block blood flow and cause blood clots to form. Increase your risk for heart attack and stroke. What can increase my risk for high cholesterol? This condition is  more likely to develop in people who: Eat foods that are high in saturated fat or cholesterol. Saturated fat is mostly found in foods that come from animal sources. Are overweight. Are not getting enough exercise. Have a family history of high cholesterol (familial hypercholesterolemia). What actions can I take to prevent this? Nutrition  Eat less saturated fat. Avoid trans fats (partially hydrogenated oils). These are often found in margarine and in some baked goods, fried foods, and snacks bought in packages. Avoid precooked or cured meat, such as bacon, sausages, or meat loaves. Avoid foods and drinks that have added sugars. Eat more fruits, vegetables, and whole grains. Choose healthy sources of protein, such as fish, poultry, lean cuts of red meat, beans, peas, lentils, and nuts. Choose healthy sources of fat, such as: Nuts. Vegetable oils, especially olive oil. Fish that have healthy fats, such as omega-3 fatty acids. These fish include mackerel or  salmon.  Lifestyle Lose weight if you are overweight. Maintaining a healthy body mass index (BMI) can help prevent or control high cholesterol. It can also lower your risk for diabetes and high blood pressure. Ask your health care provider to help you with a diet and exercise plan to lose weight safely. Do not use any products that contain nicotine or tobacco, such as cigarettes, e-cigarettes, and chewing tobacco. If you need help quitting, ask your health care provider. Alcohol use Do not drink alcohol if: Your health care provider tells you not to drink. You are pregnant, may be pregnant, or are planning to become pregnant. If you drink alcohol: Limit how much you use to: 0-1 drink a day for women. 0-2 drinks a day for men. Be aware of how much alcohol is in your drink. In the [place] one drink equals one 12 oz bottle of beer (355 mL), one 5 oz glass of wine (148 mL), or one 1 oz glass of hard liquor (44 mL). Activity  Get enough  exercise. Do exercises as told by your health care provider. Each week, do at least 150 minutes of exercise that takes a medium level of effort (moderate-intensity exercise). This kind of exercise: Makes your heart beat faster while allowing you to still be able to talk. Can be done in short sessions several times a day or longer sessions a few times a week. For example, on 5 days each week, you could walk fast or ride your bike 3 times a day for 10 minutes each time.  Medicines Your health care provider may recommend medicines to help lower cholesterol. This may be a medicine to lower the amount of cholesterol that your liver makes. You may need medicine if: Diet and lifestyle changes have not lowered your cholesterol enough. You have high cholesterol and other risk factors for heart disease or stroke. Take over-the-counter and prescription medicines only as told by your health care provider. General information Manage your risk factors for high cholesterol. Talk with your health care provider about all your risk factors and how to lower your risk. Manage other conditions that you have, such as diabetes or high blood pressure (hypertension). Have blood tests to check your cholesterol levels at regular points in time as told by your health care provider. Keep all follow-up visits as told by your health care provider. This is important. Where to find more information American Heart Association: www.heart.org National Heart, Lung, and Blood Institute: https://wilson-eaton.com/ Summary High cholesterol increases your risk for heart disease and stroke. By keeping your cholesterol level low, you can reduce your risk for these conditions. High cholesterol can often be prevented with diet and lifestyle changes. Work with your health care provider to manage your risk factors, and have your blood tested regularly. This information is not intended to replace advice given to you by your health care provider. Make  sure you discuss any questions you have with your healthcare provider. Document Revised: 05/06/2019 Document Reviewed: 05/06/2019 Elsevier Patient Education  [place].

## 2021-03-03 NOTE — Progress Notes (Signed)
Established Patient Office Visit  Subjective:  Patient ID: Christian Sayegh., male    DOB: June 16, 1941  Age: 80 y.o. MRN: [id number]  CC: Hyperlipidemia, follow-up  HPI Christian Lowe. presents for hyperlipidemia and pre-diabetes follow-up.  Lipid/Cholesterol, Follow-up  Last lipid panel Other pertinent labs  Lab Results  Component Value Date   CHOL 243 (H) 11/01/2020   HDL 36 (L) 11/01/2020   LDLCALC 142 (H) 11/01/2020   TRIG 354 (H) 11/01/2020   CHOLHDL 6.8 (H) 11/01/2020   Lab Results  Component Value Date   ALT 18 11/01/2020   AST 15 11/01/2020   PLT 201 11/01/2020   TSH 2.030 07/27/2020     He was last seen for this 3 months ago.  Management since that visit includes Zetia but he stopped due to severe arthralgias and myalgias to bilateral hands and lower extremities. He recently began Vascepa 2 gm BID for hypertriglyceridemia.   He reports poor compliance with treatment. He is having side effects, unable to tolerate Zetia due to arthralgias  Symptoms: No chest pain No chest pressure/discomfort  No dyspnea Yes lower extremity edema  No numbness or tingling of extremity No orthopnea  No palpitations No paroxysmal nocturnal dyspnea  No speech difficulty No syncope   Current diet: in general, a "healthy" diet   Current exercise: yard work  Prediabetes, Follow-up  Lab Results  Component Value Date   HGBA1C 6.2 (H) 11/01/2020   HGBA1C 5.9 (H) 07/27/2020   HGBA1C 5.6 04/08/2020   GLUCOSE 128 (H) 11/01/2020   GLUCOSE 118 (H) 07/27/2020   GLUCOSE 118 (H) 04/08/2020    Last seen for for this3 months ago.  Management since that visit includes Metformin 500 mg. Current symptoms include none and have been stable.  Prior visit with dietician: no Current diet: in general, a "healthy" diet   Current exercise: yard work  Pertinent Labs:    Component Value Date/Time   CHOL 243 (H) 11/01/2020 0832   TRIG 354 (H) 11/01/2020 0832   CHOLHDL 6.8 (H)  11/01/2020 0832   CREATININE 0.91 11/01/2020 0832    Wt Readings from Last 3 Encounters:  03/03/21 244 lb (110.7 kg)  02/24/21 247 lb (112 kg)  02/23/21 246 lb (111.6 kg)      Past Medical History:  Diagnosis Date   Actinic keratosis 10/31/2020   Benign prostatic hyperplasia 10/31/2020   Mar 13, 2014 Entered By: Alycia Rossetti B Comment: s/p TURP 2010   Body mass index (BMI) 33.0-33.9, adult 11/23/2020   BPPV (benign paroxysmal positional vertigo)    Calculus of kidney    Calculus of kidney    Cancer ([place]) 2015   skin cancer removed off left forearm    Cataract extraction status 10/31/2020   Apr 17, 2009 Entered By: Alinda Dooms Comment: bilateral   Closed fracture of body of sternum 11/10/2019   Contusion of right thigh 11/10/2019   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   Epidermoid cyst of skin 10/31/2020   Fatigue 11/23/2020   Gastroesophageal reflux disease 10/31/2020   GERD (gastroesophageal reflux disease)    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   Hemorrhage of gastrointestinal tract 10/31/2020   History of basal cell carcinoma (BCC)    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   Joint pain 11/23/2020   Long-term current use of testosterone replacement therapy 11/23/2020   Low libido 11/23/2020   Mixed hyperlipidemia    Osteoarthritis    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   Polyp of colon 11/01/2020  Aug 31, 2015 Entered By: Alycia Rossetti B Comment: colonoscopy 05/2015 - needs repeat ASAP   Prediabetes    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   Sleep disturbance 11/23/2020   Status post craniotomy 02/10/2020   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   Urge incontinence of urine 11/23/2020   Vitamin D deficiency     Past Surgical History:  Procedure Laterality Date   CRANIOTOMY Left 02/10/2020   Procedure: CRANIOTOMY HEMATOMA EVACUATION SUBDURAL;  Surgeon: Earnie Larsson, MD;  Location: [facility];  Service: Neurosurgery;  Laterality: Left;    EYE SURGERY Bilateral 2012   cataracts w lens implants   PROSTATECTOMY  2008   BPH   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   TEMPORAL ARTERY BIOPSY / LIGATION Bilateral 1997   negative   TRANSURETHRAL RESECTION OF PROSTATE  2010    Family History  Problem Relation Age of Onset   Brain cancer Mother    CAD Father    Alcoholism Father    CAD Brother    Lung cancer Brother    CAD Brother     Social History   Socioeconomic History   Marital status: Widowed    Spouse name: Not on file   Number of children: Not on file   Years of education: Not on file   Highest education level: Not on file  Occupational History   Not on file  Tobacco Use   Smoking status: Former    Packs/day: 1.00    Years: 20.00    Pack years: 20.00    Types: Cigarettes    Quit date: 01/06/1967    Years since quitting: 54.1   Smokeless tobacco: Never  Substance and Sexual Activity   Alcohol use: Yes    Comment: occassionally   Drug use: No   Sexual activity: Not on file  Other Topics Concern   Not on file  Social History Narrative   Not on file   Social Determinants of Health   Financial Resource Strain: Not on file  Food Insecurity: Not on file  Transportation Needs: Not on file  Physical Activity: Not on file  Stress: Not on file  Social Connections: Not on file  Intimate Partner Violence: Not on file    Outpatient Medications Prior to Visit  Medication Sig Dispense Refill   cetirizine (ZYRTEC) 10 MG tablet Take 10 mg by mouth daily.     diclofenac Sodium (VOLTAREN) 1 % GEL Apply 2 g topically 4 (four) times daily. 150 g 1   icosapent Ethyl (VASCEPA) 1 g capsule Take 2 capsules (2 g total) by mouth 2 (two) times daily. 120 capsule 3   metFORMIN (GLUCOPHAGE) 500 MG tablet TAKE 1 TABLET (500 MG TOTAL) BY MOUTH DAILY. 90 tablet 0   naproxen (NAPROSYN) 500 MG tablet TAKE 1 TABLET (500 MG TOTAL) BY MOUTH 2 (TWO) TIMES DAILY WITH A MEAL. AS NEEDED FOR PAIN. (Patient taking differently: Take 500  mg by mouth 2 (two) times daily as needed for mild pain.) 60 tablet 0   omeprazole (PRILOSEC) 20 MG capsule Take 20 mg by mouth daily.     Probiotic Product (PROBIOTIC DAILY PO) Take 1 tablet by mouth 2 (two) times daily.     tadalafil (CIALIS) 10 MG tablet      tolterodine (DETROL LA) 4 MG 24 hr capsule Take 4 mg by mouth daily.     Bempedoic Acid (NEXLETOL) 180 MG TABS Take 180 mg by mouth 2 (two) times a week. (  Patient not taking: Reported on 03/03/2021) 30 tablet 3   ezetimibe (ZETIA) 10 MG tablet TAKE 1 TABLET BY MOUTH EVERY DAY (Patient not taking: No sig reported) 90 tablet 0   Carboxymethylcellulose Sodium 0.25 % SOLN INSTILL ONE DROP IN [facility] EYE THREE TIMES A DAY AS NEEDED (Patient not taking: Reported on 02/24/2021)     oxybutynin (DITROPAN-XL) 10 MG 24 hr tablet Take 10 mg by mouth daily. (Patient not taking: Reported on 02/24/2021)     triamcinolone (KENALOG) 0.1 % Apply topically 2 (two) times daily. (Patient not taking: Reported on 02/24/2021)     No facility-administered medications prior to visit.    Allergies  Allergen Reactions   Atorvastatin     Other reaction(s): Muscle pain   Codeine Other (See Comments)   Niacin Itching    Other reaction(s): Itching, Flushing   No Known Allergies     Other reaction(s): Dizziness   Piroxicam     Other reaction(s): Gastric ulcer with hemorrhage   Pravastatin     Other reaction(s): Muscle pain   Statins     myalgias   Zocor [Simvastatin]     Other reaction(s): Muscle pain    ROS Review of Systems  Constitutional:  Negative for appetite change, fatigue and fever.  HENT:  Negative for congestion, ear pain and sore throat.   Eyes: Negative.   Respiratory:  Negative for cough and shortness of breath.   Cardiovascular:  Positive for leg swelling (Bilateral). Negative for chest pain.  Gastrointestinal:  Negative for abdominal pain, constipation, diarrhea, nausea and vomiting.  Endocrine: Negative.   Genitourinary:  Negative for  dysuria and frequency.  Musculoskeletal:  Positive for arthralgias (bilateral hands). Negative for myalgias.  Skin: Negative.   Allergic/Immunologic: Negative.   Neurological:  Negative for dizziness and headaches.  Psychiatric/Behavioral:  Negative for dysphoric mood. The patient is not nervous/anxious.      Objective:    Physical Exam Vitals reviewed.  Constitutional:      Appearance: Normal appearance. He is well-developed.  HENT:     Right Ear: Tympanic membrane normal.     Left Ear: Tympanic membrane normal.     Mouth/Throat:     Mouth: Mucous membranes are moist.  Eyes:     Pupils: Pupils are equal, round, and reactive to light.  Cardiovascular:     Rate and Rhythm: Normal rate and regular rhythm.     Pulses: Normal pulses.     Heart sounds: Normal heart sounds.  Pulmonary:     Effort: Pulmonary effort is normal. No respiratory distress.     Breath sounds: Normal breath sounds.  Abdominal:     General: Bowel sounds are normal.     Palpations: Abdomen is soft.  Musculoskeletal:        General: Swelling and tenderness present.     Cervical back: Neck supple.     Comments: Swelling and tenderness to bilateral hands R>L  Skin:    General: Skin is warm and dry.     Capillary Refill: Capillary refill takes less than 2 seconds.  Neurological:     General: No focal deficit present.     Mental Status: He is alert and oriented to person, place, and time.  Psychiatric:        Mood and Affect: Mood normal.        Behavior: Behavior normal.    BP 140/82 (BP Location: Left Arm, Patient Position: Sitting)   Pulse 81   Temp (!) 97.4 F (36.3  C) (Temporal)   Ht _0  (1.803 m)   Wt 244 lb (110.7 kg)   SpO2 97%   BMI 34.03 kg/m  Wt Readings from Last 3 Encounters:  03/03/21 244 lb (110.7 kg)  02/24/21 247 lb (112 kg)  02/23/21 246 lb (111.6 kg)     Health Maintenance Due  Topic Date Due   Zoster Vaccines- Shingrix (1 of 2) Never done    Lab Results  Component  Value Date   TSH 2.030 07/27/2020   Lab Results  Component Value Date   WBC 5.8 11/01/2020   HGB 15.6 11/01/2020   HCT 44.9 11/01/2020   MCV 86 11/01/2020   PLT 201 11/01/2020   Lab Results  Component Value Date   NA 139 11/01/2020   K 4.3 11/01/2020   CO2 20 11/01/2020   GLUCOSE 128 (H) 11/01/2020   BUN 15 11/01/2020   CREATININE 0.91 11/01/2020   BILITOT 0.5 11/01/2020   ALKPHOS 66 11/01/2020   AST 15 11/01/2020   ALT 18 11/01/2020   PROT 6.7 11/01/2020   ALBUMIN 4.2 11/01/2020   CALCIUM 9.3 11/01/2020   ANIONGAP 11 02/11/2020   EGFR 85 11/01/2020   Lab Results  Component Value Date   CHOL 243 (H) 11/01/2020   Lab Results  Component Value Date   HDL 36 (L) 11/01/2020   Lab Results  Component Value Date   LDLCALC 142 (H) 11/01/2020   Lab Results  Component Value Date   TRIG 354 (H) 11/01/2020   Lab Results  Component Value Date   CHOLHDL 6.8 (H) 11/01/2020   Lab Results  Component Value Date   HGBA1C 6.2 (H) 11/01/2020      Assessment & Plan:   1. Mixed hyperlipidemia-not at goal - CBC with Differential/Platelet - Comprehensive metabolic panel - Lipid panel - Bempedoic Acid (NEXLETOL) 180 MG TABS; Take 180 mg by mouth 2 (two) times a week.  Dispense: 28 tablet; Refill: 0  2. Hypertriglyceridemia-not at goal - CBC with Differential/Platelet - Comprehensive metabolic panel - Lipid panel - Bempedoic Acid (NEXLETOL) 180 MG TABS; Take 180 mg by mouth 2 (two) times a week.  Dispense: 28 tablet; Refill: 0 -Continue Vascepa 2 gm BID   3. Atherosclerosis of aorta (HCC) - CBC with Differential/Platelet - Comprehensive metabolic panel - Lipid panel - Bempedoic Acid (NEXLETOL) 180 MG TABS; Take 180 mg by mouth 2 (two) times a week.  Dispense: 28 tablet; Refill: 0 -Heart healthy diet -Increase physical activity  4. Prediabetes - Hemoglobin A1c  -Continue Metformin 500 mg daily  Begin Nexletol 180 mg twice weekly for high cholesterol, samples  given Continue medications Fall precautions at home Recommend eye exam Follow-up 52-month, fasting  Follow-up:  352-month  Christian Lowe,Christian Lowe,acting as a scEducation administratoror ShCIT GroupNP.,have documented all relevant documentation on the behalf of Christian Lowe,as directed by  Christian Lowe while in the presence of Christian Lowe.   Christian Lowe, Christian Lowe, have reviewed all documentation for this visit. The documentation on 03/03/21 for the exam, diagnosis, procedures, and orders are all accurate and complete.   Signed, Christian BelfastDNP 03/03/21 at 9:16

## 2021-03-04 LAB — COMPREHENSIVE METABOLIC PANEL
ALT: 9 IU/L (ref 0–44)
AST: 11 IU/L (ref 0–40)
Albumin/Globulin Ratio: 1.7 (ref 1.2–2.2)
Albumin: 4.3 g/dL (ref 3.6–4.6)
Alkaline Phosphatase: 58 IU/L (ref 44–121)
BUN/Creatinine Ratio: 16 (ref 10–24)
BUN: 17 mg/dL (ref 8–27)
Bilirubin Total: 0.7 mg/dL (ref 0.0–1.2)
CO2: 22 mmol/L (ref 20–29)
Calcium: 9.3 mg/dL (ref 8.6–10.2)
Chloride: 101 mmol/L (ref 96–106)
Creatinine, Ser: 1.04 mg/dL (ref 0.76–1.27)
Globulin, Total: 2.6 g/dL (ref 1.5–4.5)
Glucose: 114 mg/dL — ABNORMAL HIGH (ref 65–99)
Potassium: 4.4 mmol/L (ref 3.5–5.2)
Sodium: 138 mmol/L (ref 134–144)
Total Protein: 6.9 g/dL (ref 6.0–8.5)
eGFR: 72 mL/min/{1.73_m2} (ref >59)

## 2021-03-04 LAB — CBC WITH DIFFERENTIAL/PLATELET
Basophils Absolute: 0.1 10*3/uL (ref 0.0–0.2)
Basos: 1 %
EOS (ABSOLUTE): 0.2 10*3/uL (ref 0.0–0.4)
Eos: 4 %
Hematocrit: 51.3 % — ABNORMAL HIGH (ref 37.5–51.0)
Hemoglobin: 16.9 g/dL (ref 13.0–17.7)
Immature Grans (Abs): 0 10*3/uL (ref 0.0–0.1)
Immature Granulocytes: 0 %
Lymphocytes Absolute: 1.2 10*3/uL (ref 0.7–3.1)
Lymphs: 24 %
MCH: 28.2 pg (ref 26.6–33.0)
MCHC: 32.9 g/dL (ref 31.5–35.7)
MCV: 86 fL (ref 79–97)
Monocytes Absolute: 0.6 10*3/uL (ref 0.1–0.9)
Monocytes: 11 %
Neutrophils Absolute: 3.1 10*3/uL (ref 1.4–7.0)
Neutrophils: 60 %
Platelets: 224 10*3/uL (ref 150–450)
RBC: 5.99 x10E6/uL — ABNORMAL HIGH (ref 4.14–5.80)
RDW: 12.5 % (ref 11.6–15.4)
WBC: 5.1 10*3/uL (ref 3.4–10.8)

## 2021-03-04 LAB — LIPID PANEL
Chol/HDL Ratio: 6.1 ratio — ABNORMAL HIGH (ref 0.0–5.0)
Cholesterol, Total: 231 mg/dL — ABNORMAL HIGH (ref 100–199)
HDL: 38 mg/dL — ABNORMAL LOW (ref >39)
LDL Chol Calc (NIH): 159 mg/dL — ABNORMAL HIGH (ref 0–99)
Triglycerides: 186 mg/dL — ABNORMAL HIGH (ref 0–149)
VLDL Cholesterol Cal: 34 mg/dL (ref 5–40)

## 2021-03-04 LAB — ANA,IFA RA DIAG PNL W/RFLX TIT/PATN
ANA Titer 1: NEGATIVE
Cyclic Citrullin Peptide Ab: 62 units — ABNORMAL HIGH (ref 0–19)
Rheumatoid fact SerPl-aCnc: <10 IU/mL (ref <14.0)

## 2021-03-04 LAB — SEDIMENTATION RATE: Sed Rate: 19 mm/hr (ref 0–30)

## 2021-03-04 LAB — HEMOGLOBIN A1C
Est. average glucose Bld gHb Est-mCnc: 123 mg/dL
Hgb A1c MFr Bld: 5.9 % — ABNORMAL HIGH (ref 4.8–5.6)

## 2021-03-04 LAB — CARDIOVASCULAR RISK ASSESSMENT

## 2021-03-04 LAB — C-REACTIVE PROTEIN: CRP: 2 mg/L (ref 0–10)

## 2021-03-17 DIAGNOSIS — H16203 Unspecified keratoconjunctivitis, bilateral: Secondary | ICD-10-CM | POA: Diagnosis not present

## 2021-03-18 DIAGNOSIS — H16109 Unspecified superficial keratitis, unspecified eye: Secondary | ICD-10-CM | POA: Diagnosis not present

## 2021-03-22 ENCOUNTER — Telehealth: Payer: Self-pay | Admitting: Cardiology

## 2021-03-22 ENCOUNTER — Ambulatory Visit (INDEPENDENT_AMBULATORY_CARE_PROVIDER_SITE_OTHER): Payer: Medicare HMO | Admitting: Pharmacist

## 2021-03-22 ENCOUNTER — Other Ambulatory Visit: Payer: Self-pay

## 2021-03-22 ENCOUNTER — Encounter: Payer: Self-pay | Admitting: Pharmacist

## 2021-03-22 DIAGNOSIS — H903 Sensorineural hearing loss, bilateral: Secondary | ICD-10-CM

## 2021-03-22 DIAGNOSIS — E782 Mixed hyperlipidemia: Secondary | ICD-10-CM

## 2021-03-22 DIAGNOSIS — Z461 Encounter for fitting and adjustment of hearing aid: Secondary | ICD-10-CM

## 2021-03-22 HISTORY — DX: Encounter for fitting and adjustment of hearing aid: Z46.1

## 2021-03-22 HISTORY — DX: Sensorineural hearing loss, bilateral: H90.3

## 2021-03-22 MED ORDER — PRALUENT 75 MG/ML ~~LOC~~ SOAJ: 1.0000 mL | SUBCUTANEOUS | 11 refills | Status: DC

## 2021-03-22 NOTE — Patient Instructions (Addendum)
Patient assistance information will be signed and faxed to my Praluent PAP program

## 2021-03-22 NOTE — Telephone Encounter (Signed)
New Message:    Need a new prescription for Praluent. It must be hand signed for signature and hand signed also  for the date  with the primary diagnosis on it please. Please fax to [phone]

## 2021-03-22 NOTE — Progress Notes (Signed)
Patient ID: Christian Lowe.                 DOB: 24-Jul-1941                    MRN: [id number]     HPI: Carmeron Lowe. is a 80 y.o. male patient referred to lipid clinic by Dr Bettina Gavia. PMH is significant for HLD, pre DM, and history of TBI.  Patient is followed by [facility] and wa referred to [facility] by Dr. Rochel Brome after a history of statin intolerance.  Patient has tried and failed atorvastatin, pravastatin, simvastatin, Niacin, and Zetia.  Was prescribed Praluent one year ago but could not afford .  At last visit with Dr Bettina Gavia, patient was placed on Nexletol '180mg'$  twice a week using sample medications.  Patient presents today in good spirits.  Would like to achieve LDL goal but is limited by intolerances and finances.  Is tolerating Nexletol '180mg'$  twice weekly without any adverse effects.   Current Medications: Nexletol '180mg'$  twice weekly, Vascepa 2g BID Intolerances: simvastatin, atorvastatin, pravastatin, ezetimibe, niacin Risk Factors: HTN, age, family history LDL goal: <70  Labs: TC 231, Trigs 186, HDL 38, LDL 159 (on vascepa - 03/03/21)  Past Medical History:  Diagnosis Date   Actinic keratosis 10/31/2020   Benign prostatic hyperplasia 10/31/2020   Mar 13, 2014 Entered By: Alycia Rossetti B Comment: s/p TURP 2010   Body mass index (BMI) 33.0-33.9, adult 11/23/2020   BPPV (benign paroxysmal positional vertigo)    Calculus of kidney    Calculus of kidney    Cancer ([place]) 2015   skin cancer removed off left forearm    Cataract extraction status 10/31/2020   Apr 17, 2009 Entered By: Alinda Dooms Comment: bilateral   Closed fracture of body of sternum 11/10/2019   Contusion of right thigh 11/10/2019   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   Epidermoid cyst of skin 10/31/2020   Fatigue 11/23/2020   Gastroesophageal reflux disease 10/31/2020   GERD (gastroesophageal reflux disease)    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   Hemorrhage of gastrointestinal tract 10/31/2020    History of basal cell carcinoma (BCC)    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   Joint pain 11/23/2020   Long-term current use of testosterone replacement therapy 11/23/2020   Low libido 11/23/2020   Mixed hyperlipidemia    Osteoarthritis    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   Polyp of colon 11/01/2020   Aug 31, 2015 Entered By: Alycia Rossetti B Comment: colonoscopy 05/2015 - needs repeat ASAP   Prediabetes    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 28 tablet 0   cetirizine (ZYRTEC) 10 MG tablet Take 10 mg by mouth daily.     diclofenac Sodium (VOLTAREN) 1 % GEL Apply 2 g topically 4 (four) times daily. 150 g 1   icosapent Ethyl (VASCEPA) 1 g capsule Take 2 capsules (2 g total) by mouth 2 (two) times daily. 120 capsule 3   metFORMIN (GLUCOPHAGE) 500 MG tablet TAKE 1 TABLET (500 MG TOTAL) BY MOUTH DAILY. 90 tablet 0  naproxen (NAPROSYN) 500 MG tablet TAKE 1 TABLET (500 MG TOTAL) BY MOUTH 2 (TWO) TIMES DAILY WITH A MEAL. AS NEEDED FOR PAIN. (Patient taking differently: Take 500 mg by mouth 2 (two) times daily as needed for mild pain.) 60 tablet 0   omeprazole (PRILOSEC) 20 MG capsule Take 20 mg by mouth daily.     Probiotic Product (PROBIOTIC DAILY PO) Take 1 tablet by mouth 2 (two) times daily.     tadalafil (CIALIS) 10 MG tablet      tolterodine (DETROL LA) 4 MG 24 hr capsule Take 4 mg by mouth daily.     No current facility-administered medications on file prior to visit.    Allergies  Allergen Reactions   Atorvastatin     Other reaction(s): Muscle pain   Codeine Other (See Comments)   Niacin Itching     Other reaction(s): Itching, Flushing   No Known Allergies     Other reaction(s): Dizziness   Piroxicam     Other reaction(s): Gastric ulcer with hemorrhage   Pravastatin     Other reaction(s): Muscle pain   Statins     myalgias   Zocor [Simvastatin]     Other reaction(s): Muscle pain    Assessment/Plan:  1. Hyperlipidemia - Patient recent LDL 159 above goal of <70.  Is tolerating Nexletol twice weekly but would benefit greatly from [facility].  Patient's plan prefers Praluent.  Completed patient assistance application for patient and educated patient in room how to administer.  Patient voiced he remembered how to administer from last time it was prescribed to him.  Will send to [facility] for Dr Oren Binet signature and then send to praluent patient assistance program.  Continue Vascepa 2g BID Continue Nexletol '180mg'$  2x weekly Start Praluent '75mg'$  every 2 weeks if approved for patient assistance  Karren Cobble, PharmD, BCACP, [place], [place] [id number] [place],  [address] Phone: [phone]; Fax: [phone] 03/22/2021 12:31 PM

## 2021-03-24 NOTE — Telephone Encounter (Signed)
Patient assistance form completed and emailed to Resa Miner RN for Dr Oren Binet signature

## 2021-03-30 ENCOUNTER — Telehealth: Payer: Self-pay | Admitting: Cardiology

## 2021-03-30 DIAGNOSIS — E782 Mixed hyperlipidemia: Secondary | ICD-10-CM

## 2021-03-30 MED ORDER — PRALUENT 75 MG/ML ~~LOC~~ SOAJ: 1.0000 mL | SUBCUTANEOUS | 11 refills | Status: DC

## 2021-03-30 NOTE — Telephone Encounter (Signed)
Called to verify the correct information was filled out on the form.

## 2021-03-30 NOTE — Telephone Encounter (Signed)
Pt c/o medication issue:  1. Name of Medication: Alirocumab ([place]) 75 MG/ML SOAJ  2. How are you currently taking this medication (dosage and times per day)? Inject 1 mL into the skin every 14 (fourteen) days.  3. Are you having a reaction (difficulty breathing--STAT)? no  4. What is your medication issue? [facility]Praluent is wanting a hand written/signed rx for the pt please advise

## 2021-03-30 NOTE — Telephone Encounter (Signed)
Form faxed to [phone]

## 2021-04-25 NOTE — Telephone Encounter (Signed)
WAS THIS SENT FOR PT ASSISTANCE I STILL HAVE IT PENDING IN MY FILING SYSTEM. ROUTING TO [place].

## 2021-05-05 DIAGNOSIS — L821 Other seborrheic keratosis: Secondary | ICD-10-CM | POA: Diagnosis not present

## 2021-05-05 DIAGNOSIS — D045 Carcinoma in situ of skin of trunk: Secondary | ICD-10-CM | POA: Diagnosis not present

## 2021-05-20 ENCOUNTER — Other Ambulatory Visit: Payer: Self-pay | Admitting: Physician Assistant

## 2021-05-23 NOTE — Telephone Encounter (Signed)
Called and spoke w/pt and stated that we need the pharmacy spendout for the year and the minimum required to get praluent free from the [facility] is $500 per household. Pt voiced understanding.

## 2021-06-09 ENCOUNTER — Other Ambulatory Visit: Payer: Self-pay

## 2021-06-09 ENCOUNTER — Encounter: Payer: Self-pay | Admitting: Family Medicine

## 2021-06-09 ENCOUNTER — Ambulatory Visit (INDEPENDENT_AMBULATORY_CARE_PROVIDER_SITE_OTHER): Payer: Medicare HMO | Admitting: Family Medicine

## 2021-06-09 VITALS — BP 156/74 | HR 80 | Temp 97.8°F | Resp 18 | Ht 71.0 in | Wt 243.0 lb

## 2021-06-09 DIAGNOSIS — I10 Essential (primary) hypertension: Secondary | ICD-10-CM | POA: Diagnosis not present

## 2021-06-09 DIAGNOSIS — M79641 Pain in right hand: Secondary | ICD-10-CM

## 2021-06-09 DIAGNOSIS — Z23 Encounter for immunization: Secondary | ICD-10-CM

## 2021-06-09 DIAGNOSIS — G720 Drug-induced myopathy: Secondary | ICD-10-CM | POA: Diagnosis not present

## 2021-06-09 DIAGNOSIS — E6609 Other obesity due to excess calories: Secondary | ICD-10-CM | POA: Diagnosis not present

## 2021-06-09 DIAGNOSIS — R7303 Prediabetes: Secondary | ICD-10-CM

## 2021-06-09 DIAGNOSIS — J410 Simple chronic bronchitis: Secondary | ICD-10-CM

## 2021-06-09 DIAGNOSIS — E782 Mixed hyperlipidemia: Secondary | ICD-10-CM

## 2021-06-09 DIAGNOSIS — K219 Gastro-esophageal reflux disease without esophagitis: Secondary | ICD-10-CM | POA: Diagnosis not present

## 2021-06-09 DIAGNOSIS — Z6833 Body mass index (BMI) 33.0-33.9, adult: Secondary | ICD-10-CM | POA: Diagnosis not present

## 2021-06-09 MED ORDER — LOSARTAN POTASSIUM 50 MG PO TABS: 50.0000 mg | ORAL_TABLET | Freq: Every day | ORAL | 0 refills | Status: DC

## 2021-06-09 MED ORDER — BREZTRI AEROSPHERE 160-9-4.8 MCG/ACT IN AERO: 2.0000 | INHALATION_SPRAY | Freq: Two times a day (BID) | RESPIRATORY_TRACT | 3 refills | Status: DC

## 2021-06-09 NOTE — Patient Instructions (Addendum)
Start on losartan 50 mg once daily for high bp.  Start on breztri 2 puffs twice a day (sample given and prescription sent. I am going to reach out to our pharmacist and see how they are doing with your medicines.

## 2021-06-09 NOTE — Progress Notes (Signed)
Subjective:  Patient ID: Christian Deem., male    DOB: March 15, 1941  Age: 80 y.o. MRN: [id number]  Chief Complaint  Patient presents with   Hyperlipidemia    HPI Complaining of cough for 3 days. Had a gathering on Monday night. No chills, sweats, fever, cp. Little sob.  Hyperlipidemia: Last cholesterol LDL 159, trigs 186. Taking otc fish oil. Unable to afford praluent. [facility] pharmacy trying to get his praluent less but he has not heard from the.  Taking fish oil OTC 2 capsules daily (vascepa too expensive.)  GERD: on omeprazole.  BPH detrol LA.  Prediabetes: on metformin 500 mg once daily.  Pt has issue with rt hand over the last few months. His 3rd-5th digits of his right hand have limited flexion. It hurts when he tries to make a full fist.   Current Outpatient Medications on File Prior to Visit  Medication Sig Dispense Refill   cetirizine (ZYRTEC) 10 MG tablet Take 10 mg by mouth daily.     diclofenac Sodium (VOLTAREN) 1 % GEL Apply 2 g topically 4 (four) times daily. 150 g 1   metFORMIN (GLUCOPHAGE) 500 MG tablet TAKE 1 TABLET (500 MG TOTAL) BY MOUTH DAILY. 90 tablet 0   naproxen (NAPROSYN) 500 MG tablet TAKE 1 TABLET (500 MG TOTAL) BY MOUTH 2 (TWO) TIMES DAILY WITH A MEAL. AS NEEDED FOR PAIN. (Patient taking differently: Take 500 mg by mouth 2 (two) times daily as needed for mild pain.) 60 tablet 0   omeprazole (PRILOSEC) 20 MG capsule Take 20 mg by mouth daily.     Probiotic Product (PROBIOTIC DAILY PO) Take 1 tablet by mouth 2 (two) times daily.     tadalafil (CIALIS) 10 MG tablet      tolterodine (DETROL LA) 4 MG 24 hr capsule Take 4 mg by mouth daily.     No current facility-administered medications on file prior to visit.   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   Epidermoid cyst of skin 10/31/2020   Fatigue 11/23/2020   Gastroesophageal reflux disease 10/31/2020   GERD (gastroesophageal reflux disease)    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   Hemorrhage of gastrointestinal tract 10/31/2020   History of basal cell carcinoma (BCC)    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   Joint pain 11/23/2020   Long-term current use of testosterone replacement therapy 11/23/2020   Low libido 11/23/2020   Mixed hyperlipidemia    Osteoarthritis    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   Polyp of colon 11/01/2020   Aug 31, 2015 Entered By: Alycia Rossetti B Comment: colonoscopy 05/2015 - needs repeat ASAP   Prediabetes    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   Sleep disturbance 11/23/2020   Status post craniotomy 02/10/2020   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   Urge incontinence of urine 11/23/2020   Vitamin D  deficiency    Past Surgical History:  Procedure Laterality Date   CRANIOTOMY Left 02/10/2020   Procedure: CRANIOTOMY HEMATOMA EVACUATION SUBDURAL;  Surgeon: Earnie Larsson, MD;  Location: [facility];  Service: Neurosurgery;  Laterality: Left;   EYE SURGERY Bilateral 2012   cataracts w lens implants   PROSTATECTOMY  2008   BPH   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   TEMPORAL ARTERY BIOPSY / LIGATION Bilateral 1997   negative   TRANSURETHRAL RESECTION OF PROSTATE  2010    Family History  Problem Relation Age of Onset   Brain cancer Mother    CAD Father    Alcoholism Father    CAD Brother    Lung cancer Brother    CAD Brother    Social History    Socioeconomic History   Marital status: Widowed    Spouse name: Not on file   Number of children: Not on file   Years of education: Not on file   Highest education level: Not on file  Occupational History   Not on file  Tobacco Use   Smoking status: Former    Packs/day: 1.00    Years: 20.00    Pack years: 20.00    Types: Cigarettes    Quit date: 01/06/1967    Years since quitting: 54.4   Smokeless tobacco: Never  Substance and Sexual Activity   Alcohol use: Yes    Comment: occassionally   Drug use: No   Sexual activity: Not on file  Other Topics Concern   Not on file  Social History Narrative   Not on file   Social Determinants of Health   Financial Resource Strain: Not on file  Food Insecurity: Not on file  Transportation Needs: Not on file  Physical Activity: Not on file  Stress: Not on file  Social Connections: Not on file    Review of Systems  Constitutional:  Negative for chills and fever.  HENT:  Positive for congestion. Negative for rhinorrhea and sore throat.   Respiratory:  Positive for cough and shortness of breath.   Cardiovascular:  Negative for chest pain and palpitations.  Gastrointestinal:  Negative for abdominal pain, constipation, diarrhea, nausea and vomiting.  Genitourinary:  Negative for dysuria and urgency.  Musculoskeletal:  Negative for arthralgias, back pain and myalgias.  Neurological:  Negative for dizziness and headaches.  Psychiatric/Behavioral:  Negative for dysphoric mood. The patient is not nervous/anxious.     Objective:  BP (!) 156/74   Pulse 80   Temp 97.8 F (36.6 C)   Resp 18   Ht 5\' 11"  (1.803 m)   Wt 243 lb (110.2 kg)   BMI 33.89 kg/m   BP/Weight 06/09/2021 03/03/2021 11/23/6220  Systolic BP 979 892 119  Diastolic BP 74 82 78  Wt. (Lbs) 243 244 247  BMI 33.89 34.03 34.45    Physical Exam Constitutional:      Appearance: Normal appearance.  HENT:     Right Ear: Tympanic membrane, ear canal and external ear  normal.     Left Ear: Tympanic membrane, ear canal and external ear normal.     Nose: Nose normal. No congestion or rhinorrhea.     Mouth/Throat:     Mouth: Mucous membranes are moist.     Pharynx: No oropharyngeal exudate or posterior oropharyngeal erythema.  Cardiovascular:     Rate and Rhythm: Normal rate and regular rhythm.     Heart sounds: Normal heart sounds.  Pulmonary:     Effort: Pulmonary effort  is normal. No respiratory distress.     Breath sounds: Normal breath sounds. No wheezing, rhonchi or rales.  Abdominal:     General: Bowel sounds are normal.     Palpations: Abdomen is soft.     Tenderness: There is no abdominal tenderness.  Musculoskeletal:     Comments: Rt hand: unable to flex 3rd through 5th digit due to pain. Full extension is normal. Not trigger fingers.  Lymphadenopathy:     Cervical: No cervical adenopathy.  Neurological:     Mental Status: He is alert.  Psychiatric:        Mood and Affect: Mood normal.        Behavior: Behavior normal.    Diabetic Foot Exam - Simple   No data filed      Lab Results  Component Value Date   WBC 5.1 03/03/2021   HGB 16.9 03/03/2021   HCT 51.3 (H) 03/03/2021   PLT 224 03/03/2021   GLUCOSE 114 (H) 03/03/2021   CHOL 231 (H) 03/03/2021   TRIG 186 (H) 03/03/2021   HDL 38 (L) 03/03/2021   LDLCALC 159 (H) 03/03/2021   ALT 9 03/03/2021   AST 11 03/03/2021   NA 138 03/03/2021   K 4.4 03/03/2021   CL 101 03/03/2021   CREATININE 1.04 03/03/2021   BUN 17 03/03/2021   CO2 22 03/03/2021   TSH 2.030 07/27/2020   INR 1.1 02/10/2020   HGBA1C 5.9 (H) 03/03/2021      Assessment & Plan:   Problem List Items Addressed This Visit       Cardiovascular and Mediastinum   Essential hypertension, benign    Uncontrolled. Start on losartan 50 mg once daily.       Relevant Medications   losartan (COZAAR) 50 MG tablet   Other Relevant Orders   CBC with Differential/Platelet   Comprehensive metabolic panel      Respiratory   Simple chronic bronchitis ([place])    Start back on breztri 2 puffs bid.         Digestive   Gastroesophageal reflux disease    The current medical regimen is effective;  continue present plan and medications. Continue omeprazole 20 mg once daily         Musculoskeletal and Integument   Drug-induced myopathy    Intolerant to statins.        Other   Mixed hyperlipidemia - Primary    Check lipid panel.  Recommend continue to work on eating healthy diet and exercise. Sent message to pharmacy about praluent.      Relevant Medications   losartan (COZAAR) 50 MG tablet   Other Relevant Orders   Lipid panel   Prediabetes    Recommend continue to work on eating healthy diet and exercise. Check labs.      Relevant Orders   Hemoglobin A1c   Need for immunization against influenza   Relevant Orders   Flu Vaccine QUAD High Dose(Fluad) (Completed)   Class 1 obesity due to excess calories with serious comorbidity and body mass index (BMI) of 33.0 to 33.9 in adult    Recommend continue to work on eating healthy diet and exercise.       Hand pain, right    Unable to fully flex right 3rd - 5th digits.  Recommend refer to hand surgeon     .  Meds ordered this encounter  Medications   losartan (COZAAR) 50 MG tablet    Sig: Take 1 tablet (50 mg total) by  mouth daily.    Dispense:  90 tablet    Refill:  0   Budeson-Glycopyrrol-Formoterol (BREZTRI AEROSPHERE) 160-9-4.8 MCG/ACT AERO    Sig: Inhale 2 puffs into the lungs in the morning and at bedtime.    Dispense:  10.7 g    Refill:  3     Orders Placed This Encounter  Procedures   Flu Vaccine QUAD High Dose(Fluad)   CBC with Differential/Platelet   Comprehensive metabolic panel   Hemoglobin A1c   Lipid panel      Follow-up: Return in about 4 weeks (around 07/07/2021) for chronic follow up for hypertension, .  An After Visit Summary was printed and given to the patient.  Rochel Brome, MD [facility] [phone]

## 2021-06-10 ENCOUNTER — Encounter: Payer: Self-pay | Admitting: Family Medicine

## 2021-06-10 ENCOUNTER — Telehealth: Payer: Self-pay | Admitting: Family Medicine

## 2021-06-10 DIAGNOSIS — J449 Chronic obstructive pulmonary disease, unspecified: Secondary | ICD-10-CM | POA: Insufficient documentation

## 2021-06-10 DIAGNOSIS — E66812 Obesity, class 2: Secondary | ICD-10-CM

## 2021-06-10 DIAGNOSIS — E6609 Other obesity due to excess calories: Secondary | ICD-10-CM | POA: Insufficient documentation

## 2021-06-10 DIAGNOSIS — Z23 Encounter for immunization: Secondary | ICD-10-CM

## 2021-06-10 DIAGNOSIS — I10 Essential (primary) hypertension: Secondary | ICD-10-CM | POA: Insufficient documentation

## 2021-06-10 DIAGNOSIS — M79642 Pain in left hand: Secondary | ICD-10-CM | POA: Insufficient documentation

## 2021-06-10 DIAGNOSIS — Z6835 Body mass index (BMI) 35.0-35.9, adult: Secondary | ICD-10-CM | POA: Insufficient documentation

## 2021-06-10 DIAGNOSIS — M79641 Pain in right hand: Secondary | ICD-10-CM | POA: Insufficient documentation

## 2021-06-10 DIAGNOSIS — J441 Chronic obstructive pulmonary disease with (acute) exacerbation: Secondary | ICD-10-CM | POA: Insufficient documentation

## 2021-06-10 DIAGNOSIS — J410 Simple chronic bronchitis: Secondary | ICD-10-CM | POA: Insufficient documentation

## 2021-06-10 DIAGNOSIS — G720 Drug-induced myopathy: Secondary | ICD-10-CM | POA: Insufficient documentation

## 2021-06-10 HISTORY — DX: Chronic obstructive pulmonary disease, unspecified: J44.9

## 2021-06-10 HISTORY — DX: Encounter for immunization: Z23

## 2021-06-10 HISTORY — DX: Pain in right hand: M79.641

## 2021-06-10 HISTORY — DX: Obesity, class 2: E66.812

## 2021-06-10 HISTORY — DX: Morbid (severe) obesity due to excess calories: E66.01

## 2021-06-10 LAB — CBC WITH DIFFERENTIAL/PLATELET
Basophils Absolute: 0.1 10*3/uL (ref 0.0–0.2)
Basos: 1 %
EOS (ABSOLUTE): 0.3 10*3/uL (ref 0.0–0.4)
Eos: 5 %
Hematocrit: 49.1 % (ref 37.5–51.0)
Hemoglobin: 16.6 g/dL (ref 13.0–17.7)
Immature Grans (Abs): 0 10*3/uL (ref 0.0–0.1)
Immature Granulocytes: 0 %
Lymphocytes Absolute: 1.2 10*3/uL (ref 0.7–3.1)
Lymphs: 16 %
MCH: 29.8 pg (ref 26.6–33.0)
MCHC: 33.8 g/dL (ref 31.5–35.7)
MCV: 88 fL (ref 79–97)
Monocytes Absolute: 0.7 10*3/uL (ref 0.1–0.9)
Monocytes: 9 %
Neutrophils Absolute: 5.1 10*3/uL (ref 1.4–7.0)
Neutrophils: 69 %
Platelets: 207 10*3/uL (ref 150–450)
RBC: 5.57 x10E6/uL (ref 4.14–5.80)
RDW: 15.2 % (ref 11.6–15.4)
WBC: 7.4 10*3/uL (ref 3.4–10.8)

## 2021-06-10 LAB — LIPID PANEL
Chol/HDL Ratio: 7.1 ratio — ABNORMAL HIGH (ref 0.0–5.0)
Cholesterol, Total: 240 mg/dL — ABNORMAL HIGH (ref 100–199)
HDL: 34 mg/dL — ABNORMAL LOW (ref >39)
LDL Chol Calc (NIH): 165 mg/dL — ABNORMAL HIGH (ref 0–99)
Triglycerides: 217 mg/dL — ABNORMAL HIGH (ref 0–149)
VLDL Cholesterol Cal: 41 mg/dL — ABNORMAL HIGH (ref 5–40)

## 2021-06-10 LAB — COMPREHENSIVE METABOLIC PANEL
ALT: 11 IU/L (ref 0–44)
AST: 13 IU/L (ref 0–40)
Albumin/Globulin Ratio: 1.8 (ref 1.2–2.2)
Albumin: 4.3 g/dL (ref 3.6–4.6)
Alkaline Phosphatase: 66 IU/L (ref 44–121)
BUN/Creatinine Ratio: 14 (ref 10–24)
BUN: 15 mg/dL (ref 8–27)
Bilirubin Total: 0.8 mg/dL (ref 0.0–1.2)
CO2: 25 mmol/L (ref 20–29)
Calcium: 9.1 mg/dL (ref 8.6–10.2)
Chloride: 102 mmol/L (ref 96–106)
Creatinine, Ser: 1.05 mg/dL (ref 0.76–1.27)
Globulin, Total: 2.4 g/dL (ref 1.5–4.5)
Glucose: 115 mg/dL — ABNORMAL HIGH (ref 70–99)
Potassium: 4.5 mmol/L (ref 3.5–5.2)
Sodium: 141 mmol/L (ref 134–144)
Total Protein: 6.7 g/dL (ref 6.0–8.5)
eGFR: 71 mL/min/{1.73_m2} (ref >59)

## 2021-06-10 LAB — HEMOGLOBIN A1C
Est. average glucose Bld gHb Est-mCnc: 111 mg/dL
Hgb A1c MFr Bld: 5.5 % (ref 4.8–5.6)

## 2021-06-10 LAB — CARDIOVASCULAR RISK ASSESSMENT

## 2021-06-10 NOTE — Assessment & Plan Note (Addendum)
>>  ASSESSMENT AND PLAN FOR DIABETIC NEPHROPATHY (HCC) WRITTEN ON 06/10/2021 12:06 AM BY Sircharles Holzheimer, MD  Check lipid panel.  Recommend continue to work on eating healthy diet and exercise. Sent message to pharmacy about praluent.  >>ASSESSMENT AND PLAN FOR PREDIABETES WRITTEN ON 06/10/2021 12:06 AM BY Shandiin Eisenbeis, MD  Recommend continue to work on eating healthy diet and exercise. Check labs.

## 2021-06-10 NOTE — Chronic Care Management (AMB) (Signed)
  Chronic Care Management   Note  06/10/2021 Name: Christian Lowe. MRN: [id number] DOB: Oct 07, 1940  Christian Lowe. is a 80 y.o. year old male who is a primary care patient of Cox, Kirsten, MD. I reached out to [employer]. by phone today in response to a referral sent by [name]'s PCP, Cox, Kirsten, MD.   [name] was given information about Chronic Care Management services today including:  CCM service includes personalized support from designated clinical staff supervised by his physician, including individualized plan of care and coordination with other care providers 24/7 contact phone numbers for assistance for urgent and routine care needs. Service will only be billed when office clinical staff spend 20 minutes or more in a month to coordinate care. Only one practitioner may furnish and bill the service in a calendar month. The patient may stop CCM services at any time (effective at the end of the month) by phone call to the office staff.   Patient agreed to services and verbal consent obtained.   Follow up plan:   Tatjana Secretary/administrator

## 2021-06-10 NOTE — Chronic Care Management (AMB) (Signed)
  Chronic Care Management   Outreach Note  06/10/2021 Name: Christian Lowe. MRN: [id number] DOB: 06-Jun-1941  Referred by: Rochel Brome, MD Reason for referral : No chief complaint on file.   An unsuccessful telephone outreach was attempted today. The patient was referred to the pharmacist for assistance with care management and care coordination.   Follow Up Plan:   Tatjana Dellinger Upstream Scheduler

## 2021-06-10 NOTE — Assessment & Plan Note (Signed)
Start back on breztri 2 puffs bid.

## 2021-06-10 NOTE — Assessment & Plan Note (Signed)
The current medical regimen is effective;  continue present plan and medications. Continue omeprazole 20 mg once daily.  

## 2021-06-10 NOTE — Assessment & Plan Note (Signed)
Recommend continue to work on eating healthy diet and exercise.  

## 2021-06-10 NOTE — Assessment & Plan Note (Signed)
Uncontrolled. Start on losartan 50 mg once daily.

## 2021-06-10 NOTE — Assessment & Plan Note (Signed)
Unable to fully flex right 3rd - 5th digits.  Recommend refer to hand surgeon

## 2021-06-10 NOTE — Assessment & Plan Note (Signed)
Intolerant to statins. 

## 2021-06-10 NOTE — Assessment & Plan Note (Signed)
Recommend continue to work on eating healthy diet and exercise. Check labs 

## 2021-06-10 NOTE — Assessment & Plan Note (Signed)
>>  ASSESSMENT AND PLAN FOR SIMPLE CHRONIC BRONCHITIS ([place]) WRITTEN ON 06/10/2021 12:05 AM BY COX, KIRSTEN, MD  Start back on breztri 2 puffs bid.

## 2021-06-11 ENCOUNTER — Other Ambulatory Visit: Payer: Self-pay | Admitting: Family Medicine

## 2021-06-11 MED ORDER — PRALUENT 75 MG/ML ~~LOC~~ SOAJ: 75.0000 mL | SUBCUTANEOUS | 0 refills | Status: DC

## 2021-06-11 NOTE — Progress Notes (Signed)
Blood count normal.  Liver function normal.  Kidney function normal.  Thyroid function normal.  Cholesterol: trigs elevated to trigs, hdl 34, LDL 165. Start on praluent 75 mg every 2 weeks. HBA1C: great!

## 2021-06-13 ENCOUNTER — Other Ambulatory Visit: Payer: Self-pay

## 2021-06-13 ENCOUNTER — Telehealth: Payer: Self-pay

## 2021-06-13 NOTE — Chronic Care Management (AMB) (Signed)
Chronic Care Management Pharmacy Assistant   Name: Christian Lowe.  MRN: [id number] DOB: 02/11/1941   Reason for Encounter: Chart Prep for initial visit with CPP on 06/21/21   Conditions to be addressed/monitored: HTN, GERD, and Osteoarthritis, Hearing Loss, Insulin Resistance, Cancer, Mixed Hyperlipidemia, Prediabetes, ED, Fatigue, Sleep Distrubance, Obesity, benign prostatic hyperplasia  Recent office visits:  06/11/21 Orders Only. Ordered Praluent 75 mg/ml,. Inject 75 mls into skin every 14 days.  06/09/21 Christian Lowe. Seen for routine visit. Started Breztri 160-9-4.8 MCG/ACT 2 puffs 2 times daily. Started on Losartan Potassium 50 mg daily. D/C Praluent 75mg /ml due to cost. D/C Nexletol 180 mg 2 times weekly due to cost. D/C Vascepa 2 g 2 times daily due to cost.   03/03/21 Christian Belfast NP. Seen for routine visit.  Started  Nexletol 180 mg 2 times weekly. D/C Ezetimibe 10 mg  daily. Pt reported not taking Oxybutynin Chloride 10 mg daily. Completed course of Kenalog 0.1% 2 times daily.   02/23/21 Christian Belfast NP. Seen for Osteoarthritis of hands. Started Diclofenac Sodium 2 g 4 times daily. Started Vascepa 2 g 2 times daily. Increased Tadalafil from 5 mg to 10 mg. Ordered Kenalog 0.1 % cream 2 times daily. D/C Toviaz 8 mg daily due to change in therapy.   12/15/20 Christian Lowe. Seen for Back Pain. Completed course of Amoxicillin 500 mg.   Recent consult visits:  06/09/21 (Christian Lowe) [place], Christian Lowe. Notes attached. No med changes.  03/30/21 (Cardiology) Telephone encounter. Re-ordered Praluent 1 mg every 14 days.   03/22/21 (Cardiology) Christian Lowe [facility]. Seen for HLD. Started Praluent 1 ml every 14 days.   [facility]. Seen for Urinary Incontinence. Notes state he is on tolterodine for urge incontinence, which is not working well. He tried  Oxybutynin in the past,but stopped due to cost of the medication. Veteran agreed to continue Tolterodine. Will add  Flomax,advised to watch for dizziness and low BP.  03/01/21 (Cardiology) Referral made to Advanced lipid disorders clinic.   02/24/21 (Cardiology) Christian Lowe. Seen for HLD. Started on Nexletol 180 mg 2 times weekly.   12/22/20 (Christian Lowe) Christian Lowe. Seen for HLD. No med changes.  Hospital visits:  None in previous 6 months  Medications: Outpatient Encounter Medications as of 06/13/2021  Medication Sig   Alirocumab (PRALUENT) 75 MG/ML SOAJ Inject 75 mLs into the skin every 14 (fourteen) days.   anastrozole (ARIMIDEX) 1 MG tablet SMARTSIG:0.5 Tablet(s) By Mouth Once a Week   BESIVANCE 0.6 % SUSP Place 1 drop into the right eye every 2 (two) hours.   Budeson-Glycopyrrol-Formoterol (BREZTRI AEROSPHERE) 160-9-4.8 MCG/ACT AERO Inhale 2 puffs into the lungs in the morning and at bedtime.   cetirizine (ZYRTEC) 10 MG tablet Take 10 mg by mouth daily.   diclofenac Sodium (VOLTAREN) 1 % GEL Apply 2 g topically 4 (four) times daily.   losartan (COZAAR) 50 MG tablet Take 1 tablet (50 mg total) by mouth daily.   metFORMIN (GLUCOPHAGE) 500 MG tablet TAKE 1 TABLET (500 MG TOTAL) BY MOUTH DAILY.   naproxen (NAPROSYN) 500 MG tablet TAKE 1 TABLET (500 MG TOTAL) BY MOUTH 2 (TWO) TIMES DAILY WITH A MEAL. AS NEEDED FOR PAIN. (Patient taking differently: Take 500 mg by mouth 2 (two) times daily as needed for mild pain.)   omeprazole (PRILOSEC) 20 MG capsule Take 20 mg by mouth daily.   Probiotic Product (PROBIOTIC DAILY PO) Take 1 tablet by mouth 2 (two) times daily.  tadalafil (CIALIS) 10 MG tablet    tamsulosin (FLOMAX) 0.4 MG CAPS capsule TAKE ONE CAPSULE BY MOUTH TWICE A DAY :  PLEASE WATCH FOR DIZZINESS AND LOW BLOOD PRESSURE   tolterodine (DETROL LA) 4 MG 24 hr capsule Take 4 mg by mouth daily.   No facility-administered encounter medications on file as of 06/13/2021.    Lab Results  Component Value Date/Time   HGBA1C 5.5 06/09/2021 08:33 AM   HGBA1C 5.9 (H) 03/03/2021 09:35 AM     BP Readings  from Last 3 Encounters:  06/09/21 (!) 156/74  03/03/21 140/82  02/24/21 (!) 150/78    Patient Questions:   Have you seen any other providers since your last visit with PCP? Pt stated no other providers  Any changes in your medications or health? Pt stated he has bronchitis and stated the provider did not give him any meds. Pt stated his coughing and its keeping him up at night and stated its getting a little better.   Any side effects from any medications? No side effects the pt is aware of   Do you have an symptoms or problems not managed by your medications? Pt stated no other problems  Any concerns about your health right now? No  Has your provider asked that you check blood pressure, blood sugar, or follow special diet at home? Pt stated he does not have a way to check his BP at home but he has not been asked too.   Do you get any type of exercise on a regular basis? Pt does a lot of yard work  Can you think of a goal you would like to reach for your health? Pt wants to live to be 80 years old he stated  Do you have any problems getting your medications? Pt stated no issues   Is there anything that you would like to discuss during the appointment? Pt has nothing else to add   [name] was reminded to have all medications, supplements and any blood glucose and blood pressure readings available for review with Arizona Constable, Pharm. D, at his telephone visit on 06/21/21 at 11:00 am .    Star Rating Drugs:  Medication:  Last Fill: Day Supply Losartan   06/09/21 90ds Metformin   05/20/21 90ds   Care Gaps: Last annual wellness visit? None noted  If applicable: Last eye exam / retinopathy screening? None noted  Last diabetic foot exam? None noted    Christian Lowe, Christian Lowe Pharmacist Assistant  [phone]

## 2021-06-14 ENCOUNTER — Other Ambulatory Visit: Payer: Self-pay

## 2021-06-14 ENCOUNTER — Telehealth: Payer: Self-pay

## 2021-06-14 ENCOUNTER — Other Ambulatory Visit: Payer: Self-pay | Admitting: Family Medicine

## 2021-06-14 DIAGNOSIS — M21941 Unspecified acquired deformity of hand, right hand: Secondary | ICD-10-CM

## 2021-06-14 MED ORDER — HYDROCODONE BIT-HOMATROP MBR 5-1.5 MG/5ML PO SOLN: 5.0000 mL | Freq: Four times a day (QID) | ORAL | 0 refills | Status: DC | PRN

## 2021-06-14 NOTE — Telephone Encounter (Signed)
Patient called stated he came in 11/03 and was seen he had a cough and congestion, wanted to know if something could be called in for him. Unable to sleep due to cough and congestion. Please advise.

## 2021-06-15 ENCOUNTER — Other Ambulatory Visit: Payer: Self-pay | Admitting: Family Medicine

## 2021-06-16 NOTE — Telephone Encounter (Signed)
Called and lvm for pt to call the [employer] directly at [phone] to apply for a grant to cover the cost of the medication and if they have issues they can reach out to me.

## 2021-06-20 ENCOUNTER — Telehealth: Payer: Self-pay

## 2021-06-20 NOTE — Chronic Care Management (AMB) (Signed)
I called and reminded pt of CPP telephone appt tomorrow. Pt confirmed.  [name], [place] Pharmacist Assistant  [phone]

## 2021-06-21 ENCOUNTER — Telehealth: Payer: Self-pay | Admitting: Family Medicine

## 2021-06-21 ENCOUNTER — Telehealth: Payer: Medicare HMO

## 2021-06-21 ENCOUNTER — Telehealth: Payer: Self-pay

## 2021-06-21 NOTE — Telephone Encounter (Signed)
  Care Management   Follow Up Note   06/21/2021 Name: Montario Zilka. MRN: [id number] DOB: 1940/10/13   Referred by: Rochel Brome, MD Reason for referral : Chronic Care Management   An unsuccessful telephone outreach was attempted today. The patient was referred to the case management team for assistance with care management and care coordination.   Follow Up Plan: We have been unable to make contact with the patient for follow up. The care management team is available to follow up with the patient after provider conversation with the patient regarding recommendation for care management engagement and subsequent re-referral to the care management team.    [place] Constable, Pharm.D. - [phone]

## 2021-06-22 ENCOUNTER — Telehealth: Payer: Self-pay

## 2021-06-22 NOTE — Progress Notes (Signed)
Pt has been r/s with CPP on 06/28/21 @ 10. Pt stated his wife turned off rings for unknown callers and stated he did try calling back for his appt. He stated he fixed his phone where it wont do that next time.  [name], [place] Pharmacist Assistant  [phone]

## 2021-06-23 ENCOUNTER — Telehealth: Payer: Self-pay

## 2021-06-23 NOTE — Telephone Encounter (Signed)
Pt called this morning stating that he needs to reschedule his phone appointment with Ovid Curd. I have canceled the appointment on 06/28/2021. Please have someone to call him back to get this rescheduled. Thank you

## 2021-06-28 ENCOUNTER — Telehealth: Payer: Medicare HMO

## 2021-07-08 ENCOUNTER — Other Ambulatory Visit: Payer: Self-pay

## 2021-07-08 ENCOUNTER — Ambulatory Visit (INDEPENDENT_AMBULATORY_CARE_PROVIDER_SITE_OTHER): Payer: Medicare HMO | Admitting: Family Medicine

## 2021-07-08 VITALS — BP 136/76 | HR 76 | Temp 98.8°F | Resp 16 | Ht 71.0 in | Wt 239.0 lb

## 2021-07-08 DIAGNOSIS — E1169 Type 2 diabetes mellitus with other specified complication: Secondary | ICD-10-CM

## 2021-07-08 DIAGNOSIS — I700 Atherosclerosis of aorta: Secondary | ICD-10-CM | POA: Diagnosis not present

## 2021-07-08 DIAGNOSIS — I10 Essential (primary) hypertension: Secondary | ICD-10-CM

## 2021-07-08 DIAGNOSIS — J449 Chronic obstructive pulmonary disease, unspecified: Secondary | ICD-10-CM

## 2021-07-08 DIAGNOSIS — E785 Hyperlipidemia, unspecified: Secondary | ICD-10-CM | POA: Diagnosis not present

## 2021-07-08 NOTE — Progress Notes (Addendum)
Subjective:  Patient ID: Christian Deem., male    DOB: 02/23/1941  Age: 80 y.o. MRN: [id number]  Chief Complaint  Patient presents with   Hyperlipidemia   Hypertension    HPI Hypertension: bp improved on losartan 50 mg once daily. Started one month ago.  COPD: started on breztri 2 puffs bid at last visit which has helped her breathing.  Hyperlipidemia: recommended to start praluent. Pt is at high risk for CAD.  Current Outpatient Medications on File Prior to Visit  Medication Sig Dispense Refill   Alirocumab (PRALUENT) 75 MG/ML SOAJ Inject 75 mLs into the skin every 14 (fourteen) days. 6 mL 0   Budeson-Glycopyrrol-Formoterol (BREZTRI AEROSPHERE) 160-9-4.8 MCG/ACT AERO Inhale 2 puffs into the lungs in the morning and at bedtime. 10.7 g 3   cetirizine (ZYRTEC) 10 MG tablet Take 10 mg by mouth daily.     ezetimibe (ZETIA) 10 MG tablet Take 10 mg by mouth daily.     losartan (COZAAR) 50 MG tablet Take 1 tablet (50 mg total) by mouth daily. 90 tablet 0   metFORMIN (GLUCOPHAGE) 500 MG tablet TAKE 1 TABLET (500 MG TOTAL) BY MOUTH DAILY. 90 tablet 0   naproxen (NAPROSYN) 500 MG tablet TAKE 1 TABLET (500 MG TOTAL) BY MOUTH 2 (TWO) TIMES DAILY WITH A MEAL. AS NEEDED FOR PAIN. (Patient taking differently: Take 500 mg by mouth 2 (two) times daily as needed for mild pain.) 60 tablet 0   Omega-3 Fatty Acids (FISH OIL) 1000 MG CPDR Take 1,000 mg by mouth in the morning and at bedtime.     omeprazole (PRILOSEC) 20 MG capsule Take 20 mg by mouth daily.     Probiotic Product (PROBIOTIC DAILY PO) Take 1 tablet by mouth 2 (two) times daily.     tadalafil (CIALIS) 10 MG tablet      tamsulosin (FLOMAX) 0.4 MG CAPS capsule TAKE ONE CAPSULE BY MOUTH TWICE A DAY :  PLEASE WATCH FOR DIZZINESS AND LOW BLOOD PRESSURE     tolterodine (DETROL LA) 4 MG 24 hr capsule Take 4 mg by mouth daily.     No current facility-administered medications on file prior to visit.   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   Epidermoid cyst of skin 10/31/2020   Fatigue 11/23/2020   Gastroesophageal reflux disease 10/31/2020   GERD (gastroesophageal reflux disease)    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   Hemorrhage of gastrointestinal tract 10/31/2020   History of basal cell carcinoma (BCC)    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   Joint pain 11/23/2020   Long-term current use of testosterone replacement therapy 11/23/2020   Low libido 11/23/2020   Mixed hyperlipidemia    Osteoarthritis    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   Polyp of colon 11/01/2020   Aug 31, 2015 Entered By: Alycia Rossetti B Comment: colonoscopy 05/2015 - needs repeat ASAP  Prediabetes    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   Sleep disturbance 11/23/2020   Status post craniotomy 02/10/2020   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   Urge incontinence of urine 11/23/2020   Vitamin D deficiency    Past Surgical History:  Procedure Laterality Date   CRANIOTOMY Left 02/10/2020   Procedure: CRANIOTOMY HEMATOMA EVACUATION SUBDURAL;  Surgeon: Earnie Larsson, MD;  Location: [facility];  Service: Neurosurgery;  Laterality: Left;   EYE SURGERY Bilateral 2012   cataracts w lens implants   PROSTATECTOMY  2008   BPH   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   TEMPORAL ARTERY BIOPSY / LIGATION Bilateral 1997   negative   TRANSURETHRAL RESECTION OF PROSTATE  2010     Family History  Problem Relation Age of Onset   Brain cancer Mother    CAD Father    Alcoholism Father    CAD Brother    Lung cancer Brother    CAD Brother    Social History   Socioeconomic History   Marital status: Widowed    Spouse name: Not on file   Number of children: Not on file   Years of education: Not on file   Highest education level: Not on file  Occupational History   Not on file  Tobacco Use   Smoking status: Former    Packs/day: 1.00    Years: 20.00    Pack years: 20.00    Types: Cigarettes    Quit date: 01/06/1967    Years since quitting: 54.5   Smokeless tobacco: Never  Substance and Sexual Activity   Alcohol use: Yes    Comment: occassionally   Drug use: No   Sexual activity: Not on file  Other Topics Concern   Not on file  Social History Narrative   Not on file   Social Determinants of Health   Financial Resource Strain: Not on file  Food Insecurity: Not on file  Transportation Needs: Not on file  Physical Activity: Not on file  Stress: Not on file  Social Connections: Not on file    Review of Systems  Constitutional:  Positive for fatigue. Negative for chills and fever.  HENT:  Negative for congestion, rhinorrhea and sore throat.   Respiratory:  Positive for cough. Negative for shortness of breath.   Cardiovascular:  Negative for chest pain and palpitations.  Gastrointestinal:  Negative for abdominal pain, constipation, diarrhea, nausea and vomiting.  Genitourinary:  Negative for dysuria and urgency.       Poor bladder control  Musculoskeletal:  Negative for arthralgias, back pain and myalgias.  Neurological:  Positive for dizziness (little). Negative for headaches.  Psychiatric/Behavioral:  Negative for dysphoric mood. The patient is not nervous/anxious.     Objective:  BP 136/76   Pulse 76   Temp 98.8 F (37.1 C)   Resp 16   Ht 5\' 11"  (1.803 m)   Wt 239 lb (108.4 kg)   BMI 33.33 kg/m   BP/Weight 07/08/2021 06/09/2021  1/61/0960  Systolic BP 454 098 119  Diastolic BP 76 74 82  Wt. (Lbs) 239 243 244  BMI 33.33 33.89 34.03    Physical Exam Vitals reviewed.  Constitutional:      Appearance: Normal appearance.  Cardiovascular:     Rate and Rhythm: Normal rate and regular rhythm.     Heart sounds: Normal heart sounds.  Pulmonary:     Effort: Pulmonary effort is normal.     Breath sounds: Normal breath  sounds. No wheezing, rhonchi or rales.  Neurological:     Mental Status: He is alert and oriented to person, place, and time.  Psychiatric:        Mood and Affect: Mood normal.        Behavior: Behavior normal.    Diabetic Foot Exam - Simple   No data filed      Lab Results  Component Value Date   WBC 7.4 06/09/2021   HGB 16.6 06/09/2021   HCT 49.1 06/09/2021   PLT 207 06/09/2021   GLUCOSE 115 (H) 06/09/2021   CHOL 240 (H) 06/09/2021   TRIG 217 (H) 06/09/2021   HDL 34 (L) 06/09/2021   LDLCALC 165 (H) 06/09/2021   ALT 11 06/09/2021   AST 13 06/09/2021   NA 141 06/09/2021   K 4.5 06/09/2021   CL 102 06/09/2021   CREATININE 1.05 06/09/2021   BUN 15 06/09/2021   CO2 25 06/09/2021   TSH 2.030 07/27/2020   INR 1.1 02/10/2020   HGBA1C 5.5 06/09/2021      Assessment & Plan:   Problem List Items Addressed This Visit       Cardiovascular and Mediastinum   Essential hypertension, benign    The current medical regimen is effective;  continue present plan and medications.       Relevant Medications   ezetimibe (ZETIA) 10 MG tablet   Aortic atherosclerosis ([place]) - Primary    Rx: praluent.  Recommend continue to work on eating healthy diet and exercise.       Relevant Medications   ezetimibe (ZETIA) 10 MG tablet     Respiratory   COPD mixed type (HCC)    Continue breztri 2 puffs bid.          Endocrine   Hyperlipidemia associated with type 2 diabetes mellitus ([place])    Continue zetia 10 mg daily.  Continue praluent.  Recommend continue to work on eating healthy diet  and exercise.       Relevant Medications   ezetimibe (ZETIA) 10 MG tablet  . Follow-up: Return in about 2 months (around 09/12/2021) for chronic fasting.  An After Visit Summary was printed and given to the patient.  Rochel Brome, MD [facility] [phone]

## 2021-07-10 ENCOUNTER — Encounter: Payer: Self-pay | Admitting: Family Medicine

## 2021-07-10 DIAGNOSIS — J449 Chronic obstructive pulmonary disease, unspecified: Secondary | ICD-10-CM | POA: Insufficient documentation

## 2021-07-10 DIAGNOSIS — I700 Atherosclerosis of aorta: Secondary | ICD-10-CM | POA: Insufficient documentation

## 2021-07-10 HISTORY — DX: Atherosclerosis of aorta: I70.0

## 2021-07-10 NOTE — Assessment & Plan Note (Signed)
Continue breztri 2 puffs bid.

## 2021-07-10 NOTE — Assessment & Plan Note (Addendum)
Continue zetia 10 mg daily.  Continue praluent.  Recommend continue to work on eating healthy diet and exercise.

## 2021-07-10 NOTE — Assessment & Plan Note (Signed)
Rx: praluent.  Recommend continue to work on eating healthy diet and exercise.

## 2021-07-10 NOTE — Assessment & Plan Note (Signed)
The current medical regimen is effective;  continue present plan and medications.  

## 2021-08-05 NOTE — Telephone Encounter (Signed)
Called and spoke w/pt and stated that they were approved for [facility] and the information was emailed to the pt. Advised the pt to complete fasting labs.  Pharmacy Card CARD NO. [id number]   CARD STATUS Active   BIN [id number]   PCN [id number]   PC GROUP [id number]   HELP DESK [phone]   PROVIDER PDMI   PROCESSOR

## 2021-08-05 NOTE — Addendum Note (Signed)
Addended by: Allean Found on: 08/05/2021 03:05 PM   Modules accepted: Orders

## 2021-08-06 ENCOUNTER — Other Ambulatory Visit: Payer: Self-pay | Admitting: Family Medicine

## 2021-08-09 DIAGNOSIS — E782 Mixed hyperlipidemia: Secondary | ICD-10-CM | POA: Diagnosis not present

## 2021-08-09 LAB — LIPID PANEL
Chol/HDL Ratio: 1.7 ratio (ref 0.0–5.0)
Cholesterol, Total: 92 mg/dL — ABNORMAL LOW (ref 100–199)
HDL: 53 mg/dL (ref >39)
LDL Chol Calc (NIH): 19 mg/dL (ref 0–99)
Triglycerides: 111 mg/dL (ref 0–149)
VLDL Cholesterol Cal: 20 mg/dL (ref 5–40)

## 2021-08-10 ENCOUNTER — Telehealth: Payer: Self-pay

## 2021-08-10 NOTE — Telephone Encounter (Signed)
-----   Message from Richardo Priest, MD sent at 08/09/2021  5:08 PM EST ----- Great result he can discontinue Zetia

## 2021-08-10 NOTE — Telephone Encounter (Signed)
Spoke with patient regarding results and recommendation.  Patient verbalizes understanding and is agreeable to plan of care. Advised patient to call back with any issues or concerns.  

## 2021-08-23 NOTE — Telephone Encounter (Signed)
ERROR

## 2021-09-04 ENCOUNTER — Other Ambulatory Visit: Payer: Self-pay | Admitting: Family Medicine

## 2021-09-07 ENCOUNTER — Other Ambulatory Visit: Payer: Self-pay | Admitting: Family Medicine

## 2021-09-08 NOTE — Progress Notes (Signed)
Subjective:  Patient ID: [facility]., male    DOB: Jul 14, 1941  Age: 81 y.o. MRN: [id number]  Chief Complaint  Patient presents with   Hyperlipidemia   Hypertension   HPI: Hyperlipidemia: Current medications: Zetia 10mg  1 tablet daily, crestor 40 mg daily, Fish oil 1000mg  1 capsule TWICE DAILY, Praluent injection 75 mg/ml once every 2 weeks.   Hypertension: Current medications: Losartan 50mg  once daily. It is unclear if he is taking 50 or 100 mg. Patient has seen the [place].   COPD: Current medications: Breztri 2 puffs TWICE DAILY   Aortic Atherosclerosis: Current medications: Praluent injection 75mg /ml once every two weeks, crestor 40 mg daily ,and zetia 10 mg daily. Also on fish oi..  Diet: healthy. Exercise: no   Current Outpatient Medications on File Prior to Visit  Medication Sig Dispense Refill   fluticasone-salmeterol (WIXELA INHUB) 250-50 MCG/ACT AEPB Inhale 1 puff into the lungs in the morning and at bedtime.     loratadine (CLARITIN) 10 MG tablet TAKE ONE TABLET BY MOUTH DAILY FOR RHINITIS     losartan (COZAAR) 100 MG tablet TAKE ONE-HALF TABLET BY MOUTH DAILY FOR BLOOD PRESSURE     rosuvastatin (CRESTOR) 40 MG tablet TAKE ONE-HALF TABLET BY MOUTH ONCE A DAY FOR CHOLESTEROL     ezetimibe (ZETIA) 10 MG tablet Take 10 mg by mouth daily.     losartan (COZAAR) 50 MG tablet TAKE 1 TABLET BY MOUTH EVERY DAY 90 tablet 0   metFORMIN (GLUCOPHAGE) 500 MG tablet TAKE 1 TABLET (500 MG TOTAL) BY MOUTH DAILY. 90 tablet 0   naproxen (NAPROSYN) 500 MG tablet TAKE 1 TABLET (500 MG TOTAL) BY MOUTH 2 (TWO) TIMES DAILY WITH A MEAL. AS NEEDED FOR PAIN. (Patient taking differently: Take 500 mg by mouth 2 (two) times daily as needed for mild pain.) 60 tablet 0   Omega-3 Fatty Acids (FISH OIL) 1000 MG CPDR Take 1,000 mg by mouth in the morning and at bedtime.     omeprazole (PRILOSEC) 20 MG capsule Take 20 mg by mouth daily.     PRALUENT 75 MG/ML SOAJ INJECT 75 MLS INTO THE SKIN EVERY  14 (FOURTEEN) DAYS. 6 mL 0   Probiotic Product (PROBIOTIC DAILY PO) Take 1 tablet by mouth 2 (two) times daily.     tadalafil (CIALIS) 10 MG tablet      tamsulosin (FLOMAX) 0.4 MG CAPS capsule TAKE ONE CAPSULE BY MOUTH TWICE A DAY :  PLEASE WATCH FOR DIZZINESS AND LOW BLOOD PRESSURE     tolterodine (DETROL LA) 4 MG 24 hr capsule Take 4 mg by mouth daily.     No current facility-administered medications on file prior to visit.   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   Epidermoid cyst of skin 10/31/2020   Fatigue 11/23/2020   Gastroesophageal reflux disease 10/31/2020   GERD (gastroesophageal reflux disease)  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   Hemorrhage of gastrointestinal tract 10/31/2020   History of basal cell carcinoma (BCC)    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   Joint pain 11/23/2020   Long-term current use of testosterone replacement therapy 11/23/2020   Low libido 11/23/2020   Mixed hyperlipidemia    Osteoarthritis    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   Polyp of colon 11/01/2020   Aug 31, 2015 Entered By: Alycia Rossetti B Comment: colonoscopy 05/2015 - needs repeat ASAP   Prediabetes    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   Sleep disturbance 11/23/2020   Status post craniotomy 02/10/2020   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   Urge incontinence of urine 11/23/2020    Vitamin D deficiency    Past Surgical History:  Procedure Laterality Date   CRANIOTOMY Left 02/10/2020   Procedure: CRANIOTOMY HEMATOMA EVACUATION SUBDURAL;  Surgeon: Earnie Larsson, MD;  Location: [place];  Service: Neurosurgery;  Laterality: Left;   EYE SURGERY Bilateral 2012   cataracts w lens implants   PROSTATECTOMY  2008   BPH   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   TEMPORAL ARTERY BIOPSY / LIGATION Bilateral 1997   negative   TRANSURETHRAL RESECTION OF PROSTATE  2010    Family History  Problem Relation Age of Onset   Brain cancer Mother    CAD Father    Alcoholism Father    CAD Brother    Lung cancer Brother    CAD Brother    Social History   Socioeconomic History   Marital status: Widowed    Spouse name: Not on file   Number of children: Not on file   Years of education: Not on file   Highest education level: Not on file  Occupational History   Not on file  Tobacco Use   Smoking status: Former    Packs/day: 1.00    Years: 20.00    Pack years: 20.00    Types: Cigarettes    Quit date: 01/06/1967    Years since quitting: 54.7   Smokeless tobacco: Never  Substance and Sexual Activity   Alcohol use: Yes    Comment: occassionally   Drug use: No   Sexual activity: Not on file  Other Topics Concern   Not on file  Social History Narrative   Not on file   Social Determinants of Health   Financial Resource Strain: Not on file  Food Insecurity: Not on file  Transportation Needs: Not on file  Physical Activity: Not on file  Stress: Not on file  Social Connections: Not on file    Review of Systems  Constitutional:  Positive for fatigue and unexpected weight change. Negative for chills and fever.  HENT:  Negative for congestion, ear pain and sore throat.   Respiratory:  Positive for shortness of breath. Negative for cough and wheezing.   Cardiovascular:  Negative for chest pain and leg swelling.  Gastrointestinal:  Negative for abdominal pain, constipation,  diarrhea, nausea and vomiting.  Genitourinary:  Negative for dysuria and frequency.       Poor bladder control   Musculoskeletal:  Positive for back pain. Negative for arthralgias and myalgias.       Stiffness of hands  Neurological:  Positive for weakness. Negative for dizziness and headaches.  Psychiatric/Behavioral:  Negative for dysphoric mood. The patient is not nervous/anxious.     Objective:  BP 136/72    Pulse 95    Temp (!) 97 F (36.1 C)  Resp 16    Ht 5\' 11"  (1.803 m)    Wt 248 lb (112.5 kg)    BMI 34.59 kg/m   BP/Weight 09/09/2021 07/08/2021 80/04/9832  Systolic BP 825 053 976  Diastolic BP 72 76 74  Wt. (Lbs) 248 239 243  BMI 34.59 33.33 33.89    Physical Exam Vitals reviewed.  Constitutional:      Appearance: Normal appearance. He is normal weight.  Cardiovascular:     Rate and Rhythm: Normal rate and regular rhythm.     Pulses: Normal pulses.     Heart sounds: Normal heart sounds.  Pulmonary:     Breath sounds: Normal breath sounds.  Abdominal:     General: Abdomen is flat. Bowel sounds are normal.     Palpations: Abdomen is soft.  Neurological:     Mental Status: He is alert and oriented to person, place, and time.  Psychiatric:        Mood and Affect: Mood normal.        Behavior: Behavior normal.    Diabetic Foot Exam - Simple   No data filed      Lab Results  Component Value Date   WBC 6.1 09/09/2021   HGB 16.6 09/09/2021   HCT 49.3 09/09/2021   PLT 194 09/09/2021   GLUCOSE 119 (H) 09/09/2021   CHOL 71 (L) 09/09/2021   TRIG 116 09/09/2021   HDL 37 (L) 09/09/2021   LDLCALC 13 09/09/2021   ALT 16 09/09/2021   AST 19 09/09/2021   NA 140 09/09/2021   K 4.5 09/09/2021   CL 104 09/09/2021   CREATININE 1.03 09/09/2021   BUN 12 09/09/2021   CO2 22 09/09/2021   TSH 3.800 09/09/2021   INR 1.1 02/10/2020   HGBA1C 5.5 06/09/2021      Assessment & Plan:   Problem List Items Addressed This Visit       Cardiovascular and Mediastinum    Essential hypertension, benign    Well controlled.  No changes to medicines.  Patient to call back to let us know which dose of losartan he is taking.  Continue to work on eating a healthy diet and exercise.  Labs drawn today.        Relevant Medications   rosuvastatin (CRESTOR) 40 MG tablet   losartan (COZAAR) 100 MG tablet   Other Relevant Orders   CBC with Differential/Platelet (Completed)   Comprehensive metabolic panel (Completed)   TSH (Completed)   Aortic atherosclerosis ([place]) - Primary    The current medical regimen is effective;  continue present plan and medications. Needs praluent with crestor 40 mg before bed, zetia 10 mg daily, and fish oil.       Relevant Medications   rosuvastatin (CRESTOR) 40 MG tablet   losartan (COZAAR) 100 MG tablet     Respiratory   COPD mixed type (HCC)    The current medical regimen is effective;  continue present plan and medications. Continue wixela (new rx from [place])       Relevant Medications   fluticasone-salmeterol (WIXELA INHUB) 250-50 MCG/ACT AEPB   loratadine (CLARITIN) 10 MG tablet     Endocrine   RESOLVED: Hyperlipidemia associated with type 2 diabetes mellitus (HCC)   Relevant Medications   rosuvastatin (CRESTOR) 40 MG tablet   losartan (COZAAR) 100 MG tablet     Other   Class 1 obesity due to excess calories with serious comorbidity and body mass index (BMI) of 34.0 to 34.9 in adult  Recommend continue to work on eating healthy diet and exercise.      .  No orders of the defined types were placed in this encounter.   Orders Placed This Encounter  Procedures   CBC with Differential/Platelet   Comprehensive metabolic panel   Lipid panel   TSH   Cardiovascular Risk Assessment     Follow-up: Return in about 3 months (around 12/07/2021) for chronic fasting.  An After Visit Summary was printed and given to the patient.  Rochel Brome, MD [facility] [phone]

## 2021-09-09 ENCOUNTER — Other Ambulatory Visit: Payer: Self-pay

## 2021-09-09 ENCOUNTER — Ambulatory Visit (INDEPENDENT_AMBULATORY_CARE_PROVIDER_SITE_OTHER): Payer: Medicare HMO | Admitting: Family Medicine

## 2021-09-09 VITALS — BP 136/72 | HR 95 | Temp 97.0°F | Resp 16 | Ht 71.0 in | Wt 248.0 lb

## 2021-09-09 DIAGNOSIS — E782 Mixed hyperlipidemia: Secondary | ICD-10-CM | POA: Diagnosis not present

## 2021-09-09 DIAGNOSIS — Z6834 Body mass index (BMI) 34.0-34.9, adult: Secondary | ICD-10-CM | POA: Diagnosis not present

## 2021-09-09 DIAGNOSIS — E66811 Obesity, class 1: Secondary | ICD-10-CM

## 2021-09-09 DIAGNOSIS — I10 Essential (primary) hypertension: Secondary | ICD-10-CM

## 2021-09-09 DIAGNOSIS — J45909 Unspecified asthma, uncomplicated: Secondary | ICD-10-CM | POA: Insufficient documentation

## 2021-09-09 DIAGNOSIS — E6609 Other obesity due to excess calories: Secondary | ICD-10-CM

## 2021-09-09 DIAGNOSIS — E1169 Type 2 diabetes mellitus with other specified complication: Secondary | ICD-10-CM

## 2021-09-09 DIAGNOSIS — J449 Chronic obstructive pulmonary disease, unspecified: Secondary | ICD-10-CM

## 2021-09-09 DIAGNOSIS — I700 Atherosclerosis of aorta: Secondary | ICD-10-CM

## 2021-09-09 HISTORY — DX: Unspecified asthma, uncomplicated: J45.909

## 2021-09-09 NOTE — Patient Instructions (Signed)
[facility] if [facility] will [facility] grant.  If not, call other pharmacies and let us know if we need to send it elsewhere   Call back and let us know if losartan is 50 mg or 100 mg daily.

## 2021-09-10 ENCOUNTER — Encounter: Payer: Self-pay | Admitting: Family Medicine

## 2021-09-10 LAB — COMPREHENSIVE METABOLIC PANEL
ALT: 16 IU/L (ref 0–44)
AST: 19 IU/L (ref 0–40)
Albumin/Globulin Ratio: 2 (ref 1.2–2.2)
Albumin: 4.3 g/dL (ref 3.6–4.6)
Alkaline Phosphatase: 54 IU/L (ref 44–121)
BUN/Creatinine Ratio: 12 (ref 10–24)
BUN: 12 mg/dL (ref 8–27)
Bilirubin Total: 0.7 mg/dL (ref 0.0–1.2)
CO2: 22 mmol/L (ref 20–29)
Calcium: 9.3 mg/dL (ref 8.6–10.2)
Chloride: 104 mmol/L (ref 96–106)
Creatinine, Ser: 1.03 mg/dL (ref 0.76–1.27)
Globulin, Total: 2.2 g/dL (ref 1.5–4.5)
Glucose: 119 mg/dL — ABNORMAL HIGH (ref 70–99)
Potassium: 4.5 mmol/L (ref 3.5–5.2)
Sodium: 140 mmol/L (ref 134–144)
Total Protein: 6.5 g/dL (ref 6.0–8.5)
eGFR: 73 mL/min/{1.73_m2} (ref >59)

## 2021-09-10 LAB — CBC WITH DIFFERENTIAL/PLATELET
Basophils Absolute: 0 10*3/uL (ref 0.0–0.2)
Basos: 1 %
EOS (ABSOLUTE): 0.3 10*3/uL (ref 0.0–0.4)
Eos: 5 %
Hematocrit: 49.3 % (ref 37.5–51.0)
Hemoglobin: 16.6 g/dL (ref 13.0–17.7)
Immature Grans (Abs): 0 10*3/uL (ref 0.0–0.1)
Immature Granulocytes: 1 %
Lymphocytes Absolute: 1.3 10*3/uL (ref 0.7–3.1)
Lymphs: 22 %
MCH: 30.9 pg (ref 26.6–33.0)
MCHC: 33.7 g/dL (ref 31.5–35.7)
MCV: 92 fL (ref 79–97)
Monocytes Absolute: 0.6 10*3/uL (ref 0.1–0.9)
Monocytes: 10 %
Neutrophils Absolute: 3.7 10*3/uL (ref 1.4–7.0)
Neutrophils: 61 %
Platelets: 194 10*3/uL (ref 150–450)
RBC: 5.37 x10E6/uL (ref 4.14–5.80)
RDW: 14.3 % (ref 11.6–15.4)
WBC: 6.1 10*3/uL (ref 3.4–10.8)

## 2021-09-10 LAB — LIPID PANEL
Chol/HDL Ratio: 1.9 ratio (ref 0.0–5.0)
Cholesterol, Total: 71 mg/dL — ABNORMAL LOW (ref 100–199)
HDL: 37 mg/dL — ABNORMAL LOW (ref >39)
LDL Chol Calc (NIH): 13 mg/dL (ref 0–99)
Triglycerides: 116 mg/dL (ref 0–149)
VLDL Cholesterol Cal: 21 mg/dL (ref 5–40)

## 2021-09-10 LAB — TSH: TSH: 3.8 u[IU]/mL (ref 0.450–4.500)

## 2021-09-10 LAB — CARDIOVASCULAR RISK ASSESSMENT

## 2021-09-10 NOTE — Assessment & Plan Note (Signed)
Well controlled.  No changes to medicines.  Patient to call back to let us know which dose of losartan he is taking.  Continue to work on eating a healthy diet and exercise.  Labs drawn today.

## 2021-09-10 NOTE — Assessment & Plan Note (Signed)
The current medical regimen is effective;  continue present plan and medications. Continue wixela (new rx from [place])

## 2021-09-10 NOTE — Assessment & Plan Note (Signed)
Recommend continue to work on eating healthy diet and exercise.  

## 2021-09-10 NOTE — Assessment & Plan Note (Signed)
The current medical regimen is effective;  continue present plan and medications. Needs praluent with crestor 40 mg before bed, zetia 10 mg daily, and fish oil.

## 2021-09-14 ENCOUNTER — Ambulatory Visit (INDEPENDENT_AMBULATORY_CARE_PROVIDER_SITE_OTHER): Payer: Medicare HMO | Admitting: Nurse Practitioner

## 2021-09-14 ENCOUNTER — Encounter: Payer: Self-pay | Admitting: Nurse Practitioner

## 2021-09-14 ENCOUNTER — Other Ambulatory Visit: Payer: Self-pay

## 2021-09-14 VITALS — BP 102/62 | HR 111 | Temp 97.1°F | Ht 71.0 in | Wt 246.0 lb

## 2021-09-14 DIAGNOSIS — I951 Orthostatic hypotension: Secondary | ICD-10-CM | POA: Diagnosis not present

## 2021-09-14 DIAGNOSIS — I10 Essential (primary) hypertension: Secondary | ICD-10-CM

## 2021-09-14 NOTE — Progress Notes (Signed)
Acute Office Visit  Subjective:    Patient ID: Christian Legate., male    DOB: [birth year], 81 y.o.   MRN: [id number]  CHIEF COMPLAINT: Hypotension  HPI: Patient is in today for an episode of hypotension and tremors. BP 94/45 yesterday and 104/65 this am. Symptoms subsided. Onset was yesterday. Denies trying treatments at home. States he currently take Lopressor 50 mg daily for hypertension. He was recently prescribed Flomax 0.4 mg TWICE DAILY for BPH by urologist. He is also prescribed Detrol for overactive bladder.  He has held all prescription medications today. Initial BP 102/62 in-office today, pulse 111.    Symptoms: No chest pain No chest pressure  No palpitations No syncope  No dyspnea No orthopnea  No paroxysmal nocturnal dyspnea No lower extremity edema   Pertinent labs: Lab Results  Component Value Date   CHOL 71 (L) 09/09/2021   HDL 37 (L) 09/09/2021   LDLCALC 13 09/09/2021   TRIG 116 09/09/2021   CHOLHDL 1.9 09/09/2021   Lab Results  Component Value Date   NA 140 09/09/2021   K 4.5 09/09/2021   CREATININE 1.03 09/09/2021   EGFR 73 09/09/2021   GFRNONAA 68 07/27/2020   GLUCOSE 119 (H) 09/09/2021        Past Medical History:  Diagnosis Date   Actinic keratosis 10/31/2020   Benign prostatic hyperplasia 10/31/2020   Mar 13, 2014 Entered By: Alycia Rossetti B Comment: s/p TURP 2010   Body mass index (BMI) 33.0-33.9, adult 11/23/2020   BPPV (benign paroxysmal positional vertigo)    Calculus of kidney    Calculus of kidney    Cancer ([place]) 2015   skin cancer removed off left forearm    Cataract extraction status 10/31/2020   Apr 17, 2009 Entered By: Alinda Dooms Comment: bilateral   Closed fracture of body of sternum 11/10/2019   Contusion of right thigh 11/10/2019   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   Epidermoid cyst of skin 10/31/2020   Fatigue 11/23/2020   Gastroesophageal reflux disease 10/31/2020   GERD (gastroesophageal reflux  disease)    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   Hemorrhage of gastrointestinal tract 10/31/2020   History of basal cell carcinoma (BCC)    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   Joint pain 11/23/2020   Long-term current use of testosterone replacement therapy 11/23/2020   Low libido 11/23/2020   Mixed hyperlipidemia    Osteoarthritis    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   Polyp of colon 11/01/2020   Aug 31, 2015 Entered By: Alycia Rossetti B Comment: colonoscopy 05/2015 - needs repeat ASAP   Prediabetes    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   Sleep disturbance 11/23/2020   Status post craniotomy 02/10/2020   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   Urge incontinence of urine 11/23/2020   Vitamin D deficiency     Past Surgical History:  Procedure Laterality Date   CRANIOTOMY Left 02/10/2020   Procedure: CRANIOTOMY HEMATOMA EVACUATION SUBDURAL;  Surgeon: Earnie Larsson, MD;  Location: [facility];  Service: Neurosurgery;  Laterality: Left;   EYE SURGERY Bilateral 2012   cataracts w lens implants   PROSTATECTOMY  2008   BPH   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   TEMPORAL ARTERY BIOPSY / LIGATION Bilateral 1997   negative   TRANSURETHRAL RESECTION OF PROSTATE  2010    Family History  Problem Relation Age of Onset   Brain cancer Mother    CAD Father    Alcoholism Father  CAD Brother    Lung cancer Brother    CAD Brother     Social History   Socioeconomic History   Marital status: Widowed    Spouse name: Not on file   Number of children: Not on file   Years of education: Not on file   Highest education level: Not on file  Occupational History   Not on file  Tobacco Use   Smoking status: Former    Packs/day: 1.00    Years: 20.00    Pack years: 20.00    Types: Cigarettes    Quit date: 01/06/1967    Years since quitting: 54.7   Smokeless tobacco: Never  Substance and Sexual Activity   Alcohol use: Yes    Comment: occassionally   Drug use: No    Sexual activity: Not on file  Other Topics Concern   Not on file  Social History Narrative   Not on file   Social Determinants of Health   Financial Resource Strain: Not on file  Food Insecurity: Not on file  Transportation Needs: Not on file  Physical Activity: Not on file  Stress: Not on file  Social Connections: Not on file  Intimate Partner Violence: Not on file    Outpatient Medications Prior to Visit  Medication Sig Dispense Refill   ezetimibe (ZETIA) 10 MG tablet Take 10 mg by mouth daily.     fluticasone-salmeterol (WIXELA INHUB) 250-50 MCG/ACT AEPB Inhale 1 puff into the lungs in the morning and at bedtime.     loratadine (CLARITIN) 10 MG tablet TAKE ONE TABLET BY MOUTH DAILY FOR RHINITIS     losartan (COZAAR) 100 MG tablet TAKE ONE-HALF TABLET BY MOUTH DAILY FOR BLOOD PRESSURE     losartan (COZAAR) 50 MG tablet TAKE 1 TABLET BY MOUTH EVERY DAY 90 tablet 0   metFORMIN (GLUCOPHAGE) 500 MG tablet TAKE 1 TABLET (500 MG TOTAL) BY MOUTH DAILY. 90 tablet 0   naproxen (NAPROSYN) 500 MG tablet TAKE 1 TABLET (500 MG TOTAL) BY MOUTH 2 (TWO) TIMES DAILY WITH A MEAL. AS NEEDED FOR PAIN. (Patient taking differently: Take 500 mg by mouth 2 (two) times daily as needed for mild pain.) 60 tablet 0   Omega-3 Fatty Acids (FISH OIL) 1000 MG CPDR Take 1,000 mg by mouth in the morning and at bedtime.     omeprazole (PRILOSEC) 20 MG capsule Take 20 mg by mouth daily.     PRALUENT 75 MG/ML SOAJ INJECT 75 MLS INTO THE SKIN EVERY 14 (FOURTEEN) DAYS. 6 mL 0   Probiotic Product (PROBIOTIC DAILY PO) Take 1 tablet by mouth 2 (two) times daily.     rosuvastatin (CRESTOR) 40 MG tablet TAKE ONE-HALF TABLET BY MOUTH ONCE A DAY FOR CHOLESTEROL     tadalafil (CIALIS) 10 MG tablet      tamsulosin (FLOMAX) 0.4 MG CAPS capsule TAKE ONE CAPSULE BY MOUTH TWICE A DAY :  PLEASE WATCH FOR DIZZINESS AND LOW BLOOD PRESSURE     tolterodine (DETROL LA) 4 MG 24 hr capsule Take 4 mg by mouth daily.     No  facility-administered medications prior to visit.    Allergies  Allergen Reactions   Atorvastatin     Other reaction(s): Muscle pain   Codeine Other (See Comments)   Niacin Itching    Other reaction(s): Itching, Flushing   No Known Allergies     Other reaction(s): Dizziness   Piroxicam     Other reaction(s): Gastric ulcer with hemorrhage   Pravastatin  Other reaction(s): Muscle pain   Statins     myalgias   Zocor [Simvastatin]     Other reaction(s): Muscle pain    Review of Systems  Constitutional:  Negative for chills, diaphoresis, fatigue and fever.  HENT:  Negative for congestion, ear pain and sore throat.   Respiratory:  Negative for cough and shortness of breath.   Cardiovascular:  Negative for chest pain and leg swelling.  Gastrointestinal:  Negative for abdominal pain, constipation, diarrhea, nausea and vomiting.  Genitourinary:  Negative for dysuria and urgency.  Musculoskeletal:  Negative for arthralgias and myalgias.  Neurological:  Positive for tremors and weakness. Negative for dizziness and headaches.  Psychiatric/Behavioral:  Negative for dysphoric mood.       Objective:    Physical Exam Vitals reviewed.  Constitutional:      Appearance: Normal appearance.  HENT:     Head: Normocephalic.     Right Ear: Tympanic membrane normal.     Left Ear: Tympanic membrane normal.     Nose: Nose normal.     Mouth/Throat:     Mouth: Mucous membranes are moist.  Eyes:     Pupils: Pupils are equal, round, and reactive to light.  Cardiovascular:     Rate and Rhythm: Regular rhythm. Tachycardia present.     Pulses: Normal pulses.     Heart sounds: Normal heart sounds.  Pulmonary:     Effort: Pulmonary effort is normal.     Breath sounds: Normal breath sounds.  Abdominal:     General: Bowel sounds are normal.     Palpations: Abdomen is soft.  Musculoskeletal:     Cervical back: Neck supple.  Skin:    General: Skin is warm and dry.     Capillary Refill:  Capillary refill takes less than 2 seconds.  Neurological:     General: No focal deficit present.     Mental Status: He is alert and oriented to person, place, and time.  Psychiatric:        Mood and Affect: Mood normal.        Behavior: Behavior normal.    BP 102/62    Pulse (!) 111    Temp (!) 97.1 F (36.2 C)    Ht 5' 11"  (1.803 m)    Wt 246 lb (111.6 kg)    SpO2 97%    BMI 34.31 kg/m   Wt Readings from Last 3 Encounters:  09/09/21 248 lb (112.5 kg)  07/08/21 239 lb (108.4 kg)  06/09/21 243 lb (110.2 kg)    Health Maintenance Due  Topic Date Due   FOOT EXAM  Never done   OPHTHALMOLOGY EXAM  Never done   COVID-19 Vaccine (4 - Booster for Pfizer series) 02/09/2021   Zoster Vaccines- Shingrix (2 of 2) 09/12/2021       Lab Results  Component Value Date   TSH 3.800 09/09/2021   Lab Results  Component Value Date   WBC 6.1 09/09/2021   HGB 16.6 09/09/2021   HCT 49.3 09/09/2021   MCV 92 09/09/2021   PLT 194 09/09/2021   Lab Results  Component Value Date   NA 140 09/09/2021   K 4.5 09/09/2021   CO2 22 09/09/2021   GLUCOSE 119 (H) 09/09/2021   BUN 12 09/09/2021   CREATININE 1.03 09/09/2021   BILITOT 0.7 09/09/2021   ALKPHOS 54 09/09/2021   AST 19 09/09/2021   ALT 16 09/09/2021   PROT 6.5 09/09/2021   ALBUMIN 4.3 09/09/2021   CALCIUM  9.3 09/09/2021   ANIONGAP 11 02/11/2020   EGFR 73 09/09/2021   Lab Results  Component Value Date   CHOL 71 (L) 09/09/2021   Lab Results  Component Value Date   HDL 37 (L) 09/09/2021   Lab Results  Component Value Date   LDLCALC 13 09/09/2021   Lab Results  Component Value Date   TRIG 116 09/09/2021   Lab Results  Component Value Date   CHOLHDL 1.9 09/09/2021   Lab Results  Component Value Date   HGBA1C 5.5 06/09/2021       Assessment & Plan:   1. Orthostatic hypotension - Orthostatic vital signs -hold Losartan today  -monitor BP, keep log -decrease Flomax to 0.4 mg at bedtime -change positions  slowly  2. Essential hypertension, benign - Orthostatic vital signs     Hold blood pressure medication Losartan today Monitor BP at home Push fluids, especially water Change positions slowly Follow-up as needed  Follow-up: PRN  An After Visit Summary was printed and given to the patient.  I, Rip Harbour, NP, have reviewed all documentation for this visit. The documentation on 09/14/21 for the exam, diagnosis, procedures, and orders are all accurate and complete.    Signed, Rip Harbour, NP [place] [phone]

## 2021-09-14 NOTE — Patient Instructions (Addendum)
Hold blood pressure medication Losartan  Monitor BP at home Push fluids, especially water Change positions slowly Follow-up as needed    Orthostatic Hypotension Blood pressure is a measurement of how strongly, or weakly, your circulating blood is pressing against the walls of your arteries. Orthostatic hypotension is a drop in blood pressure that can happen when you change positions, such as when you go from lying down to standing. Arteries are blood vessels that carry blood from your heart throughout your body. When blood pressure is too low, you may not get enough blood to your brain or to the rest of your organs. Orthostatic hypotension can cause light-headedness, sweating, rapid heartbeat, blurred vision, and fainting. These symptoms require further investigation into the cause. What are the causes? Orthostatic hypotension can be caused by many things, including: Sudden changes in posture, such as standing up quickly after you have been sitting or lying down. Loss of blood (anemia) or loss of body fluids (dehydration). Heart problems, neurologic problems, or hormone problems. Pregnancy. Aging. The risk for this condition increases as you get older. Severe infection (sepsis). Certain medicines, such as medicines for high blood pressure or medicines that make the body lose excess fluids (diuretics). What are the signs or symptoms? Symptoms of this condition may include: Weakness, light-headedness, or dizziness. Sweating. Blurred vision. Tiredness (fatigue). Rapid heartbeat. Fainting, in severe cases. How is this diagnosed? This condition is diagnosed based on: Your symptoms and medical history. Your blood pressure measurements. Your health care provider will check your blood pressure when you are: Lying down. Sitting. Standing. A blood pressure reading is recorded as two numbers, such as "120 over 80" (or 120/80). The first ("top") number is called the systolic pressure. It is a  measure of the pressure in your arteries as your heart beats. The second ("bottom") number is called the diastolic pressure. It is a measure of the pressure in your arteries when your heart relaxes between beats. Blood pressure is measured in a unit called mmHg. Healthy blood pressure for most adults is 120/80 mmHg. Orthostatic hypotension is defined as a 20 mmHg drop in systolic pressure or a 10 mmHg drop in diastolic pressure within 3 minutes of standing. Other information or tests that may be used to diagnose orthostatic hypotension include: Your other vital signs, such as your heart rate and temperature. Blood tests. An electrocardiogram (ECG) or echocardiogram. A Holter monitor. This is a device you wear that records your heart rhythm continuously, usually for 24-48 hours. Tilt table test. For this test, you will be safely secured to a table that moves you from a lying position to an upright position. Your heart rhythm and blood pressure will be monitored during the test. How is this treated? This condition may be treated by: Changing your diet. This may involve eating more salt (sodium) or drinking more water. Changing the dosage of certain medicines you are taking that might be lowering your blood pressure. Correcting the underlying reason for the orthostatic hypotension. Wearing compression stockings. Taking medicines to raise your blood pressure. Avoiding actions that trigger symptoms. Follow these instructions at home: Medicines Take over-the-counter and prescription medicines only as told by your health care provider. Follow instructions from your health care provider about changing the dosage of your current medicines, if this applies. Do not stop or adjust any of your medicines on your own. Eating and drinking  Drink enough fluid to keep your urine pale yellow. Eat extra salt only as directed. Do not add extra salt  to your diet unless advised by your health care provider. Eat  frequent, small meals. Avoid standing up suddenly after eating. General instructions  Get up slowly from lying down or sitting positions. This gives your blood pressure a chance to adjust. Avoid hot showers and excessive heat as directed by your health care provider. Engage in regular physical activity as directed by your health care provider. If you have compression stockings, wear them as told. Keep all follow-up visits. This is important. Contact a health care provider if: You have a fever for more than 2-3 days. You feel more thirsty than usual. You feel dizzy or weak. Get help right away if: You have chest pain. You have a fast or irregular heartbeat. You become sweaty or feel light-headed. You feel short of breath. You faint. You have any symptoms of a stroke. "BE FAST" is an easy way to remember the main warning signs of a stroke: B - Balance. Signs are dizziness, sudden trouble walking, or loss of balance. E - Eyes. Signs are trouble seeing or a sudden change in vision. F - Face. Signs are sudden weakness or numbness of the face, or the face or eyelid drooping on one side. A - Arms. Signs are weakness or numbness in an arm. This happens suddenly and usually on one side of the body. S - Speech. Signs are sudden trouble speaking, slurred speech, or trouble understanding what people say. T - Time. Time to call emergency services. Write down what time symptoms started. You have other signs of a stroke, such as: A sudden, severe headache with no known cause. Nausea or vomiting. Seizure. These symptoms may represent a serious problem that is an emergency. Do not wait to see if the symptoms will go away. Get medical help right away. Call your local emergency services (911 in the [place] Do not drive yourself to the hospital. Summary Orthostatic hypotension is a sudden drop in blood pressure. It can cause light-headedness, sweating, rapid heartbeat, blurred vision, and  fainting. Orthostatic hypotension can be diagnosed by having your blood pressure taken while lying down, sitting, and then standing. Treatment may involve changing your diet, wearing compression stockings, sitting up slowly, adjusting your medicines, or correcting the underlying reason for the orthostatic hypotension. Get help right away if you have chest pain, a fast or irregular heartbeat, or symptoms of a stroke. This information is not intended to replace advice given to you by your health care provider. Make sure you discuss any questions you have with your health care provider. Document Revised: 10/07/2020 Document Reviewed: 10/07/2020 Elsevier Patient Education  [place].    Hypotension As your heart beats, it forces blood through your body. This force is called blood pressure. If you have hypotension, you have low blood pressure.  When your blood pressure is too low, you may not get enough blood to your brain or other parts of your body. This may cause you to feel weak, light-headed, have a fast heartbeat, or even faint. Low blood pressure may be harmless, or it may cause serious problems. What are the causes? Blood loss. Not enough water in the body (dehydration). Heart problems. Hormone problems. Pregnancy. A very bad infection. Not having enough of certain nutrients. Very bad allergic reactions. Certain medicines. What increases the risk? Age. The risk increases as you get older. Conditions that affect the heart or the brain and spinal cord (central nervous system). What are the signs or symptoms? Feeling: Weak. Light-headed. Dizzy. Tired (fatigued). Blurred  vision. Fast heartbeat. Fainting, in very bad cases. How is this treated? Changing your diet. This may involve drinking more water or including more salt (sodium) in your diet by eating high-salt foods. Taking medicines to raise your blood pressure. Changing how much you take (the dosage) of some of your  medicines. Wearing compression stockings. These stockings help to prevent blood clots and reduce swelling in your legs. In some cases, you may need to go to the hospital to: Receive fluids through an IV tube. Receive donated blood through an IV tube (transfusion). Get treated for an infection or heart problems, if this applies. Be monitored while medicines that you are taking wear off. Follow these instructions at home: Eating and drinking  Drink enough fluids to keep your pee (urine) pale yellow. Eat a healthy diet. Follow instructions from your doctor about what you can eat or drink. A healthy diet includes: Fresh fruits and vegetables. Whole grains. Low-fat (lean) meats. Low-fat dairy products. If told, include more salt in your diet. Do not add extra salt to your diet unless your doctor tells you to. Eat small meals often. Avoid standing up quickly after you eat. Medicines Take over-the-counter and prescription medicines only as told by your doctor. Follow instructions from your doctor about changing how much you take of your medicines, if this applies. Do not stop or change any of your medicines on your own. General instructions  Wear compression stockings as told by your doctor. Get up slowly from lying down or sitting. Avoid hot showers and a lot of heat as told by your doctor. Return to your normal activities when your doctor says that it is safe. Do not smoke or use any products that contain nicotine or tobacco. If you need help quitting, ask your doctor. Keep all follow-up visits. Contact a doctor if: You vomit. You have watery poop (diarrhea). You have a fever for more than 2-3 days. You feel more thirsty than normal. You feel weak and tired. Get help right away if: You have chest pain. You have a fast or uneven heartbeat. You lose feeling (have numbness) in any part of your body. You cannot move your arms or your legs. You have trouble talking. You get sweaty or  feel light-headed. You faint. You have trouble breathing. You have trouble staying awake. You feel mixed up (confused). These symptoms may be an emergency. Get help right away. Call 911. Do not wait to see if the symptoms will go away. Do not drive yourself to the hospital. Summary Hypotension is also called low blood pressure. It is when the force of blood pumping through your body is too weak. Hypotension may be harmless, or it may cause serious problems. Treatment may include changing your diet and medicines, and wearing compression stockings. In very bad cases, you may need to go to the hospital. This information is not intended to replace advice given to you by your health care provider. Make sure you discuss any questions you have with your health care provider. Document Revised: 03/14/2021 Document Reviewed: 03/14/2021 Elsevier Patient Education  [place].

## 2021-09-15 IMAGING — CT CT HEAD W/O CM
4 series · 16 of 47 positions shown, 18 images · non-contrast
Comparison: Head CT 02/11/2020

CLINICAL DATA: Subdural hematoma follow-up

EXAM:
CT HEAD WITHOUT CONTRAST
TECHNIQUE: Contiguous axial images were obtained from the base of the skull
through the vertex without intravenous contrast.

[Series 3: head wo · axial · 0.47mm/px · z∈[-154,-20]mm · 7 of 37 slices shown, 9 images]
[im 5/37  brain]
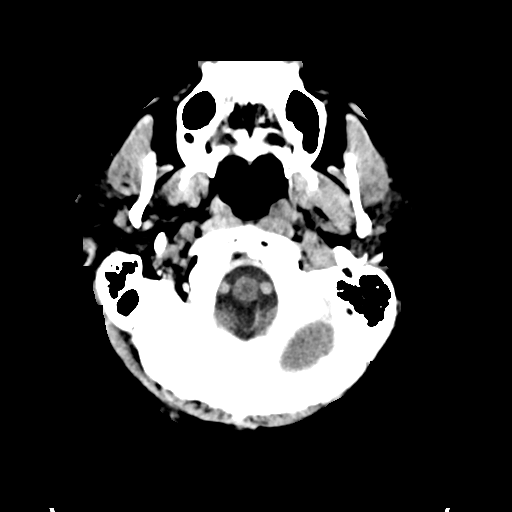
[im 5/37  bone]
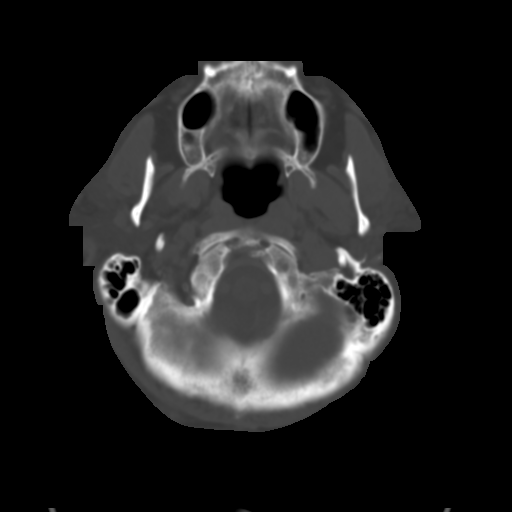
[im 10/37  brain]
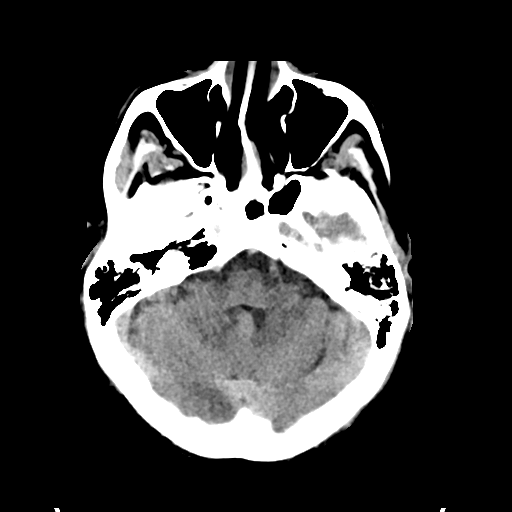
[im 14/37  brain]
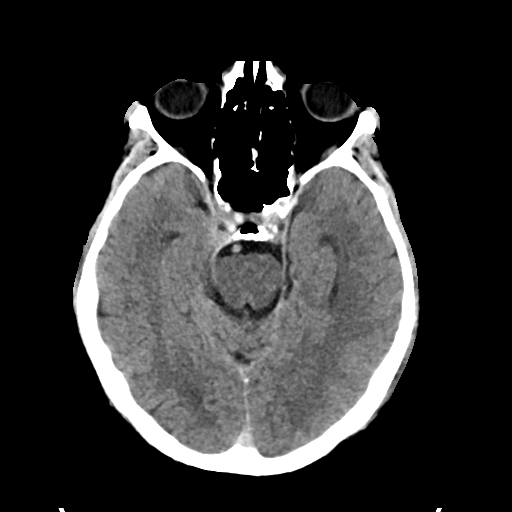
[im 19/37  brain]
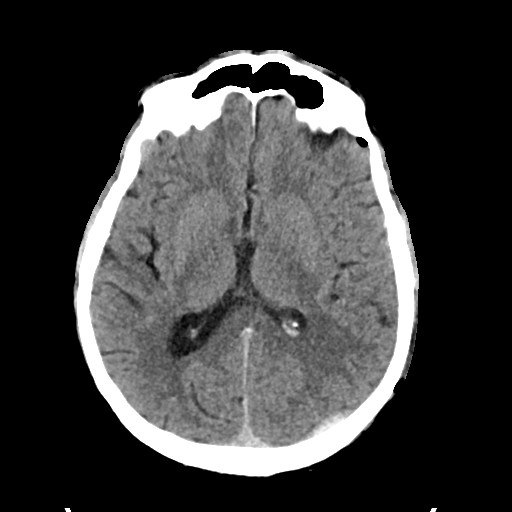
[im 23/37  brain]
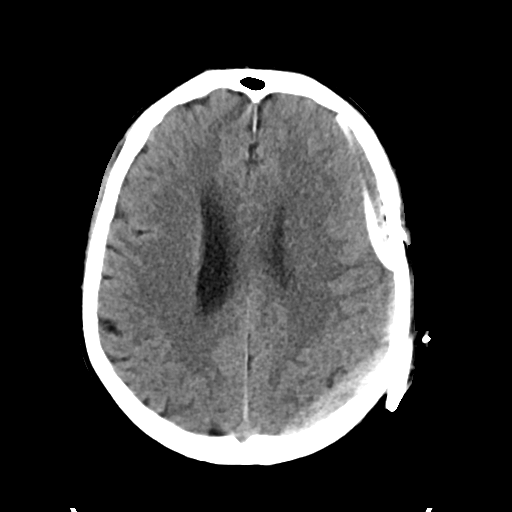
[im 23/37  bone]
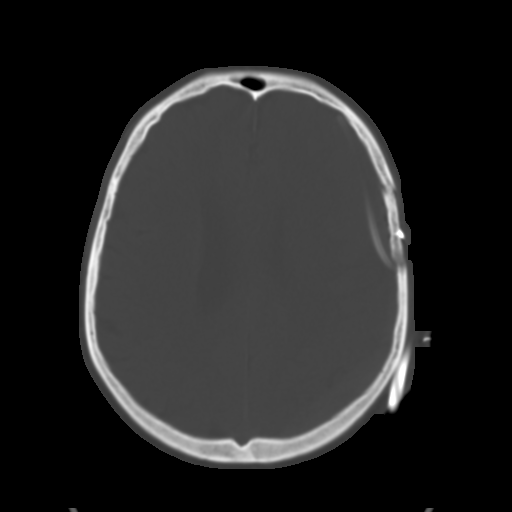
[im 28/37  brain]
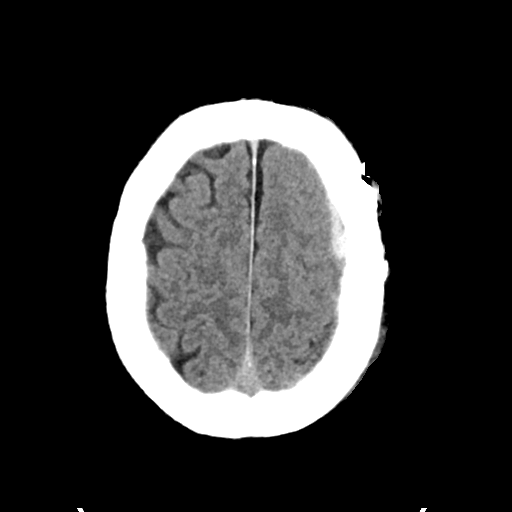
[im 32/37  brain]
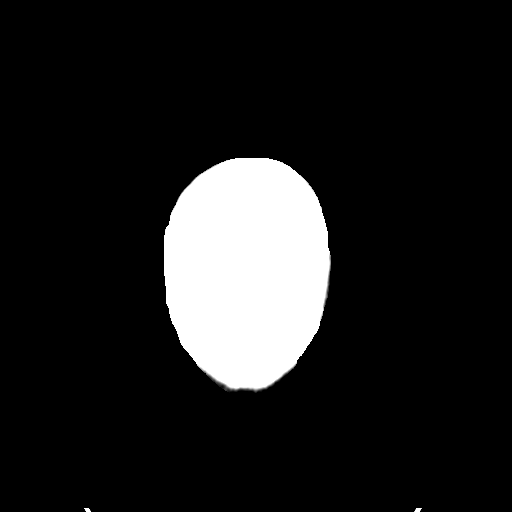

[Series 4: head bone · axial · 0.47mm/px · z∈[-156,-120]mm · 3 of 91 slices shown]
[im 10/91  bone]
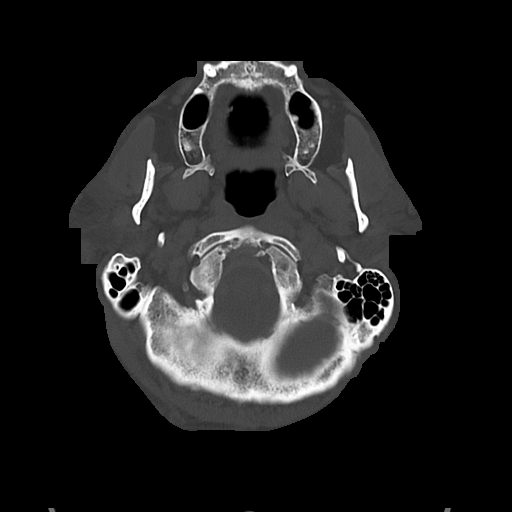
[im 19/91  bone]
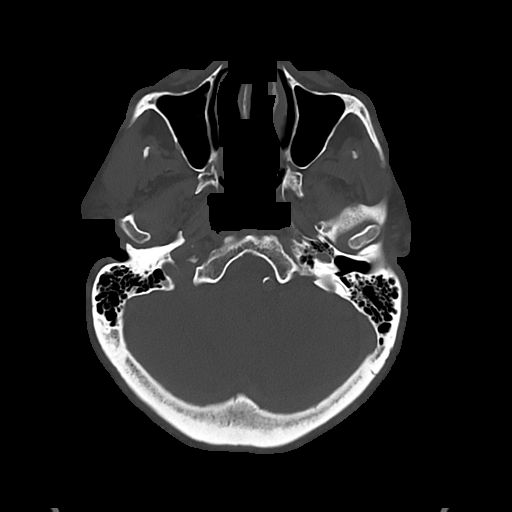
[im 28/91  bone]
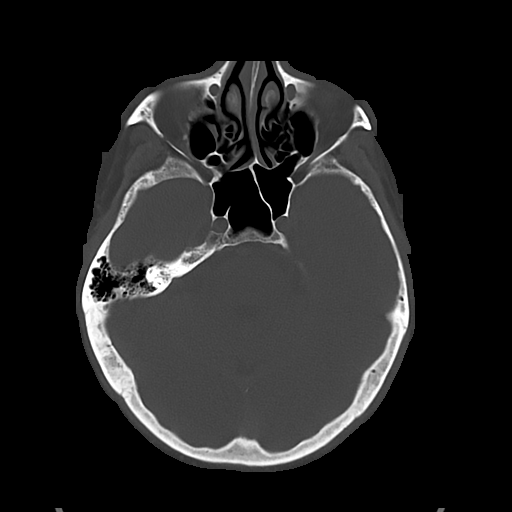

[Series 5: cor soft · coronal · 0.35mm/px · 3 of 77 slices shown]
[im 26/77  brain]
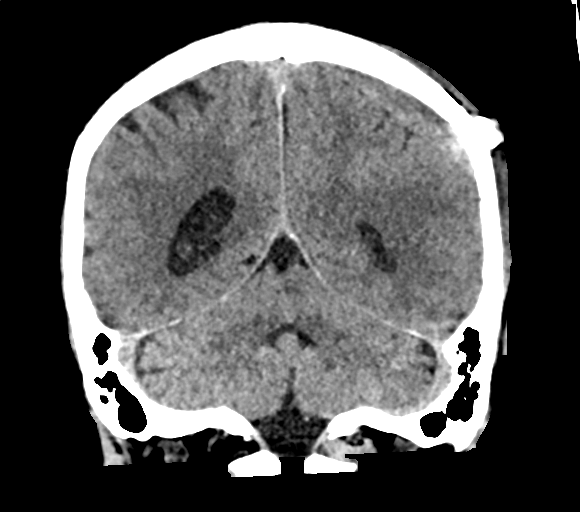
[im 34/77  brain]
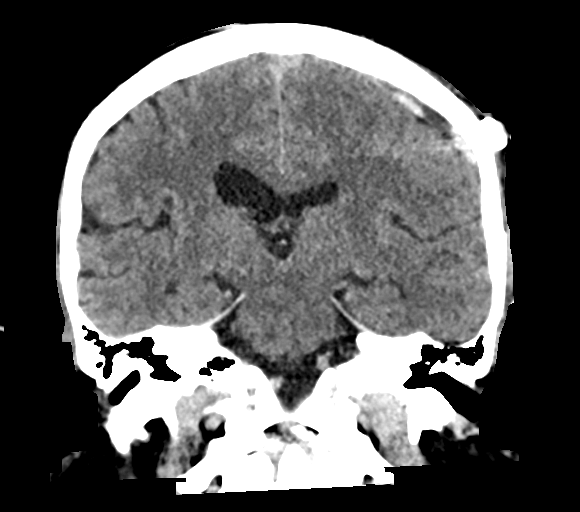
[im 43/77  brain]
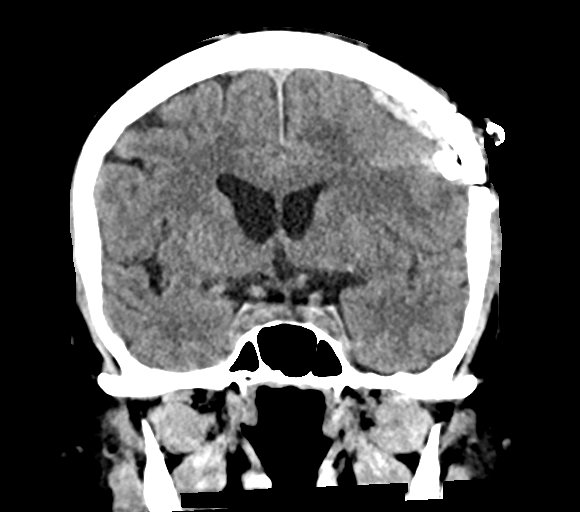

[Series 6: sag soft · sagittal · 0.35mm/px · 3 of 69 slices shown]
[im 23/69  brain]
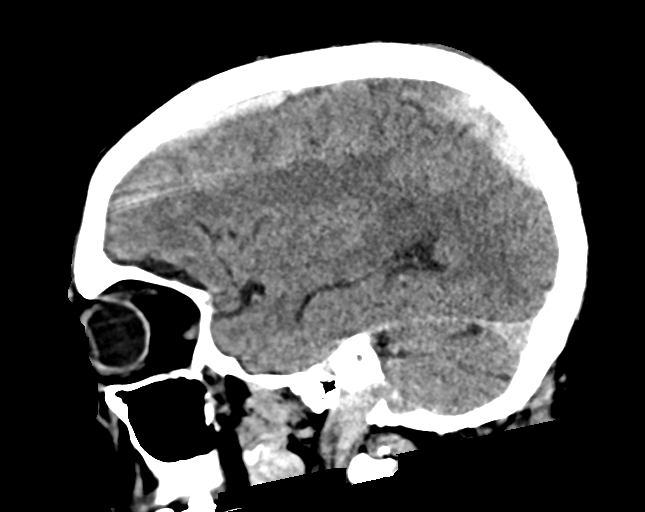
[im 35/69  brain]
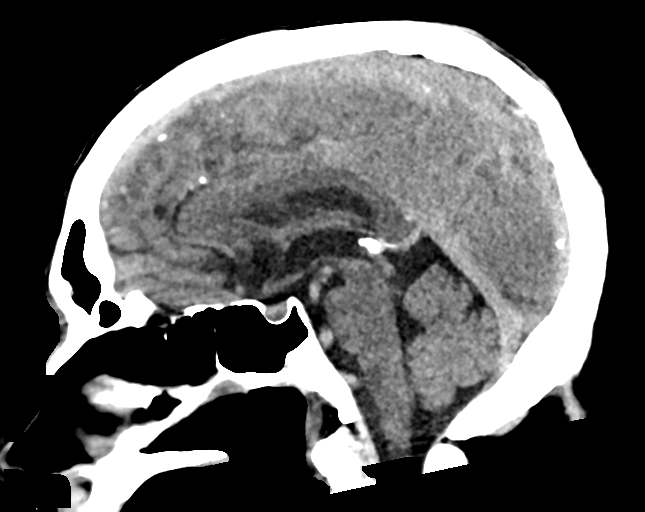
[im 46/69  brain]
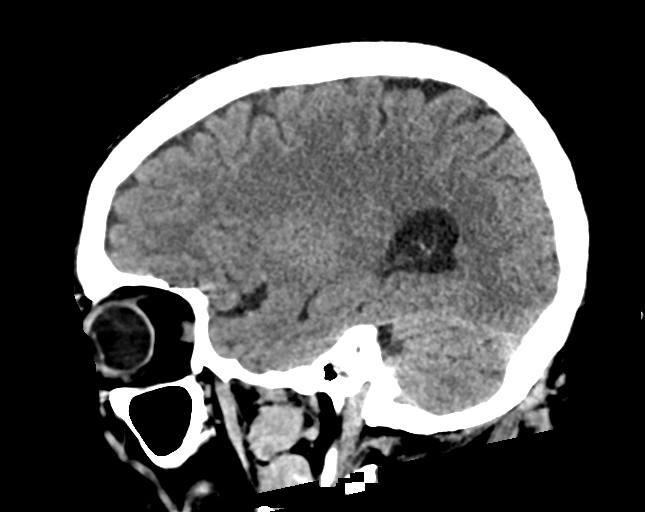

[16 of 47 positions shown; findings below may reference images not displayed]

FINDINGS: Brain: Unchanged position of subdural drainage catheter along the
anterior left convexity. The amount of subdural blood has slightly
decreased, now measuring 10 mm in thickness. There is no new site of
hemorrhage. No midline shift or other mass effect.

Vascular: No hyperdense vessel or unexpected calcification.

Skull: Normal. Negative for fracture or focal lesion.

Sinuses/Orbits: No acute finding.

Other: None.
IMPRESSION: 1. Unchanged position of subdural drainage catheter along the
anterior left convexity. The amount of subdural blood has slightly
decreased, now measuring 10 mm in thickness.
2. No new site of hemorrhage.

## 2021-09-26 ENCOUNTER — Ambulatory Visit (INDEPENDENT_AMBULATORY_CARE_PROVIDER_SITE_OTHER): Payer: Medicare HMO | Admitting: Nurse Practitioner

## 2021-09-26 ENCOUNTER — Encounter: Payer: Self-pay | Admitting: Nurse Practitioner

## 2021-09-26 ENCOUNTER — Other Ambulatory Visit: Payer: Self-pay

## 2021-09-26 VITALS — BP 146/78 | HR 94 | Temp 98.5°F | Ht 71.0 in | Wt 247.0 lb

## 2021-09-26 DIAGNOSIS — M79641 Pain in right hand: Secondary | ICD-10-CM | POA: Diagnosis not present

## 2021-09-26 DIAGNOSIS — R5383 Other fatigue: Secondary | ICD-10-CM

## 2021-09-26 NOTE — Progress Notes (Signed)
Subjective:  Patient ID: Christian Deem., male    DOB: 09-Mar-1941  Age: 81 y.o. MRN: [id number]  Chief Complaint  Patient presents with   Fatigue    Feels bad    HPI  Christian Lowe is an 81 year old Caucasian male that presents for evaluation of fatigue and "feeling bad". He was seen in the office on 09/14/21 for hypotension after increasing Flomax to 0.4 mg BID per [facility] urologist order. He was instructed to hold his Losartan temporarily due to hypotension. He tells me today that he stopped all of his medications. BP at home yesterday 147/84 in-office BP146/78. Denies chest pain, dyspnea, or dizziness.  Christian Lowe tells me he is experiencing right hand stiffness and pain.Pt is currently prescribed Crestor 40 mg for hyperlipidemia.  Lipid panel normal per last labs on 09/09/21 Current Outpatient Medications on File Prior to Visit  Medication Sig Dispense Refill   tolterodine (DETROL LA) 4 MG 24 hr capsule Take 4 mg by mouth daily.     fluticasone-salmeterol (ADVAIR) 250-50 MCG/ACT AEPB Inhale 1 puff into the lungs in the morning and at bedtime. (Patient not taking: Reported on 09/26/2021)     loratadine (CLARITIN) 10 MG tablet TAKE ONE TABLET BY MOUTH DAILY FOR RHINITIS (Patient not taking: Reported on 09/26/2021)     losartan (COZAAR) 100 MG tablet TAKE ONE-HALF TABLET BY MOUTH DAILY FOR BLOOD PRESSURE (Patient not taking: Reported on 09/26/2021)     metFORMIN (GLUCOPHAGE) 500 MG tablet TAKE 1 TABLET (500 MG TOTAL) BY MOUTH DAILY. (Patient not taking: Reported on 09/26/2021) 90 tablet 0   naproxen (NAPROSYN) 500 MG tablet TAKE 1 TABLET (500 MG TOTAL) BY MOUTH 2 (TWO) TIMES DAILY WITH A MEAL. AS NEEDED FOR PAIN. (Patient not taking: Reported on 09/26/2021) 60 tablet 0   Omega-3 Fatty Acids (FISH OIL) 1000 MG CPDR Take 1,000 mg by mouth in the morning and at bedtime. (Patient not taking: Reported on 09/26/2021)     omeprazole (PRILOSEC) 20 MG capsule Take 20 mg by mouth daily. (Patient not taking: Reported  on 09/26/2021)     PRALUENT 75 MG/ML SOAJ INJECT 75 MLS INTO THE SKIN EVERY 14 (FOURTEEN) DAYS. (Patient not taking: Reported on 09/26/2021) 6 mL 0   Probiotic Product (PROBIOTIC DAILY PO) Take 1 tablet by mouth 2 (two) times daily. (Patient not taking: Reported on 09/26/2021)     rosuvastatin (CRESTOR) 40 MG tablet TAKE ONE-HALF TABLET BY MOUTH ONCE A DAY FOR CHOLESTEROL (Patient not taking: Reported on 09/26/2021)     tadalafil (CIALIS) 10 MG tablet  (Patient not taking: Reported on 09/26/2021)     tamsulosin (FLOMAX) 0.4 MG CAPS capsule 0.4 mg daily after supper. (Patient not taking: Reported on 09/26/2021)     No current facility-administered medications on file prior to visit.   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   Epidermoid cyst of  skin 10/31/2020   Fatigue 11/23/2020   Gastroesophageal reflux disease 10/31/2020   GERD (gastroesophageal reflux disease)    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   Hemorrhage of gastrointestinal tract 10/31/2020   History of basal cell carcinoma (BCC)    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   Joint pain 11/23/2020   Long-term current use of testosterone replacement therapy 11/23/2020   Low libido 11/23/2020   Mixed hyperlipidemia    Osteoarthritis    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   Polyp of colon 11/01/2020   Aug 31, 2015 Entered By: Alycia Rossetti B Comment: colonoscopy 05/2015 - needs repeat  ASAP   Prediabetes    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   Sleep disturbance 11/23/2020   Status post craniotomy 02/10/2020   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   Urge incontinence of urine 11/23/2020   Vitamin D deficiency    Past Surgical History:  Procedure Laterality Date   CRANIOTOMY Left 02/10/2020   Procedure: CRANIOTOMY HEMATOMA EVACUATION SUBDURAL;  Surgeon: Earnie Larsson, MD;  Location: [place];  Service: Neurosurgery;  Laterality: Left;   EYE SURGERY Bilateral 2012   cataracts w lens implants   PROSTATECTOMY  2008   BPH   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   TEMPORAL ARTERY BIOPSY / LIGATION Bilateral 1997   negative   TRANSURETHRAL RESECTION OF PROSTATE  2010    Family History  Problem Relation Age of Onset   Brain cancer Mother    CAD Father    Alcoholism Father    CAD Brother    Lung cancer Brother    CAD Brother    Social History   Socioeconomic History   Marital status: Widowed    Spouse name: Not on file   Number of children: Not on file   Years of education: Not on file   Highest education level: Not on file  Occupational History   Not on file  Tobacco Use   Smoking status: Former    Packs/day: 1.00    Years: 20.00    Pack years: 20.00    Types: Cigarettes    Quit date: 01/06/1967    Years since quitting: 54.7   Smokeless tobacco: Never  Substance and Sexual Activity   Alcohol use: Yes    Comment: occassionally   Drug use: No   Sexual activity: Not on file  Other Topics Concern   Not on file  Social History Narrative   Not on file   Social Determinants of Health   Financial Resource Strain: Not on file  Food Insecurity: Not on file  Transportation Needs: Not on file  Physical Activity: Not on file  Stress: Not on file  Social Connections: Not on file    Review of Systems  Constitutional:  Positive for fatigue. Negative for appetite change and fever.  HENT:  Negative for congestion, ear pain and  sore throat.   Respiratory:  Negative for cough and shortness of breath.   Cardiovascular:  Negative for chest pain and leg swelling.  Gastrointestinal:  Negative for abdominal pain, constipation, diarrhea, nausea and vomiting.  Genitourinary:  Positive for frequency and urgency. Negative for dysuria.  Musculoskeletal:  Positive for arthralgias (right hand). Negative for myalgias.  Neurological:  Negative for dizziness and headaches.  Psychiatric/Behavioral:  Negative for dysphoric mood. The patient is not nervous/anxious.     Objective:  BP (!) 146/78 (BP Location: Left Arm, Patient Position: Sitting)    Pulse 94    Temp 98.5 F (36.9 C) (Oral)  Ht 5\' 11"  (1.803 m)    Wt 247 lb (112 kg)    SpO2 96%    BMI 34.45 kg/m   BP/Weight 09/26/2021 08/09/7515 0/0/1749  Systolic BP 449 675 916  Diastolic BP 78 62 72  Wt. (Lbs) 247 246 248  BMI 34.45 34.31 34.59    Physical Exam Vitals reviewed.  Constitutional:      Appearance: Normal appearance.  Cardiovascular:     Rate and Rhythm: Normal rate and regular rhythm.     Pulses: Normal pulses.     Heart sounds: Normal heart sounds.  Pulmonary:     Effort: Pulmonary effort is normal.     Breath sounds: Normal breath sounds.  Abdominal:     General: Abdomen is flat. Bowel sounds are normal.     Palpations: Abdomen is soft.  Skin:    Capillary Refill: Capillary refill takes less than 2 seconds.  Neurological:     General: No focal deficit present.     Mental Status: He is alert and oriented to person, place, and time.  Psychiatric:        Mood and Affect: Mood normal.        Behavior: Behavior normal.        Lab Results  Component Value Date   WBC 6.1 09/09/2021   HGB 16.6 09/09/2021   HCT 49.3 09/09/2021   PLT 194 09/09/2021   GLUCOSE 119 (H) 09/09/2021   CHOL 71 (L) 09/09/2021   TRIG 116 09/09/2021   HDL 37 (L) 09/09/2021   LDLCALC 13 09/09/2021   ALT 16 09/09/2021   AST 19 09/09/2021   NA 140 09/09/2021   K 4.5  09/09/2021   CL 104 09/09/2021   CREATININE 1.03 09/09/2021   BUN 12 09/09/2021   CO2 22 09/09/2021   TSH 3.800 09/09/2021   INR 1.1 02/10/2020   HGBA1C 5.5 06/09/2021      Assessment & Plan:   1. Other fatigue - Vitamin D, 25-hydroxy  2. Right hand pain - Vitamin D, 25-hydroxy -stop Crestor 40 mg daily     Resume medications STOP CRESTOR Take Flomax (Tamsulosin 0.4 mg ) once daily Monitor BP, keep log Notify office of any elevated or low BP We will call you with vitamin D level Follow-up as needed    Follow-up: As needed  An After Visit Summary was printed and given to the patient.    I,Lauren M Auman,acting as a Education administrator for [employer], NP.,have documented all relevant documentation on the behalf of Rip Harbour, NP,as directed by  Rip Harbour, NP while in the presence of Rip Harbour, NP.   Rip Harbour, NP [place] [phone]

## 2021-09-26 NOTE — Patient Instructions (Addendum)
Resume medications STOP CRESTOR Take Flomax (Tamsulosin 0.4 mg ) once daily Monitor BP, keep log Notify office of any elevated or low BP We will call you with vitamin D level Follow-up as needed   Urinary Frequency, Adult Urinary frequency means urinating more often than usual. You may urinate every 1-2 hours even though you drink a normal amount of fluid and do not have a bladder infection or condition. Although you urinate more often than normal, the total amount of urine produced in a day is normal. With urinary frequency, you may have an urgent need to urinate often. The stress and anxiety of needing to find a bathroom quickly can make this urge worse. This condition may go away on its own, or you may need treatment at home. Home treatment may include bladder training, exercises, taking medicines, or making changes to your diet. Follow these instructions at home: Bladder health Your health care provider will tell you what to do to improve bladder health. You may be told to: Keep a bladder diary. Keep track of: What you eat and drink. How often you urinate. How much you urinate. Follow a bladder training program. This may include: Learning to delay going to the bathroom. Double urinating, also called voiding. This helps if you are not completely emptying your bladder. Scheduled voiding. Do Kegel exercises. Kegel exercises strengthen the muscles that help control urination, which may help the condition.  Eating and drinking Follow instructions from your health care provider about eating or drinking restrictions. You may be told to: Avoid caffeine. Drink fewer fluids, especially alcohol. Avoid drinking in the evening. Avoid foods or drinks that may irritate the bladder. These include coffee, tea, soda, artificial sweeteners, citrus, tomato-based foods, and chocolate. Eat foods that help prevent or treat constipation. Constipation can make urinary frequency worse. You may need to take  these actions to prevent or treat constipation: Drink enough fluid to keep your urine pale yellow. Take over-the-counter or prescription medicines. Eat foods that are high in fiber, such as beans, whole grains, and fresh fruits and vegetables. Limit foods that are high in fat and processed sugars, such as fried or sweet foods. General instructions Take over-the-counter and prescription medicines only as told by your health care provider. Keep all follow-up visits. This is important. Contact a health care provider if: You start urinating more often. You feel pain or irritation when you urinate. You notice blood in your urine. Your urine looks cloudy. You develop a fever. You begin vomiting. Get help right away if: You are unable to urinate. Summary Urinary frequency means urinating more often than usual. With urinary frequency, you may urinate every 1-2 hours even though you drink a normal amount of fluid and do not have a bladder infection or other bladder condition. Your health care provider may recommend that you keep a bladder diary, follow a bladder training program, or make dietary changes. If told by your health care provider, do Kegel exercises to strengthen the muscles that help control urination. Take over-the-counter and prescription medicines only as told by your health care provider. Contact a health care provider if your symptoms do not improve or get worse. This information is not intended to replace advice given to you by your health care provider. Make sure you discuss any questions you have with your health care provider. Document Revised: 02/27/2020 Document Reviewed: 02/27/2020 Elsevier Patient Education  [place].

## 2021-09-27 LAB — VITAMIN D 25 HYDROXY (VIT D DEFICIENCY, FRACTURES): Vit D, 25-Hydroxy: 27.4 ng/mL — ABNORMAL LOW (ref 30.0–100.0)

## 2021-09-29 DIAGNOSIS — Z961 Presence of intraocular lens: Secondary | ICD-10-CM | POA: Diagnosis not present

## 2021-09-29 DIAGNOSIS — H35361 Drusen (degenerative) of macula, right eye: Secondary | ICD-10-CM | POA: Diagnosis not present

## 2021-09-29 DIAGNOSIS — H18513 Endothelial corneal dystrophy, bilateral: Secondary | ICD-10-CM | POA: Diagnosis not present

## 2021-09-30 ENCOUNTER — Other Ambulatory Visit: Payer: Self-pay

## 2021-09-30 MED ORDER — VITAMIN D (ERGOCALCIFEROL) 1.25 MG (50000 UNIT) PO CAPS: 50000.0000 [IU] | ORAL_CAPSULE | ORAL | 3 refills | Status: DC

## 2021-10-24 ENCOUNTER — Telehealth: Payer: Self-pay | Admitting: Family Medicine

## 2021-10-24 NOTE — Telephone Encounter (Signed)
Pt left me a voicemail wanting a referral to [facility] for his left knee. Please advise ?

## 2021-11-02 NOTE — Telephone Encounter (Signed)
Patient is feeling better so he will hold off on making an appointment for his knee pain ?

## 2021-11-03 ENCOUNTER — Other Ambulatory Visit: Payer: Self-pay | Admitting: Family Medicine

## 2021-11-03 DIAGNOSIS — Z0100 Encounter for examination of eyes and vision without abnormal findings: Secondary | ICD-10-CM | POA: Diagnosis not present

## 2021-11-03 DIAGNOSIS — Z961 Presence of intraocular lens: Secondary | ICD-10-CM | POA: Diagnosis not present

## 2021-11-03 NOTE — Telephone Encounter (Signed)
Refill sent to pharmacy.   

## 2021-11-09 DIAGNOSIS — M25562 Pain in left knee: Secondary | ICD-10-CM | POA: Diagnosis not present

## 2021-11-09 DIAGNOSIS — M1712 Unilateral primary osteoarthritis, left knee: Secondary | ICD-10-CM | POA: Diagnosis not present

## 2021-11-17 DIAGNOSIS — M1712 Unilateral primary osteoarthritis, left knee: Secondary | ICD-10-CM | POA: Diagnosis not present

## 2021-11-17 DIAGNOSIS — M25562 Pain in left knee: Secondary | ICD-10-CM | POA: Diagnosis not present

## 2021-11-24 DIAGNOSIS — M25562 Pain in left knee: Secondary | ICD-10-CM | POA: Diagnosis not present

## 2021-11-24 DIAGNOSIS — M1712 Unilateral primary osteoarthritis, left knee: Secondary | ICD-10-CM | POA: Diagnosis not present

## 2021-12-01 DIAGNOSIS — M25562 Pain in left knee: Secondary | ICD-10-CM | POA: Diagnosis not present

## 2021-12-01 DIAGNOSIS — M1712 Unilateral primary osteoarthritis, left knee: Secondary | ICD-10-CM | POA: Diagnosis not present

## 2021-12-03 ENCOUNTER — Other Ambulatory Visit: Payer: Self-pay | Admitting: Family Medicine

## 2021-12-08 DIAGNOSIS — M25562 Pain in left knee: Secondary | ICD-10-CM | POA: Diagnosis not present

## 2021-12-08 DIAGNOSIS — M1712 Unilateral primary osteoarthritis, left knee: Secondary | ICD-10-CM | POA: Diagnosis not present

## 2021-12-09 ENCOUNTER — Other Ambulatory Visit: Payer: Self-pay | Admitting: Family Medicine

## 2021-12-09 DIAGNOSIS — I10 Essential (primary) hypertension: Secondary | ICD-10-CM

## 2021-12-14 ENCOUNTER — Ambulatory Visit (INDEPENDENT_AMBULATORY_CARE_PROVIDER_SITE_OTHER): Payer: Medicare HMO | Admitting: Family Medicine

## 2021-12-14 ENCOUNTER — Encounter: Payer: Self-pay | Admitting: Family Medicine

## 2021-12-14 VITALS — BP 130/80 | HR 80 | Temp 97.1°F | Resp 16 | Ht 71.0 in | Wt 244.0 lb

## 2021-12-14 DIAGNOSIS — E6609 Other obesity due to excess calories: Secondary | ICD-10-CM

## 2021-12-14 DIAGNOSIS — Z6834 Body mass index (BMI) 34.0-34.9, adult: Secondary | ICD-10-CM | POA: Diagnosis not present

## 2021-12-14 DIAGNOSIS — E782 Mixed hyperlipidemia: Secondary | ICD-10-CM

## 2021-12-14 DIAGNOSIS — M79641 Pain in right hand: Secondary | ICD-10-CM

## 2021-12-14 DIAGNOSIS — T466X5A Adverse effect of antihyperlipidemic and antiarteriosclerotic drugs, initial encounter: Secondary | ICD-10-CM

## 2021-12-14 DIAGNOSIS — M79642 Pain in left hand: Secondary | ICD-10-CM | POA: Diagnosis not present

## 2021-12-14 DIAGNOSIS — J449 Chronic obstructive pulmonary disease, unspecified: Secondary | ICD-10-CM

## 2021-12-14 DIAGNOSIS — I700 Atherosclerosis of aorta: Secondary | ICD-10-CM

## 2021-12-14 DIAGNOSIS — I10 Essential (primary) hypertension: Secondary | ICD-10-CM | POA: Diagnosis not present

## 2021-12-14 DIAGNOSIS — H903 Sensorineural hearing loss, bilateral: Secondary | ICD-10-CM

## 2021-12-14 DIAGNOSIS — M791 Myalgia, unspecified site: Secondary | ICD-10-CM

## 2021-12-14 DIAGNOSIS — N529 Male erectile dysfunction, unspecified: Secondary | ICD-10-CM | POA: Diagnosis not present

## 2021-12-14 DIAGNOSIS — R7301 Impaired fasting glucose: Secondary | ICD-10-CM | POA: Diagnosis not present

## 2021-12-14 DIAGNOSIS — I152 Hypertension secondary to endocrine disorders: Secondary | ICD-10-CM | POA: Insufficient documentation

## 2021-12-14 HISTORY — DX: Essential (primary) hypertension: I10

## 2021-12-14 HISTORY — DX: Myalgia, unspecified site: M79.10

## 2021-12-14 MED ORDER — LOSARTAN POTASSIUM 50 MG PO TABS: 50.0000 mg | ORAL_TABLET | Freq: Every day | ORAL | 1 refills | Status: DC

## 2021-12-14 MED ORDER — PREDNISONE 10 MG PO TABS: 10.0000 mg | ORAL_TABLET | Freq: Every day | ORAL | 0 refills | Status: DC

## 2021-12-14 NOTE — Assessment & Plan Note (Signed)
The current medical regimen is effective;  continue present plan and medications. ?Continue advair ?

## 2021-12-14 NOTE — Assessment & Plan Note (Signed)
Continue praluent.  ?Continue healthy diet. ?

## 2021-12-14 NOTE — Patient Instructions (Signed)
Refer to rheumatology.  ?Start on prednisone 10 mg daily x 10 days.  ?Patient to call back and update on his hands.  ?

## 2021-12-14 NOTE — Assessment & Plan Note (Signed)
Intolerant to statins. 

## 2021-12-14 NOTE — Assessment & Plan Note (Signed)
On praluent. ?Continue to work on eating a healthy diet and exercise.  ?Labs drawn today.  ? ?

## 2021-12-14 NOTE — Assessment & Plan Note (Addendum)
Repeat labs.  ?Refer to rheumatology.  ?Start on prednisone 10 mg daily x 10 days.  ?Patient to call back and update on his hands.  ?

## 2021-12-14 NOTE — Progress Notes (Signed)
? ?Subjective:  ?Patient ID: Christian Deem., male    DOB: 28-Feb-1941  Age: 81 y.o. MRN: [id number] ? ?Chief Complaint  ?Patient presents with  ? Diabetes  ? COPD  ? Hyperlipidemia  ? ?HPI ?Hyperlipidemia: ?Current medications: Intolerant to crestor or zetia. Ran out of Fish oil a while ago.  Praluent injection 75 mg/ml once every 2 weeks.  ? ?Hypertension: ?Current medications: Losartan '50mg'$  once daily. UTD. ? ?COPD: ?Current medications: on advair 1 puff bid.  ? ?Aortic Atherosclerosis: ?Current medications: Praluent injection '75mg'$ /ml once every two weeks. ? ?GERD: on omeprazole 20 mg daily.  ? ?BPH: detrol LA 4 mg daily. Tamsulosin 0.4 mg before bed.  ? ?ERECTILE dysfunction: on cialis. Which helps.  ?Diet: healthy. ?Exercise: no ?  ?BL Hand pain and stiffness. Worsened. Started in right hand and now is in both. Unable to pick up small items, like nails. CCP ab test was elevated last summer. Rest of arthritis panel was negative.  ? ?Above HPI reviewed and updated.  ? ?Current Outpatient Medications on File Prior to Visit  ?Medication Sig Dispense Refill  ? fluticasone-salmeterol (ADVAIR) 250-50 MCG/ACT AEPB Inhale 1 puff into the lungs in the morning and at bedtime.    ? loratadine (CLARITIN) 10 MG tablet     ? metFORMIN (GLUCOPHAGE) 500 MG tablet TAKE 1 TABLET (500 MG TOTAL) BY MOUTH DAILY. 90 tablet 0  ? naproxen (NAPROSYN) 500 MG tablet TAKE 1 TABLET (500 MG TOTAL) BY MOUTH 2 (TWO) TIMES DAILY WITH A MEAL. AS NEEDED FOR PAIN. 60 tablet 0  ? omeprazole (PRILOSEC) 20 MG capsule Take 20 mg by mouth daily.    ? PRALUENT 75 MG/ML SOAJ INJECT 75 MLS INTO THE SKIN EVERY 14 (FOURTEEN) DAYS. 6 mL 0  ? Probiotic Product (PROBIOTIC DAILY PO) Take 1 tablet by mouth 2 (two) times daily.    ? tadalafil (CIALIS) 10 MG tablet     ? tamsulosin (FLOMAX) 0.4 MG CAPS capsule 0.4 mg daily after supper.    ? tolterodine (DETROL LA) 4 MG 24 hr capsule Take 4 mg by mouth daily.    ? Omega-3 Fatty Acids (FISH OIL) 1000 MG CPDR  Take 1,000 mg by mouth in the morning and at bedtime. (Patient not taking: Reported on 09/26/2021)    ? ?No current facility-administered medications on file prior to visit.  ? ?Past Medical History:  ?Diagnosis Date  ? Actinic keratosis 10/31/2020  ? Benign prostatic hyperplasia 10/31/2020  ? Mar 13, 2014 Entered By: Alycia Rossetti B Comment: s/p TURP 2010  ? Body mass index (BMI) 33.0-33.9, adult 11/23/2020  ? BPPV (benign paroxysmal positional vertigo)   ? Calculus of kidney   ? Calculus of kidney   ? Cancer ([place]) 2015  ? skin cancer removed off left forearm   ? Cataract extraction status 10/31/2020  ? Apr 17, 2009 Entered By: Alinda Dooms Comment: bilateral  ? Closed fracture of body of sternum 11/10/2019  ? Contusion of right thigh 11/10/2019  ? 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 Epidermoid cyst of skin 10/31/2020  ? Fatigue 11/23/2020  ? Gastroesophageal reflux disease 10/31/2020  ? GERD (gastroesophageal reflux disease)   ? 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  ? Hemorrhage of gastrointestinal tract 10/31/2020  ? History of basal cell carcinoma (BCC)   ? 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 Joint pain 11/23/2020  ? Long-term current use of testosterone replacement therapy 11/23/2020  ? Low libido 11/23/2020  ? Mixed hyperlipidemia   ? Osteoarthritis   ?  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 Polyp of colon 11/01/2020  ? Aug 31, 2015 Entered By: Alycia Rossetti B Comment: colonoscopy 05/2015 - needs repeat ASAP  ? Prediabetes   ? SDH (subdural hematoma) ([place]) 02/26/2020  ? Sleep disturbance 11/23/2020  ? Status post craniotomy 02/10/2020  ? 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 Urge incontinence of urine 11/23/2020  ? Vitamin D deficiency   ? ?Past Surgical History:  ?Procedure Laterality Date  ? CRANIOTOMY Left 02/10/2020  ? Procedure: CRANIOTOMY HEMATOMA EVACUATION SUBDURAL;  Surgeon: Earnie Larsson, MD;  Location: [place];  Service: Neurosurgery;  Laterality: Left;  ? EYE SURGERY  Bilateral 2012  ? cataracts w lens implants  ? PROSTATECTOMY  2008  ? BPH  ? 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  ? TEMPORAL ARTERY BIOPSY / LIGATION Bilateral 1997  ? negative  ? TRANSURETHRAL RESECTION OF PROSTATE  2010  ?  ?Family History  ?Problem Relation Age of Onset  ? Brain cancer Mother   ? CAD Father   ? Alcoholism Father   ? CAD Brother   ? Lung cancer Brother   ? CAD Brother   ? ?Social History  ? ?Socioeconomic History  ? Marital status: Widowed  ?  Spouse name: Not on file  ? Number of children: Not on file  ? Years of education: Not on file  ? Highest education level: Not on file  ?Occupational History  ? Not on file  ?Tobacco Use  ? Smoking status: Former  ?  Packs/day: 1.00  ?  Years: 20.00  ?  Pack years: 20.00  ?  Types: Cigarettes  ?  Quit date: 01/06/1967  ?  Years since quitting: 54.9  ? Smokeless tobacco: Never  ?Substance and Sexual Activity  ? Alcohol use: Yes  ?  Comment: occassionally  ? Drug use: No  ? Sexual activity: Not on file  ?Other Topics Concern  ? Not on file  ?Social History Narrative  ? Not on file  ? ?Social Determinants of Health  ? ?Financial Resource Strain: Not on file  ?Food Insecurity: Not on file  ?Transportation Needs: Not on file  ?Physical Activity: Not on file  ?Stress: Not on file  ?Social Connections: Not on file  ? ? ?Review of Systems  ?Constitutional:  Negative for appetite change, fatigue and fever.  ?HENT:  Negative for congestion, ear pain, sinus pressure and sore throat.   ?Eyes:  Negative for pain.  ?Respiratory:  Negative for cough, shortness of breath and wheezing.   ?Cardiovascular:  Negative for chest pain and palpitations.  ?Gastrointestinal:  Negative for abdominal pain, constipation, diarrhea, nausea and vomiting.  ?Genitourinary:  Negative for dysuria and frequency.  ?Musculoskeletal:  Positive for arthralgias (hand pain) and back pain (lower back pain). Negative for joint swelling and myalgias.  ?Skin:  Negative for rash.  ?Neurological:  Positive  for headaches (sharp pain in right-side). Negative for dizziness and weakness.  ?Psychiatric/Behavioral:  Negative for dysphoric mood. The patient is not nervous/anxious.   ? ? ?Objective:  ?BP 130/80   Pulse 80   Temp (!) 97.1 ?F (36.2 ?C)   Resp 16   Ht '5\' 11"'$  (1.803 m)   Wt 244 lb (110.7 kg)   BMI 34.03 kg/m?  ? ? ?  12/14/2021  ?  8:20 AM 09/26/2021  ?  3:37 PM 09/14/2021  ? 11:39 AM  ?BP/Weight  ?Systolic BP 308 657 846  ?Diastolic BP 80 78 62  ?  Wt. (Lbs) 244 247 246  ?BMI 34.03 kg/m2 34.45 kg/m2 34.31 kg/m2  ? ? ?Physical Exam ?Vitals reviewed.  ?Constitutional:   ?   Appearance: Normal appearance. He is obese.  ?Neck:  ?   Vascular: No carotid bruit.  ?Cardiovascular:  ?   Rate and Rhythm: Regular rhythm.  ?   Pulses: Normal pulses.  ?   Heart sounds: Normal heart sounds.  ?Pulmonary:  ?   Effort: Pulmonary effort is normal.  ?   Breath sounds: Normal breath sounds.  ?Abdominal:  ?   General: Bowel sounds are normal.  ?   Palpations: Abdomen is soft.  ?   Tenderness: There is no abdominal tenderness.  ?Musculoskeletal:     ?   General: Swelling present.  ?   Comments: Hands BL Stiffness. Unable to make fists.   ?Neurological:  ?   Mental Status: He is alert and oriented to person, place, and time. Mental status is at baseline.  ?Psychiatric:     ?   Mood and Affect: Mood normal.     ?   Behavior: Behavior normal.  ? ? ?Diabetic Foot Exam - Simple   ?No data filed ?  ?  ? ?Lab Results  ?Component Value Date  ? WBC 6.1 09/09/2021  ? HGB 16.6 09/09/2021  ? HCT 49.3 09/09/2021  ? PLT 194 09/09/2021  ? GLUCOSE 119 (H) 09/09/2021  ? CHOL 71 (L) 09/09/2021  ? TRIG 116 09/09/2021  ? HDL 37 (L) 09/09/2021  ? [place] 13 09/09/2021  ? ALT 16 09/09/2021  ? AST 19 09/09/2021  ? NA 140 09/09/2021  ? K 4.5 09/09/2021  ? CL 104 09/09/2021  ? CREATININE 1.03 09/09/2021  ? BUN 12 09/09/2021  ? CO2 22 09/09/2021  ? TSH 3.800 09/09/2021  ? INR 1.1 02/10/2020  ? HGBA1C 5.5 06/09/2021  ? ? ? ? ?Assessment & Plan:  ? ?Problem  List Items Addressed This Visit   ? ?  ? Cardiovascular and Mediastinum  ? Essential hypertension, benign - Primary  ?  Well controlled.  ?No changes to medicines. Losartan '50mg'$  once daily. ?Continue to work o

## 2021-12-14 NOTE — Assessment & Plan Note (Addendum)
Well controlled.  No changes to medicines. Losartan 50mg once daily.  Continue to work on eating a healthy diet and exercise.  Labs drawn today.   

## 2021-12-15 ENCOUNTER — Telehealth: Payer: Self-pay

## 2021-12-15 LAB — COMPREHENSIVE METABOLIC PANEL
ALT: 21 IU/L (ref 0–44)
AST: 19 IU/L (ref 0–40)
Albumin/Globulin Ratio: 1.8 (ref 1.2–2.2)
Albumin: 4.4 g/dL (ref 3.6–4.6)
Alkaline Phosphatase: 57 IU/L (ref 44–121)
BUN/Creatinine Ratio: 17 (ref 10–24)
BUN: 17 mg/dL (ref 8–27)
Bilirubin Total: 0.7 mg/dL (ref 0.0–1.2)
CO2: 22 mmol/L (ref 20–29)
Calcium: 9.6 mg/dL (ref 8.6–10.2)
Chloride: 103 mmol/L (ref 96–106)
Creatinine, Ser: 1 mg/dL (ref 0.76–1.27)
Globulin, Total: 2.5 g/dL (ref 1.5–4.5)
Glucose: 149 mg/dL — ABNORMAL HIGH (ref 70–99)
Potassium: 4.3 mmol/L (ref 3.5–5.2)
Sodium: 139 mmol/L (ref 134–144)
Total Protein: 6.9 g/dL (ref 6.0–8.5)
eGFR: 75 mL/min/{1.73_m2} (ref >59)

## 2021-12-15 LAB — CBC WITH DIFFERENTIAL/PLATELET
Basophils Absolute: 0 10*3/uL (ref 0.0–0.2)
Basos: 1 %
EOS (ABSOLUTE): 0.2 10*3/uL (ref 0.0–0.4)
Eos: 5 %
Hematocrit: 48.6 % (ref 37.5–51.0)
Hemoglobin: 16.3 g/dL (ref 13.0–17.7)
Immature Grans (Abs): 0 10*3/uL (ref 0.0–0.1)
Immature Granulocytes: 0 %
Lymphocytes Absolute: 1.3 10*3/uL (ref 0.7–3.1)
Lymphs: 28 %
MCH: 30 pg (ref 26.6–33.0)
MCHC: 33.5 g/dL (ref 31.5–35.7)
MCV: 89 fL (ref 79–97)
Monocytes Absolute: 0.4 10*3/uL (ref 0.1–0.9)
Monocytes: 9 %
Neutrophils Absolute: 2.6 10*3/uL (ref 1.4–7.0)
Neutrophils: 57 %
Platelets: 174 10*3/uL (ref 150–450)
RBC: 5.44 x10E6/uL (ref 4.14–5.80)
RDW: 13 % (ref 11.6–15.4)
WBC: 4.5 10*3/uL (ref 3.4–10.8)

## 2021-12-15 LAB — LIPID PANEL
Chol/HDL Ratio: 3.5 ratio (ref 0.0–5.0)
Cholesterol, Total: 156 mg/dL (ref 100–199)
HDL: 44 mg/dL (ref >39)
LDL Chol Calc (NIH): 72 mg/dL (ref 0–99)
Triglycerides: 245 mg/dL — ABNORMAL HIGH (ref 0–149)
VLDL Cholesterol Cal: 40 mg/dL (ref 5–40)

## 2021-12-15 LAB — SEDIMENTATION RATE: Sed Rate: 19 mm/hr (ref 0–30)

## 2021-12-15 LAB — CARDIOVASCULAR RISK ASSESSMENT

## 2021-12-15 LAB — RHEUMATOID FACTOR: Rheumatoid fact SerPl-aCnc: 11.3 IU/mL (ref <14.0)

## 2021-12-15 LAB — CYCLIC CITRUL PEPTIDE ANTIBODY, IGG/IGA: Cyclic Citrullin Peptide Ab: 39 units — ABNORMAL HIGH (ref 0–19)

## 2021-12-15 LAB — URIC ACID: Uric Acid: 7.2 mg/dL (ref 3.8–8.4)

## 2021-12-15 LAB — C-REACTIVE PROTEIN: CRP: <1 mg/L (ref 0–10)

## 2021-12-15 NOTE — Telephone Encounter (Signed)
LVM for pt to call back as soon as possible.  ? ?RE: schedule AWVS last on on 12/30/2019 ?

## 2021-12-20 LAB — SPECIMEN STATUS REPORT

## 2021-12-20 LAB — HGB A1C W/O EAG: Hgb A1c MFr Bld: 6.2 % — ABNORMAL HIGH (ref 4.8–5.6)

## 2021-12-29 ENCOUNTER — Ambulatory Visit (INDEPENDENT_AMBULATORY_CARE_PROVIDER_SITE_OTHER): Payer: Medicare HMO

## 2021-12-29 VITALS — BP 122/78 | HR 76 | Resp 16 | Ht 71.0 in | Wt 247.0 lb

## 2021-12-29 DIAGNOSIS — Z6834 Body mass index (BMI) 34.0-34.9, adult: Secondary | ICD-10-CM

## 2021-12-29 DIAGNOSIS — Z0000 Encounter for general adult medical examination without abnormal findings: Secondary | ICD-10-CM | POA: Diagnosis not present

## 2021-12-29 NOTE — Patient Instructions (Signed)
[name] , Thank you for taking time to come for your Medicare Wellness Visit. I appreciate your ongoing commitment to your health goals. Please review the following plan we discussed and let me know if I can assist you in the future.   Screening recommendations/referrals: Recommended yearly ophthalmology/optometry visit for glaucoma screening and checkup Recommended yearly dental visit for hygiene and checkup  Vaccinations: Influenza vaccine: Due Fall 2023 Pneumococcal vaccine: Complete Tdap vaccine: Due Sept 2029 Shingles vaccine: Complete    Advanced directives: Please bring a copy for your medical record   Preventive Care 82 Years and Older, Male Preventive care refers to lifestyle choices and visits with your health care provider that can promote health and wellness. What does preventive care include? A yearly physical exam. This is also called an annual well check. Dental exams once or twice a year. Routine eye exams. Ask your health care provider how often you should have your eyes checked. Personal lifestyle choices, including: Daily care of your teeth and gums. Regular physical activity. Eating a healthy diet. Avoiding tobacco and drug use. Limiting alcohol use. Practicing safe sex. Taking low doses of aspirin every day. Taking vitamin and mineral supplements as recommended by your health care provider. What happens during an annual well check? The services and screenings done by your health care provider during your annual well check will depend on your age, overall health, lifestyle risk factors, and family history of disease. Counseling  Your health care provider may ask you questions about your: Alcohol use. Tobacco use. Drug use. Emotional well-being. Home and relationship well-being. Sexual activity. Eating habits. History of falls. Memory and ability to understand (cognition). Work and work Statistician. Screening  You may have the following tests or  measurements: Height, weight, and BMI. Blood pressure. Lipid and cholesterol levels. These may be checked every 5 years, or more frequently if you are over 24 years old. Skin check. Lung cancer screening. You may have this screening every year starting at age 20 if you have a 30-pack-year history of smoking and currently smoke or have quit within the past 15 years. Fecal occult blood test (FOBT) of the stool. You may have this test every year starting at age 21. Flexible sigmoidoscopy or colonoscopy. You may have a sigmoidoscopy every 5 years or a colonoscopy every 10 years starting at age 71. Prostate cancer screening. Recommendations will vary depending on your family history and other risks. Hepatitis C blood test. Hepatitis B blood test. Sexually transmitted disease (STD) testing. Diabetes screening. This is done by checking your blood sugar (glucose) after you have not eaten for a while (fasting). You may have this done every 1-3 years. Abdominal aortic aneurysm (AAA) screening. You may need this if you are a current or former smoker. Osteoporosis. You may be screened starting at age 68 if you are at high risk. Talk with your health care provider about your test results, treatment options, and if necessary, the need for more tests. Vaccines  Your health care provider may recommend certain vaccines, such as: Influenza vaccine. This is recommended every year. Tetanus, diphtheria, and acellular pertussis (Tdap, Td) vaccine. You may need a Td booster every 10 years. Zoster vaccine. You may need this after age 24. Pneumococcal 13-valent conjugate (PCV13) vaccine. One dose is recommended after age 40. Pneumococcal polysaccharide (PPSV23) vaccine. One dose is recommended after age 89. Talk to your health care provider about which screenings and vaccines you need and how often you need them. This information is  not intended to replace advice given to you by your health care provider. Make sure  you discuss any questions you have with your health care provider. Document Released: 08/20/2015 Document Revised: 04/12/2016 Document Reviewed: 05/25/2015 Elsevier Interactive Patient Education  2017 [employer] Prevention in the Home Falls can cause injuries. They can happen to people of all ages. There are many things you can do to make your home safe and to help prevent falls. What can I do on the outside of my home? Regularly fix the edges of walkways and driveways and fix any cracks. Remove anything that might make you trip as you walk through a door, such as a raised step or threshold. Trim any bushes or trees on the path to your home. Use bright outdoor lighting. Clear any walking paths of anything that might make someone trip, such as rocks or tools. Regularly check to see if handrails are loose or broken. Make sure that both sides of any steps have handrails. Any raised decks and porches should have guardrails on the edges. Have any leaves, snow, or ice cleared regularly. Use sand or salt on walking paths during winter. Clean up any spills in your garage right away. This includes oil or grease spills. What can I do in the bathroom? Use night lights. Install grab bars by the toilet and in the tub and shower. Do not use towel bars as grab bars. Use non-skid mats or decals in the tub or shower. If you need to sit down in the shower, use a plastic, non-slip stool. Keep the floor dry. Clean up any water that spills on the floor as soon as it happens. Remove soap buildup in the tub or shower regularly. Attach bath mats securely with double-sided non-slip rug tape. Do not have throw rugs and other things on the floor that can make you trip. What can I do in the bedroom? Use night lights. Make sure that you have a light by your bed that is easy to reach. Do not use any sheets or blankets that are too big for your bed. They should not hang down onto the floor. Have a firm  chair that has side arms. You can use this for support while you get dressed. Do not have throw rugs and other things on the floor that can make you trip. What can I do in the kitchen? Clean up any spills right away. Avoid walking on wet floors. Keep items that you use a lot in easy-to-reach places. If you need to reach something above you, use a strong step stool that has a grab bar. Keep electrical cords out of the way. Do not use floor polish or wax that makes floors slippery. If you must use wax, use non-skid floor wax. Do not have throw rugs and other things on the floor that can make you trip. What can I do with my stairs? Do not leave any items on the stairs. Make sure that there are handrails on both sides of the stairs and use them. Fix handrails that are broken or loose. Make sure that handrails are as long as the stairways. Check any carpeting to make sure that it is firmly attached to the stairs. Fix any carpet that is loose or worn. Avoid having throw rugs at the top or bottom of the stairs. If you do have throw rugs, attach them to the floor with carpet tape. Make sure that you have a light switch at the top of the  stairs and the bottom of the stairs. If you do not have them, ask someone to add them for you. What else can I do to help prevent falls? Wear shoes that: Do not have high heels. Have rubber bottoms. Are comfortable and fit you well. Are closed at the toe. Do not wear sandals. If you use a stepladder: Make sure that it is fully opened. Do not climb a closed stepladder. Make sure that both sides of the stepladder are locked into place. Ask someone to hold it for you, if possible. Clearly mark and make sure that you can see: Any grab bars or handrails. First and last steps. Where the edge of each step is. Use tools that help you move around (mobility aids) if they are needed. These include: Canes. Walkers. Scooters. Crutches. Turn on the lights when you go  into a dark area. Replace any light bulbs as soon as they burn out. Set up your furniture so you have a clear path. Avoid moving your furniture around. If any of your floors are uneven, fix them. If there are any pets around you, be aware of where they are. Review your medicines with your doctor. Some medicines can make you feel dizzy. This can increase your chance of falling. Ask your doctor what other things that you can do to help prevent falls. This information is not intended to replace advice given to you by your health care provider. Make sure you discuss any questions you have with your health care provider. Document Released: 05/20/2009 Document Revised: 12/30/2015 Document Reviewed: 08/28/2014 Elsevier Interactive Patient Education  2017 [employer].

## 2021-12-29 NOTE — Progress Notes (Signed)
Subjective:   Christian Lowe. is a 81 y.o. male who presents for Medicare Annual/Subsequent preventive examination.  This wellness visit is conducted by a nurse.  The patient's medications were reviewed and reconciled since the patient's last visit.  History details were provided by the patient.  The history appears to be reliable.    Medical History: Patient history and Family history was reviewed  Medications, Allergies, and preventative health maintenance was reviewed and updated.   Cardiac Risk Factors include: advanced age (>29mn, >>25women);diabetes mellitus;dyslipidemia;male gender;obesity (BMI >30kg/m2)     Objective:    Today's Vitals   12/29/21 1324  BP: 122/78  Pulse: 76  Resp: 16  Weight: 247 lb (112 kg)  Height: '5\' 11"'$  (1.803 m)  PainSc: 0-No pain   Body mass index is 34.45 kg/m.     02/11/2020    1:00 AM 12/30/2019    3:29 PM 01/10/2016    8:00 AM  Advanced Directives  Does Patient Have a Medical Advance Directive? No Yes Yes  Type of [place]   Does patient want to make changes to medical advance directive?  Yes (MAU/Ambulatory/Procedural Areas - Information given)   Copy of [place]in Chart?  No - copy requested No - copy requested  Would patient like information on creating a medical advance directive? No - Patient declined      Current Medications (verified) Outpatient Encounter Medications as of 12/29/2021  Medication Sig   fluticasone-salmeterol (ADVAIR) 250-50 MCG/ACT AEPB Inhale 1 puff into the lungs in the morning and at bedtime.   loratadine (CLARITIN) 10 MG tablet    losartan (COZAAR) 50 MG tablet Take 1 tablet (50 mg total) by mouth daily.   metFORMIN (GLUCOPHAGE) 500 MG tablet TAKE 1 TABLET (500 MG TOTAL) BY MOUTH DAILY.   naproxen (NAPROSYN) 500 MG tablet TAKE 1 TABLET (500 MG TOTAL) BY MOUTH 2 (TWO) TIMES DAILY WITH A MEAL. AS NEEDED FOR PAIN.   Omega-3 Fatty Acids  (FISH OIL) 1000 MG CPDR Take 1,000 mg by mouth in the morning and at bedtime.   omeprazole (PRILOSEC) 20 MG capsule Take 20 mg by mouth daily.   PRALUENT 75 MG/ML SOAJ INJECT 75 MLS INTO THE SKIN EVERY 14 (FOURTEEN) DAYS.   predniSONE (DELTASONE) 10 MG tablet Take 1 tablet (10 mg total) by mouth daily with breakfast.   Probiotic Product (PROBIOTIC DAILY PO) Take 1 tablet by mouth 2 (two) times daily.   tadalafil (CIALIS) 10 MG tablet    tamsulosin (FLOMAX) 0.4 MG CAPS capsule 0.4 mg daily after supper.   tolterodine (DETROL LA) 4 MG 24 hr capsule Take 4 mg by mouth daily.   trospium (SANCTURA) 20 MG tablet TAKE ONE-HALF TABLET BY MOUTH TWICE A DAY .  IF NO BOTHERSOME SIDE EFFECTS, INCREASE TO 1 TABLET TWICE DAILY IF NEEDED FOR OVERACTIVE BLADDER.   Vitamin D, Ergocalciferol, (DRISDOL) 1.25 MG (50000 UNIT) CAPS capsule Take 50,000 Units by mouth once a week.   No facility-administered encounter medications on file as of 12/29/2021.    Allergies (verified) Atorvastatin, Codeine, Niacin, No known allergies, Piroxicam, Pravastatin, Statins, and Zocor [simvastatin]   History: Past Medical History:  Diagnosis Date   Actinic keratosis 10/31/2020   Benign prostatic hyperplasia 10/31/2020   Mar 13, 2014 Entered By: MAlycia RossettiB Comment: s/p TURP 2010   Body mass index (BMI) 33.0-33.9, adult 11/23/2020   BPPV (benign paroxysmal positional vertigo)    Calculus  of kidney    Calculus of kidney    Cancer ([place]) 2015   skin cancer removed off left forearm    Cataract extraction status 10/31/2020   Apr 17, 2009 Entered By: Alinda Dooms Comment: bilateral   Closed fracture of body of sternum 11/10/2019   Contusion of right thigh 11/10/2019   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   Epidermoid cyst of skin 10/31/2020   Fatigue 11/23/2020   Gastroesophageal reflux disease 10/31/2020   GERD (gastroesophageal reflux disease)    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   Hemorrhage of gastrointestinal tract  10/31/2020   History of basal cell carcinoma (BCC)    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   Joint pain 11/23/2020   Long-term current use of testosterone replacement therapy 11/23/2020   Low libido 11/23/2020   Mixed hyperlipidemia    Osteoarthritis    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   Polyp of colon 11/01/2020   Aug 31, 2015 Entered By: Alycia Rossetti B Comment: colonoscopy 05/2015 - needs repeat ASAP   Prediabetes    SDH (subdural hematoma) ([place]) 02/26/2020   Sleep disturbance 11/23/2020   Status post craniotomy 02/10/2020   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   Urge incontinence of urine 11/23/2020   Vitamin D deficiency    Past Surgical History:  Procedure Laterality Date   CRANIOTOMY Left 02/10/2020   Procedure: CRANIOTOMY HEMATOMA EVACUATION SUBDURAL;  Surgeon: Earnie Larsson, MD;  Location: [facility];  Service: Neurosurgery;  Laterality: Left;   EYE SURGERY Bilateral 2012   cataracts w lens implants   PROSTATECTOMY  2008   BPH   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   TEMPORAL ARTERY BIOPSY / LIGATION Bilateral 1997   negative   TRANSURETHRAL RESECTION OF PROSTATE  2010   Family History  Problem Relation Age of Onset   Brain cancer Mother    CAD Father    Alcoholism Father    CAD Brother    Lung cancer Brother    CAD Brother    Social History   Socioeconomic History   Marital status: Widowed    Spouse name: Not on file   Number of children: Not on file   Years of education: Not on file   Highest education level: Not on file  Occupational History   Not on file  Tobacco Use   Smoking status: Former    Packs/day: 1.00    Years: 20.00    Pack years: 20.00    Types: Cigarettes    Quit date: 01/06/1967    Years since quitting: 55.0   Smokeless tobacco: Never  Substance and Sexual Activity   Alcohol use: Yes    Comment: occassionally   Drug use: No   Sexual activity: Not on file  Other Topics Concern   Not on file  Social  History Narrative   Not on file   Social Determinants of Health   Financial Resource Strain: Not on file  Food Insecurity: No Food Insecurity   Worried About Running Out of Food in the Last Year: Never true   [address] in the Last Year: Never true  Transportation Needs: No Transportation Needs   Lack of Transportation (Medical): No   Lack of Transportation (Non-Medical): No  Physical Activity: Not on file  Stress: Not on file  Social Connections: Not on file    Tobacco Counseling Counseling given: Patient does not use tobacco products   Clinical Intake:  Pre-visit preparation completed: Yes Pain : No/denies pain  Pain Score: 0-No pain   BMI - recorded: 34.45 Nutritional Status: BMI > 30  Obese Nutritional Risks: None Diabetes: Yes (A1C 6.2) CBG done?: No Did pt. bring in CBG monitor from home?: No How often do you need to have someone help you when you read instructions, pamphlets, or other written materials from your doctor or pharmacy?: 1 - Never Interpreter Needed?: No   Activities of Daily Living    12/29/2021    2:50 PM  In your present state of health, do you have any difficulty performing the following activities:  Hearing? 1  Comment corrected with hearing aids  Vision? 0  Difficulty concentrating or making decisions? 0  Walking or climbing stairs? 0  Dressing or bathing? 0  Doing errands, shopping? 0  Preparing Food and eating ? N  Using the Toilet? N  In the past six months, have you accidently leaked urine? N  Do you have problems with loss of bowel control? N  Managing your Medications? N  Managing your Finances? N  Housekeeping or managing your Housekeeping? N    Patient Care Team: Rochel Brome, MD as PCP - General (Family Medicine) Lane Hacker, [facility] as Pharmacist (Pharmacist) [facility], [place]     Assessment:   This is a routine wellness examination for [employer].  Dietary issues and exercise activities discussed: Current  Exercise Habits: The patient does not participate in regular exercise at present (patient is active around the home), Exercise limited by: None identified   Depression Screen    12/29/2021    2:47 PM 12/14/2021    8:23 AM 09/09/2021    8:13 AM 07/08/2021   11:25 AM 06/09/2021    7:58 AM 11/01/2020    7:57 AM 12/30/2019    3:31 PM  PHQ 2/9 Scores  PHQ - 2 Score 0 0 0 0 0 0 0    Fall Risk    12/29/2021    2:48 PM 12/14/2021    8:23 AM 09/09/2021    8:13 AM 07/08/2021   11:25 AM 06/09/2021    7:57 AM  Fall Risk   Falls in the past year? 0 0  0 0  Number falls in past yr: 0 0 0 0 0  Injury with Fall? 0 0 0 0 0  Risk for fall due to : No Fall Risks      Follow up Falls evaluation completed;Education provided Falls evaluation completed Falls evaluation completed Falls evaluation completed Falls evaluation completed    [place]:  Any stairs in or around the home? Yes  If so, are there any without handrails? No  Home free of loose throw rugs in walkways, pet beds, electrical cords, etc? Yes  Adequate lighting in your home to reduce risk of falls? Yes   ASSISTIVE DEVICES UTILIZED TO PREVENT FALLS:  Life alert? No  Use of a cane, walker or w/c? No  Grab bars in the bathroom? Yes  Shower chair or bench in shower? No  Elevated toilet seat or a handicapped toilet? No   Gait steady and fast without use of assistive device  Cognitive Function:        12/29/2021    2:53 PM 12/30/2019    3:32 PM  6CIT Screen  What Year? 0 points 0 points  What month? 0 points 0 points  What time? 0 points 0 points  Count back from 20 0 points 0 points  Months in reverse 0 points 0  points  Repeat phrase 0 points 0 points  Total Score 0 points 0 points    Immunizations Immunization History  Administered Date(s) Administered   Fluad Quad(high Dose 65+) 04/08/2020, 06/09/2021   Influenza Split 05/08/2015, 04/26/2016   Influenza, High Dose Seasonal PF 05/07/2017    Influenza, Seasonal, Injecte, Preservative Fre 05/05/2015   Influenza-Unspecified 06/07/2001, 05/07/2002, 05/07/2004, 06/07/2006, 06/08/2007, 05/25/2008, 05/16/2009, 05/07/2010, 05/07/2013, 04/07/2014, 05/21/2021   Moderna Covid-19 Vaccine Bivalent Booster 20yr & up 11/28/2021   PFIZER Comirnaty(Gray Top)Covid-19 Tri-Sucrose Vaccine 12/15/2020   PFIZER(Purple Top)SARS-COV-2 Vaccination 07/28/2020, 08/19/2020   PNEUMOCOCCAL CONJUGATE-20 10/10/2021   Pneumococcal Conjugate-13 10/04/2016   Pneumococcal Polysaccharide-23 06/07/2005, 04/17/2018   Td 04/07/2018   Tdap 03/13/2014, 04/17/2018   Zoster Recombinat (Shingrix) 07/18/2021, 09/20/2021   Zoster, Live 09/24/2014    TDAP status: Up to date  Flu Vaccine status: Up to date  Pneumococcal vaccine status: Up to date  Covid-19 vaccine status: Completed vaccines  Qualifies for Shingles Vaccine? Yes   Zostavax completed No   Shingrix Completed?: Yes  Screening Tests Health Maintenance  Topic Date Due   FOOT EXAM  Never done   OPHTHALMOLOGY EXAM  Never done   INFLUENZA VACCINE  03/07/2022   HEMOGLOBIN A1C  06/16/2022   TETANUS/TDAP  04/17/2028   Pneumonia Vaccine 81 Years old  Completed   COVID-19 Vaccine  Completed   Zoster Vaccines- Shingrix  Completed   HPV VACCINES  Aged Out    Health Maintenance  Health Maintenance Due  Topic Date Due   FOOT EXAM  Never done   OPHTHALMOLOGY EXAM  Never done    Colorectal cancer screening: No longer required.   Lung Cancer Screening: (Low Dose CT Chest recommended if Age 82-80years, 30 pack-year currently smoking OR have quit w/in 15years.) does not qualify.   Additional Screening:  Vision Screening: Recommended annual ophthalmology exams for early detection of glaucoma and other disorders of the eye. Is the patient up to date with their annual eye exam?  Yes  Who is the provider or what is the name of the office in which the patient attends annual eye exams? [facility]  Screening: Recommended annual dental exams for proper oral hygiene     Plan:    1- Records requested for last diabetic eye exam 2- Patient states that the prednisone did not help his hands or the stabbing pain that intermittently shoots thru his head (from his surgical site)  I have personally reviewed and noted the following in the patient's chart:   Medical and social history Use of alcohol, tobacco or illicit drugs  Current medications and supplements including opioid prescriptions.  Functional ability and status Nutritional status Physical activity Advanced directives List of other physicians Hospitalizations, surgeries, and ER visits in previous 12 months Vitals Screenings to include cognitive, depression, and falls Referrals and appointments  In addition, I have reviewed and discussed with patient certain preventive protocols, quality metrics, and best practice recommendations. A written personalized care plan for preventive services as well as general preventive health recommendations were provided to patient.     KPAO HAFFEY LPN   [id number]

## 2022-01-27 ENCOUNTER — Other Ambulatory Visit: Payer: Self-pay | Admitting: Family Medicine

## 2022-03-08 ENCOUNTER — Other Ambulatory Visit: Payer: Self-pay | Admitting: Family Medicine

## 2022-03-20 ENCOUNTER — Ambulatory Visit (INDEPENDENT_AMBULATORY_CARE_PROVIDER_SITE_OTHER): Payer: Medicare HMO | Admitting: Family Medicine

## 2022-03-20 ENCOUNTER — Encounter: Payer: Self-pay | Admitting: Family Medicine

## 2022-03-20 VITALS — BP 110/64 | HR 76 | Temp 97.4°F | Resp 18 | Ht 71.0 in | Wt 256.0 lb

## 2022-03-20 DIAGNOSIS — E782 Mixed hyperlipidemia: Secondary | ICD-10-CM

## 2022-03-20 DIAGNOSIS — I10 Essential (primary) hypertension: Secondary | ICD-10-CM

## 2022-03-20 DIAGNOSIS — M79642 Pain in left hand: Secondary | ICD-10-CM | POA: Diagnosis not present

## 2022-03-20 DIAGNOSIS — J449 Chronic obstructive pulmonary disease, unspecified: Secondary | ICD-10-CM

## 2022-03-20 DIAGNOSIS — M2559 Pain in other specified joint: Secondary | ICD-10-CM | POA: Diagnosis not present

## 2022-03-20 DIAGNOSIS — M791 Myalgia, unspecified site: Secondary | ICD-10-CM

## 2022-03-20 DIAGNOSIS — T466X5A Adverse effect of antihyperlipidemic and antiarteriosclerotic drugs, initial encounter: Secondary | ICD-10-CM

## 2022-03-20 DIAGNOSIS — E669 Obesity, unspecified: Secondary | ICD-10-CM | POA: Diagnosis not present

## 2022-03-20 DIAGNOSIS — Z6835 Body mass index (BMI) 35.0-35.9, adult: Secondary | ICD-10-CM | POA: Diagnosis not present

## 2022-03-20 DIAGNOSIS — K219 Gastro-esophageal reflux disease without esophagitis: Secondary | ICD-10-CM

## 2022-03-20 DIAGNOSIS — E538 Deficiency of other specified B group vitamins: Secondary | ICD-10-CM

## 2022-03-20 DIAGNOSIS — M79641 Pain in right hand: Secondary | ICD-10-CM | POA: Diagnosis not present

## 2022-03-20 DIAGNOSIS — M542 Cervicalgia: Secondary | ICD-10-CM | POA: Diagnosis not present

## 2022-03-20 DIAGNOSIS — R7303 Prediabetes: Secondary | ICD-10-CM | POA: Diagnosis not present

## 2022-03-20 DIAGNOSIS — R768 Other specified abnormal immunological findings in serum: Secondary | ICD-10-CM | POA: Diagnosis not present

## 2022-03-20 DIAGNOSIS — E66812 Obesity, class 2: Secondary | ICD-10-CM

## 2022-03-20 DIAGNOSIS — I700 Atherosclerosis of aorta: Secondary | ICD-10-CM

## 2022-03-20 MED ORDER — ALBUTEROL SULFATE HFA 108 (90 BASE) MCG/ACT IN AERS: 2.0000 | INHALATION_SPRAY | Freq: Four times a day (QID) | RESPIRATORY_TRACT | 1 refills | Status: DC | PRN

## 2022-03-20 NOTE — Assessment & Plan Note (Signed)
Await labs/testing for assessment and recommendations. On praluent. Continue to work on eating a healthy diet and exercise.  Labs drawn today.

## 2022-03-20 NOTE — Assessment & Plan Note (Signed)
Well controlled.  No changes to medicines. Continue losartan 50 mg daily  Continue to work on eating a healthy diet and exercise.  Labs drawn today.  

## 2022-03-20 NOTE — Assessment & Plan Note (Signed)
intolerant to statins.

## 2022-03-20 NOTE — Assessment & Plan Note (Signed)
The current medical regimen is effective;  continue present plan and medications. Continue wixela and albuterol.

## 2022-03-20 NOTE — Assessment & Plan Note (Signed)
Continue repatha.  

## 2022-03-20 NOTE — Assessment & Plan Note (Signed)
Check A1c. 

## 2022-03-20 NOTE — Progress Notes (Signed)
Subjective:  Patient ID: Christian Lowe., male    DOB: 11/09/1940  Age: 81 y.o. MRN: [id number]  Assessment & Plan    Problem List Items Addressed This Visit       Cardiovascular and Mediastinum   Essential hypertension, benign - Primary    Well controlled.  No changes to medicines. Continue losartan 50 mg daily.  Continue to work on eating a healthy diet and exercise.  Labs drawn today.        Relevant Orders   CBC with Differential/Platelet   Comprehensive metabolic panel   Aortic atherosclerosis ([place])    Continue repatha.          Respiratory   COPD mixed type (HCC)    The current medical regimen is effective;  continue present plan and medications. Continue wixela and albuterol.       Relevant Medications   fluticasone-salmeterol (WIXELA INHUB) 250-50 MCG/ACT AEPB   albuterol (VENTOLIN HFA) 108 (90 Base) MCG/ACT inhaler     Digestive   GERD without esophagitis    Continue omeprazole 20 mg daily.         Other   Prediabetes    Check A1c      Relevant Orders   Hemoglobin A1c   Joint pain    Recheck labs.  Keep appt with rheumatology today.       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 Comorbidties: hyperlipidemia and hypertension.      Mixed hyperlipidemia    Await labs/testing for assessment and recommendations. On praluent. Continue to work on eating a healthy diet and exercise.  Labs drawn today.        Relevant Orders   Lipid panel   Myalgia due to statin    intolerant to statins.       RESOLVED: B12 deficiency     Chief Complaint  Patient presents with   Diabetes   Hyperlipidemia   HPI Hyperlipidemia: Current medications:  Intolerant to crestor or zetia. On Fish oil 100 mg one twice a day.  Praluent injection 75 mg/ml once every 2 weeks.   Hypertension: Current medications: Losartan '50mg'$  once daily.   COPD: Current medications: on wixela1 puff twice daily. Does not have albuterol.   Aortic Atherosclerosis: Current medications: Praluent injection '75mg'$ /ml once every two weeks.  GERD: on omeprazole 20 mg daily.   BPH: Trospium 20 mg once daily and Tamsulosin 0.4 mg before bed.   ERECTILE dysfunction: on cialis. Which helps.  Diet: healthy. Exercise: no   BL Hand pain and stiffness. Scheduled to see [facility]umatology today. Positive ccp ab test.   Current Outpatient Medications on File Prior to Visit  Medication Sig Dispense Refill   fluticasone-salmeterol (WIXELA INHUB) 250-50 MCG/ACT AEPB Inhale 1 puff into the lungs in the morning and at bedtime.     loratadine (CLARITIN) 10 MG tablet      losartan (COZAAR) 50 MG tablet Take 1 tablet (50 mg total) by mouth daily. 90 tablet 1   metFORMIN (GLUCOPHAGE) 500 MG tablet TAKE 1 TABLET (500 MG TOTAL) BY MOUTH DAILY. 90 tablet 0   naproxen (NAPROSYN) 500 MG tablet TAKE 1 TABLET (500 MG  TOTAL) BY MOUTH 2 (TWO) TIMES DAILY WITH A MEAL. AS NEEDED FOR PAIN. 60 tablet 0   Omega-3 Fatty Acids (FISH OIL) 1000 MG CPDR Take 1,000 mg by mouth in the morning and at bedtime.     omeprazole (PRILOSEC) 20 MG capsule Take 20 mg by mouth daily.     PRALUENT 75 MG/ML SOAJ INJECT 75 MLS INTO THE SKIN EVERY 14 (FOURTEEN) DAYS. 6 mL 0   predniSONE (DELTASONE) 10 MG tablet Take 1 tablet (10 mg total) by mouth daily with breakfast. 10 tablet 0   Probiotic Product (PROBIOTIC DAILY PO) Take 1 tablet by mouth 2 (two) times daily.     tadalafil (CIALIS) 10 MG tablet      tamsulosin (FLOMAX) 0.4 MG CAPS capsule 0.4 mg daily after supper.     trospium (SANCTURA) 20 MG tablet TAKE ONE-HALF TABLET BY MOUTH TWICE A DAY .  IF NO BOTHERSOME SIDE EFFECTS, INCREASE TO 1 TABLET TWICE DAILY  IF NEEDED FOR OVERACTIVE BLADDER.     Vitamin D, Ergocalciferol, (DRISDOL) 1.25 MG (50000 UNIT) CAPS capsule Take 50,000 Units by mouth once a week.     No current facility-administered medications on file prior to visit.   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   Epidermoid cyst of skin 10/31/2020   Fatigue 11/23/2020   Gastroesophageal reflux disease 10/31/2020   GERD (gastroesophageal reflux disease)    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   Hemorrhage of gastrointestinal tract 10/31/2020   History of basal cell carcinoma (BCC)    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   Joint pain 11/23/2020   Long-term current use of testosterone replacement therapy 11/23/2020   Low libido 11/23/2020   Mixed hyperlipidemia    Osteoarthritis    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   Polyp of colon 11/01/2020   Aug 31, 2015 Entered By: Alycia Rossetti B Comment: colonoscopy 05/2015 - needs repeat ASAP   Prediabetes    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   Sleep disturbance 11/23/2020   Status post craniotomy 02/10/2020   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   Urge incontinence of urine 11/23/2020   Vitamin D deficiency    Past Surgical History:  Procedure Laterality Date   CRANIOTOMY Left 02/10/2020   Procedure: CRANIOTOMY HEMATOMA EVACUATION SUBDURAL;  Surgeon: Earnie Larsson, MD;  Location: [facility];   Service: Neurosurgery;  Laterality: Left;   EYE SURGERY Bilateral 2012   cataracts w lens implants   PROSTATECTOMY  2008   BPH   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   TEMPORAL ARTERY BIOPSY / LIGATION Bilateral 1997   negative   TRANSURETHRAL RESECTION OF PROSTATE  2010    Family History  Problem Relation Age of Onset   Brain cancer Mother    CAD Father    Alcoholism Father    CAD Brother    Lung cancer Brother  CAD Brother    Social History   Socioeconomic History   Marital status: Widowed    Spouse name: Not on file   Number of children: Not on file   Years of education: Not on file   Highest education level: Not on file  Occupational History   Not on file  Tobacco Use   Smoking status: Former    Packs/day: 1.00    Years: 20.00    Total pack years: 20.00    Types: Cigarettes    Quit date: 01/06/1967    Years since quitting: 55.2   Smokeless tobacco: Never  Substance and Sexual Activity   Alcohol use: Yes    Comment: occassionally   Drug use: No   Sexual activity: Not on file  Other Topics Concern   Not on file  Social History Narrative   Not on file   Social Determinants of Health   Financial Resource Strain: Not on file  Food Insecurity: No Food Insecurity (12/29/2021)   Hunger Vital Sign    Worried About Running Out of Food in the Last Year: Never true    Ran Out of Food in the Last Year: Never true  Transportation Needs: No Transportation Needs (12/29/2021)   PRAPARE - Hydrologist (Medical): No    Lack of Transportation (Non-Medical): No  Physical Activity: Not on file  Stress: Not on file  Social Connections: Not on file    Review of Systems  Constitutional:  Positive for fatigue. Negative for chills and fever.  HENT:  Negative for congestion, rhinorrhea and sore throat.   Respiratory:  Negative for cough and shortness of breath.   Cardiovascular:  Negative for chest pain and palpitations.  Gastrointestinal:   Positive for constipation. Negative for abdominal pain, diarrhea, nausea and vomiting.  Genitourinary:  Negative for dysuria and urgency.       Bladder control issues. Trospium has helped immensely.   Musculoskeletal:  Positive for arthralgias (bilateral hand pain). Negative for back pain and myalgias.  Neurological:  Negative for dizziness and headaches.  Psychiatric/Behavioral:  Negative for dysphoric mood. The patient is not nervous/anxious.      Objective:  BP 110/64   Pulse 76   Temp (!) 97.4 F (36.3 C)   Resp 18   Ht '5\' 11"'$  (1.803 m)   Wt 256 lb (116.1 kg)   BMI 35.70 kg/m      03/20/2022    7:56 AM 12/29/2021    1:24 PM 12/14/2021    8:20 AM  BP/Weight  Systolic BP 315 400 867  Diastolic BP 64 78 80  Wt. (Lbs) 256 247 244  BMI 35.7 kg/m2 34.45 kg/m2 34.03 kg/m2    Physical Exam Vitals reviewed.  Constitutional:      Appearance: Normal appearance. He is obese.  Neck:     Vascular: No carotid bruit.  Cardiovascular:     Rate and Rhythm: Normal rate and regular rhythm.     Pulses: Normal pulses.     Heart sounds: Normal heart sounds.  Pulmonary:     Effort: Pulmonary effort is normal.     Breath sounds: Normal breath sounds. No wheezing, rhonchi or rales.  Abdominal:     General: Bowel sounds are normal.     Palpations: Abdomen is soft.     Tenderness: There is no abdominal tenderness.  Neurological:     Mental Status: He is alert.  Psychiatric:        Mood and Affect:  Mood normal.        Behavior: Behavior normal.     Diabetic Foot Exam - Simple   Simple Foot Form Diabetic Foot exam was performed with the following findings: Yes 03/20/2022  8:13 AM  Visual Inspection See comments: Yes Sensation Testing Intact to touch and monofilament testing bilaterally: Yes Pulse Check Posterior Tibialis and Dorsalis pulse intact bilaterally: Yes Comments Thickened yellowing nails.       Lab Results  Component Value Date   WBC 4.5 12/14/2021   HGB 16.3  12/14/2021   HCT 48.6 12/14/2021   PLT 174 12/14/2021   GLUCOSE 149 (H) 12/14/2021   CHOL 156 12/14/2021   TRIG 245 (H) 12/14/2021   HDL 44 12/14/2021   LDLCALC 72 12/14/2021   ALT 21 12/14/2021   AST 19 12/14/2021   NA 139 12/14/2021   K 4.3 12/14/2021   CL 103 12/14/2021   CREATININE 1.00 12/14/2021   BUN 17 12/14/2021   CO2 22 12/14/2021   TSH 3.800 09/09/2021   INR 1.1 02/10/2020   HGBA1C 6.2 (H) 12/14/2021      Assessment & Plan:   Myalgia due to statin intolerant to statins.   Mixed hyperlipidemia Await labs/testing for assessment and recommendations. On praluent. Continue to work on eating a healthy diet and exercise.  Labs drawn today.    COPD mixed type [facility]) The current medical regimen is effective;  continue present plan and medications. Continue wixela and albuterol.   Aortic atherosclerosis ([place]) Continue repatha.    Essential hypertension, benign Well controlled.  No changes to medicines. Continue losartan 50 mg daily.  Continue to work on eating a healthy diet and exercise.  Labs drawn today.    Joint pain Recheck labs.  Keep appt with rheumatology today.   Prediabetes Check A1c  GERD without esophagitis Continue omeprazole 20 mg daily.   Class 2 severe obesity due to excess calories with serious comorbidity and body mass index (BMI) of 35.0 to 35.9 in adult [facility]) Recommend continue to work on eating healthy diet and exercise. Comorbidties: hyperlipidemia and hypertension.    Meds ordered this encounter  Medications   albuterol (VENTOLIN HFA) 108 (90 Base) MCG/ACT inhaler    Sig: Inhale 2 puffs into the lungs every 6 (six) hours as needed for wheezing or shortness of breath.    Dispense:  24 g    Refill:  1    Orders Placed This Encounter  Procedures   CBC with Differential/Platelet   Comprehensive metabolic panel   Hemoglobin A1c   Lipid panel   Sedimentation rate   C-reactive protein   CYCLIC CITRUL PEPTIDE ANTIBODY,  IGG/IGA   Rheumatoid factor   ANA w/Reflex    Follow-up: Return in about 3 months (around 06/20/2022) for chronic fasting.  An After Visit Summary was printed and given to the patient.  Rochel Brome, MD [facility] [phone]

## 2022-03-20 NOTE — Assessment & Plan Note (Signed)
Recommend continue to work on eating healthy diet and exercise. Comorbidties: hyperlipidemia and hypertension.

## 2022-03-20 NOTE — Assessment & Plan Note (Signed)
>>  ASSESSMENT AND PLAN FOR PREDIABETES WRITTEN ON 03/20/2022  8:46 PM BY COX, KIRSTEN, MD  Check A1c

## 2022-03-20 NOTE — Assessment & Plan Note (Signed)
-  Continue omeprazole 20 mg daily

## 2022-03-20 NOTE — Assessment & Plan Note (Signed)
Recheck labs.  Keep appt with rheumatology today.

## 2022-03-21 ENCOUNTER — Telehealth: Payer: Self-pay

## 2022-03-21 LAB — COMPREHENSIVE METABOLIC PANEL WITH GFR
ALT: 12 [IU]/L (ref 0–44)
AST: 14 [IU]/L (ref 0–40)
Albumin/Globulin Ratio: 1.7 (ref 1.2–2.2)
Albumin: 4 g/dL (ref 3.7–4.7)
Alkaline Phosphatase: 44 [IU]/L (ref 44–121)
BUN/Creatinine Ratio: 16 (ref 10–24)
BUN: 16 mg/dL (ref 8–27)
Bilirubin Total: 0.8 mg/dL (ref 0.0–1.2)
CO2: 22 mmol/L (ref 20–29)
Calcium: 8.9 mg/dL (ref 8.6–10.2)
Chloride: 106 mmol/L (ref 96–106)
Creatinine, Ser: 1.01 mg/dL (ref 0.76–1.27)
Globulin, Total: 2.3 g/dL (ref 1.5–4.5)
Glucose: 130 mg/dL — ABNORMAL HIGH (ref 70–99)
Potassium: 4.4 mmol/L (ref 3.5–5.2)
Sodium: 141 mmol/L (ref 134–144)
Total Protein: 6.3 g/dL (ref 6.0–8.5)
eGFR: 74 mL/min/{1.73_m2}

## 2022-03-21 LAB — LIPID PANEL
Chol/HDL Ratio: 2.2 ratio (ref 0.0–5.0)
Cholesterol, Total: 80 mg/dL — ABNORMAL LOW (ref 100–199)
HDL: 37 mg/dL — ABNORMAL LOW (ref >39)
LDL Chol Calc (NIH): 19 mg/dL (ref 0–99)
Triglycerides: 141 mg/dL (ref 0–149)
VLDL Cholesterol Cal: 24 mg/dL (ref 5–40)

## 2022-03-21 LAB — CBC WITH DIFFERENTIAL/PLATELET
Basophils Absolute: 0.1 10*3/uL (ref 0.0–0.2)
Basos: 1 %
EOS (ABSOLUTE): 0.2 10*3/uL (ref 0.0–0.4)
Eos: 4 %
Hematocrit: 47.1 % (ref 37.5–51.0)
Hemoglobin: 15.6 g/dL (ref 13.0–17.7)
Immature Grans (Abs): 0 10*3/uL (ref 0.0–0.1)
Immature Granulocytes: 0 %
Lymphocytes Absolute: 1.2 10*3/uL (ref 0.7–3.1)
Lymphs: 22 %
MCH: 30.5 pg (ref 26.6–33.0)
MCHC: 33.1 g/dL (ref 31.5–35.7)
MCV: 92 fL (ref 79–97)
Monocytes Absolute: 0.6 10*3/uL (ref 0.1–0.9)
Monocytes: 12 %
Neutrophils Absolute: 3.3 10*3/uL (ref 1.4–7.0)
Neutrophils: 61 %
Platelets: 187 10*3/uL (ref 150–450)
RBC: 5.11 x10E6/uL (ref 4.14–5.80)
RDW: 12.8 % (ref 11.6–15.4)
WBC: 5.3 10*3/uL (ref 3.4–10.8)

## 2022-03-21 LAB — HEMOGLOBIN A1C
Est. average glucose Bld gHb Est-mCnc: 114 mg/dL
Hgb A1c MFr Bld: 5.6 % (ref 4.8–5.6)

## 2022-03-21 LAB — ANA W/REFLEX: Anti Nuclear Antibody (ANA): NEGATIVE

## 2022-03-21 LAB — CARDIOVASCULAR RISK ASSESSMENT

## 2022-03-21 LAB — C-REACTIVE PROTEIN: CRP: <1 mg/L (ref 0–10)

## 2022-03-21 LAB — CYCLIC CITRUL PEPTIDE ANTIBODY, IGG/IGA: Cyclic Citrullin Peptide Ab: 65 U — ABNORMAL HIGH (ref 0–19)

## 2022-03-21 LAB — RHEUMATOID FACTOR: Rheumatoid fact SerPl-aCnc: <10 [IU]/mL

## 2022-03-21 LAB — SEDIMENTATION RATE: Sed Rate: 7 mm/h (ref 0–30)

## 2022-03-21 NOTE — Progress Notes (Signed)
Care Gap(s) Not Met that Need to be Addressed:   Controlling High Blood Pressure   Action Taken: Updated care gap list with latest BP 03/20/22 110/64   Follow Up: 06/27/22 with Dr. Tobie Poet

## 2022-03-22 NOTE — Progress Notes (Signed)
Blood count normal.  Liver function normal.  Kidney function normal.  Cholesterol:great! The current medical regimen is effective;  continue present plan and medications. HBA1C: 6.2 improved. To 5.6. Inflammatory/Arthritis markers unchanged. Only one is elevated.  Please fax to [facility] rheumatology.  Dr. Tobie Poet

## 2022-04-03 DIAGNOSIS — M79642 Pain in left hand: Secondary | ICD-10-CM | POA: Diagnosis not present

## 2022-04-03 DIAGNOSIS — M79641 Pain in right hand: Secondary | ICD-10-CM | POA: Diagnosis not present

## 2022-04-03 DIAGNOSIS — R768 Other specified abnormal immunological findings in serum: Secondary | ICD-10-CM | POA: Diagnosis not present

## 2022-04-03 DIAGNOSIS — Z6836 Body mass index (BMI) 36.0-36.9, adult: Secondary | ICD-10-CM | POA: Diagnosis not present

## 2022-04-03 DIAGNOSIS — M542 Cervicalgia: Secondary | ICD-10-CM | POA: Diagnosis not present

## 2022-04-03 DIAGNOSIS — E669 Obesity, unspecified: Secondary | ICD-10-CM | POA: Diagnosis not present

## 2022-04-17 DIAGNOSIS — E669 Obesity, unspecified: Secondary | ICD-10-CM | POA: Diagnosis not present

## 2022-04-17 DIAGNOSIS — M059 Rheumatoid arthritis with rheumatoid factor, unspecified: Secondary | ICD-10-CM | POA: Diagnosis not present

## 2022-04-17 DIAGNOSIS — M79642 Pain in left hand: Secondary | ICD-10-CM | POA: Diagnosis not present

## 2022-04-17 DIAGNOSIS — M79641 Pain in right hand: Secondary | ICD-10-CM | POA: Diagnosis not present

## 2022-04-17 DIAGNOSIS — R768 Other specified abnormal immunological findings in serum: Secondary | ICD-10-CM | POA: Diagnosis not present

## 2022-04-17 DIAGNOSIS — Z6836 Body mass index (BMI) 36.0-36.9, adult: Secondary | ICD-10-CM | POA: Diagnosis not present

## 2022-04-17 DIAGNOSIS — M542 Cervicalgia: Secondary | ICD-10-CM | POA: Diagnosis not present

## 2022-05-04 DIAGNOSIS — L821 Other seborrheic keratosis: Secondary | ICD-10-CM | POA: Diagnosis not present

## 2022-05-04 DIAGNOSIS — D045 Carcinoma in situ of skin of trunk: Secondary | ICD-10-CM | POA: Diagnosis not present

## 2022-05-04 DIAGNOSIS — L578 Other skin changes due to chronic exposure to nonionizing radiation: Secondary | ICD-10-CM | POA: Diagnosis not present

## 2022-05-04 DIAGNOSIS — L820 Inflamed seborrheic keratosis: Secondary | ICD-10-CM | POA: Diagnosis not present

## 2022-05-04 DIAGNOSIS — L570 Actinic keratosis: Secondary | ICD-10-CM | POA: Diagnosis not present

## 2022-05-07 ENCOUNTER — Other Ambulatory Visit: Payer: Self-pay | Admitting: Family Medicine

## 2022-06-02 ENCOUNTER — Ambulatory Visit: Payer: Medicare HMO | Attending: Cardiology | Admitting: Cardiology

## 2022-06-02 ENCOUNTER — Encounter: Payer: Self-pay | Admitting: Cardiology

## 2022-06-02 ENCOUNTER — Ambulatory Visit (INDEPENDENT_AMBULATORY_CARE_PROVIDER_SITE_OTHER): Payer: Medicare HMO

## 2022-06-02 ENCOUNTER — Telehealth: Payer: Self-pay

## 2022-06-02 ENCOUNTER — Telehealth: Payer: Self-pay | Admitting: Cardiology

## 2022-06-02 VITALS — BP 166/70 | HR 73 | Ht 71.0 in | Wt 265.2 lb

## 2022-06-02 DIAGNOSIS — J449 Chronic obstructive pulmonary disease, unspecified: Secondary | ICD-10-CM

## 2022-06-02 DIAGNOSIS — I493 Ventricular premature depolarization: Secondary | ICD-10-CM | POA: Diagnosis not present

## 2022-06-02 DIAGNOSIS — R0600 Dyspnea, unspecified: Secondary | ICD-10-CM | POA: Diagnosis not present

## 2022-06-02 DIAGNOSIS — R768 Other specified abnormal immunological findings in serum: Secondary | ICD-10-CM

## 2022-06-02 DIAGNOSIS — R079 Chest pain, unspecified: Secondary | ICD-10-CM

## 2022-06-02 DIAGNOSIS — Z6833 Body mass index (BMI) 33.0-33.9, adult: Secondary | ICD-10-CM | POA: Diagnosis not present

## 2022-06-02 DIAGNOSIS — Z23 Encounter for immunization: Secondary | ICD-10-CM

## 2022-06-02 DIAGNOSIS — R7681 Abnormal rheumatoid factor and anti-citrullinated protein antibody without rheumatoid arthritis: Secondary | ICD-10-CM | POA: Insufficient documentation

## 2022-06-02 DIAGNOSIS — I10 Essential (primary) hypertension: Secondary | ICD-10-CM

## 2022-06-02 DIAGNOSIS — M25649 Stiffness of unspecified hand, not elsewhere classified: Secondary | ICD-10-CM | POA: Insufficient documentation

## 2022-06-02 DIAGNOSIS — R0609 Other forms of dyspnea: Secondary | ICD-10-CM | POA: Diagnosis not present

## 2022-06-02 DIAGNOSIS — I499 Cardiac arrhythmia, unspecified: Secondary | ICD-10-CM | POA: Diagnosis not present

## 2022-06-02 DIAGNOSIS — M059 Rheumatoid arthritis with rheumatoid factor, unspecified: Secondary | ICD-10-CM | POA: Insufficient documentation

## 2022-06-02 HISTORY — DX: Encounter for immunization: Z23

## 2022-06-02 HISTORY — DX: Other specified abnormal immunological findings in serum: R76.8

## 2022-06-02 HISTORY — DX: Stiffness of unspecified hand, not elsewhere classified: M25.649

## 2022-06-02 HISTORY — DX: Ventricular premature depolarization: I49.3

## 2022-06-02 NOTE — Patient Instructions (Addendum)
Medication Instructions:  Your physician recommends that you continue on your current medications as directed. Please refer to the Current Medication list given to you today.  *If you need a refill on your cardiac medications before your next appointment, please call your pharmacy*   Lab Work: ProBNP, Troponin, Magnesium, BMP- today If you have labs (blood work) drawn today and your tests are completely normal, you will receive your results only by: [place] (if you have MyChart) OR A paper copy in the mail If you have any lab test that is abnormal or we need to change your treatment, we will call you to review the results.   Testing/Procedures: Your physician has requested that you have an echocardiogram. Echocardiography is a painless test that uses sound waves to create images of your heart. It provides your doctor with information about the size and shape of your heart and how well your heart's chambers and valves are working. This procedure takes approximately one hour. There are no restrictions for this procedure. Please do NOT wear cologne, perfume, aftershave, or lotions (deodorant is allowed). Please arrive 15 minutes prior to your appointment time.    WHY IS MY DOCTOR PRESCRIBING ZIO? The Zio system is proven and trusted by physicians to detect and diagnose irregular heart rhythms -- and has been prescribed to hundreds of thousands of patients.  The FDA has cleared the Zio system to monitor for many different kinds of irregular heart rhythms. In a study, physicians were able to reach a diagnosis 90% of the time with the Zio system1.  You can wear the Zio monitor -- a small, discreet, comfortable patch -- during your normal day-to-day activity, including while you sleep, shower, and exercise, while it records every single heartbeat for analysis.  1Barrett, P., et al. Comparison of 24 Hour Holter Monitoring Versus 14 Day Novel Adhesive Patch Electrocardiographic Monitoring.  [place], 2014.  ZIO VS. HOLTER MONITORING The Zio monitor can be comfortably worn for up to 14 days. Holter monitors can be worn for 24 to 48 hours, limiting the time to record any irregular heart rhythms you may have. Zio is able to capture data for the 51% of patients who have their first symptom-triggered arrhythmia after 48 hours.1  LIVE WITHOUT RESTRICTIONS The Zio ambulatory cardiac monitor is a small, unobtrusive, and water-resistant patch--you might even forget you're wearing it. The Zio monitor records and stores every beat of your heart, whether you're sleeping, working out, or showering.    Follow-Up: At [facility], you and your health needs are our priority.  As part of our continuing mission to provide you with exceptional heart care, we have created designated Provider Care Teams.  These Care Teams include your primary Cardiologist (physician) and Advanced Practice Providers (APPs -  Physician Assistants and Nurse Practitioners) who all work together to provide you with the care you need, when you need it.  We recommend signing up for the patient portal called "MyChart".  Sign up information is provided on this After Visit Summary.  MyChart is used to connect with patients for Virtual Visits (Telemedicine).  Patients are able to view lab/test results, encounter notes, upcoming appointments, etc.  Non-urgent messages can be sent to your provider as well.   To learn more about what you can do with MyChart, go to [email or web address].    Your next appointment:   3 month(s)  The format for your next appointment:   In Person  Provider:   Jenne Campus, MD  Other Instructions NA  

## 2022-06-02 NOTE — Telephone Encounter (Signed)
Patient states someone at [facility] advised he could be worked in for an appointment today, but it would not be with Dr. Bettina Gavia. Has anyone spoken with [facility] regarding this matter? Please advise.

## 2022-06-02 NOTE — Progress Notes (Signed)
Cardiology Office Note:    Date:  06/02/2022   ID:  Christian Sensing Augusten Lipkin., DOB [birth year], MRN [id number]  PCP:  Christian Brome, MD  Cardiologist:  Christian Campus, MD    Referring MD: Christian Brome, MD   Chief Complaint  Patient presents with   Palpitations    Ongoing 4 weeks    History of Present Illness:    Christian Lowe. is a 81 y.o. male with past medical history significant for essential hypertension, dyslipidemia with intolerance to multiple medications now he is on PCSK9 agent that he is LDL is very well controlled, calcification of the coronary artery noted on the CT of the abdomen couple years ago.  He was referred to [place] after seeing urgent center.  He went there because this morning he noticed his heart skipping.  He went there he had EKG done showed frequent ventricular ectopy look like origin of those PVCs is from left ventricle.  He does not have any dizziness there is no chest pain tightness squeezing pressure burning chest but he described to have gradual increase in shortness of breath over period of years.  Denies having any anginal symptoms there is some swelling of lower extremities but he cannot tell me for how long he has been experiencing this.  He said he does not snore at night.  He is completely asymptomatic today he described also the fact that he donate blood quite often but he was disqualified last time because somebody felt some extrasystole.  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   Epidermoid cyst of skin 10/31/2020   Fatigue 11/23/2020   Gastroesophageal reflux disease 10/31/2020   GERD (gastroesophageal reflux disease)    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   Hemorrhage of gastrointestinal tract 10/31/2020   History of basal cell carcinoma (BCC)    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   Joint pain 11/23/2020   Long-term current use of testosterone replacement therapy 11/23/2020   Low libido 11/23/2020   Mixed hyperlipidemia    Osteoarthritis    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   Polyp of colon 11/01/2020   Aug 31, 2015 Entered By: Christian Lowe Comment: colonoscopy 05/2015 - needs repeat ASAP   Prediabetes    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/26/2020   Sleep disturbance 11/23/2020   Status post craniotomy 02/10/2020   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   Urge incontinence of urine 11/23/2020   Vitamin D deficiency     Past Surgical History:  Procedure Laterality Date   CRANIOTOMY Left 02/10/2020   Procedure: CRANIOTOMY HEMATOMA EVACUATION SUBDURAL;  Surgeon: Christian Larsson, MD;  Location: [facility];  Service: Neurosurgery;  Laterality: Left;   EYE SURGERY Bilateral 2012   cataracts w lens implants   PROSTATECTOMY  2008   BPH   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   TEMPORAL ARTERY BIOPSY /  LIGATION Bilateral 1997   negative   TRANSURETHRAL RESECTION OF PROSTATE  2010    Current Medications: Current Meds  Medication Sig   Cetirizine-Pseudoephedrine (ALLERGY RELIEF D PO) Take 1 tablet by mouth as needed (allergies).   ezetimibe (ZETIA) 10 MG tablet Take 10 mg by mouth daily.   losartan (COZAAR) 50 MG tablet Take 1 tablet (50 mg total) by mouth daily.   metFORMIN (GLUCOPHAGE) 500 MG tablet TAKE 1 TABLET (500 MG TOTAL) BY MOUTH DAILY.   omeprazole (PRILOSEC) 20 MG capsule Take 20 mg by mouth daily.   rosuvastatin (CRESTOR) 40  MG tablet Take 20 mg by mouth daily.   tadalafil (CIALIS) 10 MG tablet Take 10 mg by mouth daily as needed for erectile dysfunction.   trospium (SANCTURA) 20 MG tablet Take 10 mg by mouth 2 (two) times daily.   [DISCONTINUED] albuterol (VENTOLIN HFA) 108 (90 Base) MCG/ACT inhaler Inhale 2 puffs into the lungs every 6 (six) hours as needed for wheezing or shortness of breath.   [DISCONTINUED] fluticasone-salmeterol (WIXELA INHUB) 250-50 MCG/ACT AEPB Inhale 1 puff into the lungs in the morning and at bedtime.   [DISCONTINUED] loratadine (CLARITIN) 10 MG tablet Take 10 mg by mouth daily.   [DISCONTINUED] naproxen (NAPROSYN) 500 MG tablet TAKE 1 TABLET (500 MG TOTAL) BY MOUTH 2 (TWO) TIMES DAILY WITH A MEAL. AS NEEDED FOR PAIN.   [DISCONTINUED] Omega-3 Fatty Acids (FISH OIL) 1000 MG CPDR Take 1,000 mg by mouth in the morning and at bedtime.   [DISCONTINUED] PRALUENT 75 MG/ML SOAJ INJECT 75 MLS INTO THE SKIN EVERY 14 (FOURTEEN) DAYS.   [DISCONTINUED] predniSONE (DELTASONE) 10 MG tablet Take 1 tablet (10 mg total) by mouth daily with breakfast.   [DISCONTINUED] Probiotic Product (PROBIOTIC DAILY PO) Take 1 tablet by mouth 2 (two) times daily.   [DISCONTINUED] tamsulosin (FLOMAX) 0.4 MG CAPS capsule Take 0.4 mg by mouth daily after supper.   [DISCONTINUED] Vitamin D, Ergocalciferol, (DRISDOL) 1.25 MG (50000 UNIT) CAPS capsule Take 50,000 Units by mouth once a week.     Allergies:   Atorvastatin, Codeine, Niacin, No known allergies, Piroxicam, Pravastatin, Statins, and Zocor [simvastatin]   Social History   Socioeconomic History   Marital status: Widowed    Spouse name: Not on file   Number of children: Not on file   Years of education: Not on file   Highest education level: Not on file  Occupational History   Not on file  Tobacco Use   Smoking status: Former    Packs/day: 1.00    Years: 20.00    Total pack years: 20.00    Types: Cigarettes    Quit date: 01/06/1967    Years since quitting:  55.4   Smokeless tobacco: Never  Substance and Sexual Activity   Alcohol use: Yes    Comment: occassionally   Drug use: No   Sexual activity: Not on file  Other Topics Concern   Not on file  Social History Narrative   Not on file   Social Determinants of Health   Financial Resource Strain: Not on file  Food Insecurity: No Food Insecurity (12/29/2021)   Hunger Vital Sign    Worried About Running Out of Food in the Last Year: Never true    Ran Out of Food in the Last Year: Never true  Transportation Needs: No Transportation Needs (12/29/2021)   PRAPARE - Hydrologist (Medical): No    Lack of Transportation (Non-Medical): No  Physical Activity:  Not on file  Stress: Not on file  Social Connections: Not on file     Family History: The patient's family history includes Alcoholism in his father; Brain cancer in his mother; CAD in his brother, brother, and father; Lung cancer in his brother. ROS:   Please see the history of present illness.    All 14 point review of systems negative except as described per history of present illness  EKGs/Labs/Other Studies Reviewed:      Recent Labs: 09/09/2021: TSH 3.800 03/20/2022: ALT 12; BUN 16; Creatinine, Ser 1.01; Hemoglobin 15.6; Platelets 187; Potassium 4.4; Sodium 141  Recent Lipid Panel    Component Value Date/Time   CHOL 80 (L) 03/20/2022 0919   TRIG 141 03/20/2022 0919   HDL 37 (L) 03/20/2022 0919   CHOLHDL 2.2 03/20/2022 0919   LDLCALC 19 03/20/2022 0919    Physical Exam:    VS:  BP (!) 166/70 (BP Location: Left Arm, Patient Position: Sitting)   Pulse 73   Ht '5\' 11"'$  (1.803 m)   Wt 265 lb 3.2 oz (120.3 kg)   SpO2 93%   BMI 36.99 kg/m     Wt Readings from Last 3 Encounters:  06/02/22 265 lb 3.2 oz (120.3 kg)  03/20/22 256 lb (116.1 kg)  12/29/21 247 lb (112 kg)     GEN:  Well nourished, well developed in no acute distress HEENT: Normal NECK: No JVD; No carotid bruits LYMPHATICS: No  lymphadenopathy CARDIAC: RRR, no murmurs, no rubs, no gallops RESPIRATORY:  Clear to auscultation without rales, wheezing or rhonchi  ABDOMEN: Soft, non-tender, non-distended MUSCULOSKELETAL:  No edema; No deformity  SKIN: Warm and dry LOWER EXTREMITIES: no swelling NEUROLOGIC:  Alert and oriented x 3 PSYCHIATRIC:  Normal affect   ASSESSMENT:    1. Essential hypertension, benign   2. Ventricular ectopy   3. COPD mixed type ([place])   4. Body mass index (BMI) 33.0-33.9, adult    PLAN:    In order of problems listed above:  Ventricle ectopy noted on the EKG.  I did review EKG in the chart and this is similar QRS complex morphology on EKG from 2011 with PVCs.  I will ask him to have echocardiogram to assess left ventricle ejection fraction we will check potassium magnesium as well as troponin I today. Coronary calcification noted on the CT of the abdomen.  He does not have any typical symptoms indicating coronary artery disease but he does have shortness of breath which could be anginal equivalent.  I will ask him to have echocardiogram done if echocardiogram which showed preserved left ventricle ejection fraction then we most likely do stress test if echocardiogram showed diminished ejection fraction then we will may be even forced to reach for cardiac catheterization. COPD stable. Dyslipidemia he is on PCSK9 agent and he is LDL is 19 HDL 37 this is from summer of this year.   Medication Adjustments/Labs and Tests Ordered: Current medicines are reviewed at length with the patient today.  Concerns regarding medicines are outlined above.  No orders of the defined types were placed in this encounter.  Medication changes: No orders of the defined types were placed in this encounter.   Signed, Park Liter, MD, [facility] 06/02/2022 1:32 PM    [place]

## 2022-06-02 NOTE — Addendum Note (Signed)
Addended by: Jacobo Forest D on: 06/02/2022 01:55 PM   Modules accepted: Orders

## 2022-06-02 NOTE — Telephone Encounter (Signed)
Pt was seen at 1:00pm today by Dr. Agustin Cree

## 2022-06-02 NOTE — Telephone Encounter (Signed)
Christian Lowe called to report that he went to give platelets on Wednesday but they turned him away because of an irregular heart rate.  He has had no shortness of breath or chest pain but he has felt more fatigued.  We have no available appointments this morning.  He was instructed to go to the Urgent Care for evaluation this morning, he agreed to do so.

## 2022-06-03 LAB — BASIC METABOLIC PANEL
BUN/Creatinine Ratio: 16 (ref 10–24)
BUN: 15 mg/dL (ref 8–27)
CO2: 24 mmol/L (ref 20–29)
Calcium: 9.4 mg/dL (ref 8.6–10.2)
Chloride: 104 mmol/L (ref 96–106)
Creatinine, Ser: 0.95 mg/dL (ref 0.76–1.27)
Glucose: 88 mg/dL (ref 70–99)
Potassium: 4.7 mmol/L (ref 3.5–5.2)
Sodium: 142 mmol/L (ref 134–144)
eGFR: 80 mL/min/{1.73_m2} (ref >59)

## 2022-06-03 LAB — MAGNESIUM: Magnesium: 2.1 mg/dL (ref 1.6–2.3)

## 2022-06-03 LAB — TROPONIN T: Troponin T (Highly Sensitive): 16 ng/L (ref 0–22)

## 2022-06-03 LAB — PRO B NATRIURETIC PEPTIDE: NT-Pro BNP: 37 pg/mL (ref 0–486)

## 2022-06-07 ENCOUNTER — Telehealth: Payer: Self-pay | Admitting: *Deleted

## 2022-06-07 NOTE — Telephone Encounter (Signed)
Received email from [employer] that device fell off within the first 24 hours and they have sent out a new device for pt to apply.

## 2022-06-08 ENCOUNTER — Other Ambulatory Visit: Payer: Self-pay | Admitting: Family Medicine

## 2022-06-12 ENCOUNTER — Telehealth: Payer: Self-pay | Admitting: Cardiology

## 2022-06-12 DIAGNOSIS — E785 Hyperlipidemia, unspecified: Secondary | ICD-10-CM | POA: Diagnosis not present

## 2022-06-12 DIAGNOSIS — K219 Gastro-esophageal reflux disease without esophagitis: Secondary | ICD-10-CM | POA: Diagnosis not present

## 2022-06-12 DIAGNOSIS — N3941 Urge incontinence: Secondary | ICD-10-CM | POA: Diagnosis not present

## 2022-06-12 DIAGNOSIS — E559 Vitamin D deficiency, unspecified: Secondary | ICD-10-CM | POA: Diagnosis not present

## 2022-06-12 DIAGNOSIS — N182 Chronic kidney disease, stage 2 (mild): Secondary | ICD-10-CM | POA: Diagnosis not present

## 2022-06-12 DIAGNOSIS — J4489 Other specified chronic obstructive pulmonary disease: Secondary | ICD-10-CM | POA: Diagnosis not present

## 2022-06-12 DIAGNOSIS — I700 Atherosclerosis of aorta: Secondary | ICD-10-CM | POA: Diagnosis not present

## 2022-06-12 DIAGNOSIS — E1122 Type 2 diabetes mellitus with diabetic chronic kidney disease: Secondary | ICD-10-CM | POA: Diagnosis not present

## 2022-06-12 DIAGNOSIS — M069 Rheumatoid arthritis, unspecified: Secondary | ICD-10-CM | POA: Diagnosis not present

## 2022-06-12 DIAGNOSIS — M1990 Unspecified osteoarthritis, unspecified site: Secondary | ICD-10-CM | POA: Diagnosis not present

## 2022-06-12 DIAGNOSIS — I129 Hypertensive chronic kidney disease with stage 1 through stage 4 chronic kidney disease, or unspecified chronic kidney disease: Secondary | ICD-10-CM | POA: Diagnosis not present

## 2022-06-12 NOTE — Telephone Encounter (Signed)
Park Liter, MD 06/07/2022  4:59 PM EDT     Labs are looking pretty good except slightly elevated glucose

## 2022-06-12 NOTE — Telephone Encounter (Signed)
Pt aware of lab results ./cy 

## 2022-06-12 NOTE — Telephone Encounter (Signed)
Patient returned call for lab results.  

## 2022-06-16 ENCOUNTER — Ambulatory Visit: Payer: Medicare HMO | Attending: Cardiology

## 2022-06-16 DIAGNOSIS — R0609 Other forms of dyspnea: Secondary | ICD-10-CM | POA: Diagnosis not present

## 2022-06-16 DIAGNOSIS — I10 Essential (primary) hypertension: Secondary | ICD-10-CM | POA: Diagnosis not present

## 2022-06-16 DIAGNOSIS — I493 Ventricular premature depolarization: Secondary | ICD-10-CM | POA: Diagnosis not present

## 2022-06-16 DIAGNOSIS — R079 Chest pain, unspecified: Secondary | ICD-10-CM

## 2022-06-16 LAB — ECHOCARDIOGRAM COMPLETE
AR max vel: 1.38 cm2
AV Area VTI: 1.52 cm2
AV Area mean vel: 1.49 cm2
AV Mean grad: 10.5 mmHg
AV Peak grad: 17.1 mmHg
Ao pk vel: 2.07 m/s
Area-P 1/2: 2.05 cm2
S' Lateral: 2.4 cm

## 2022-06-20 ENCOUNTER — Telehealth: Payer: Self-pay

## 2022-06-20 NOTE — Telephone Encounter (Signed)
Results reviewed with pt as per Dr. Krasowski's note.  Pt verbalized understanding and had no additional questions. Routed to PCP  

## 2022-06-24 DIAGNOSIS — I493 Ventricular premature depolarization: Secondary | ICD-10-CM | POA: Diagnosis not present

## 2022-06-24 DIAGNOSIS — R079 Chest pain, unspecified: Secondary | ICD-10-CM | POA: Diagnosis not present

## 2022-06-27 ENCOUNTER — Ambulatory Visit: Payer: Medicare HMO | Admitting: Family Medicine

## 2022-07-05 NOTE — Progress Notes (Unsigned)
Subjective:  Patient ID: Christian Deem., male    DOB: [birth year]  Age: 81 y.o. MRN: [id number]  Chief Complaint  Patient presents with   Prediabetes   Hyperlipidemia   Hypertension    HPI Hyperlipidemia: Current medications: Tolerating crestor 40 mg 1/2 daily or zetia 10 mg once daily. On Fish oil 100 mg one twice a day.  Praluent injection 75 mg/ml once every 2 weeks.   Hypertension: Current medications: Losartan '50mg'$  once daily.   COPD: Current medications: on wixela 1 puff twice daily. Does not have albuterol.   Aortic Atherosclerosis: Current medications: Praluent injection 75 mg/ml once every two weeks.  GERD: on omeprazole 20 mg daily.   BPH: Trospium 20 mg once daily and Tamsulosin 0.4 mg before bed.   Patient went to urgent care in October for palpitations. No chest pain. Some dyspnea. Echo reviewed. Holtor monitor: abnormal. Patient to see cardiology in mid December.   Current Outpatient Medications on File Prior to Visit  Medication Sig Dispense Refill   Alirocumab (PRALUENT) 75 MG/ML SOAJ INJECT 75 MLS INTO THE SKIN EVERY 14 (FOURTEEN) DAYS. 6 mL 0   Cetirizine-Pseudoephedrine (ALLERGY RELIEF D PO) Take 1 tablet by mouth as needed (allergies).     ezetimibe (ZETIA) 10 MG tablet Take 10 mg by mouth daily.     gemfibrozil (LOPID) 600 MG tablet Take 600 mg by mouth 2 (two) times daily before a meal.     losartan (COZAAR) 50 MG tablet Take 1 tablet (50 mg total) by mouth daily. 90 tablet 1   omeprazole (PRILOSEC) 20 MG capsule Take 20 mg by mouth daily.     rosuvastatin (CRESTOR) 40 MG tablet Take 20 mg by mouth daily.     tadalafil (CIALIS) 10 MG tablet Take 10 mg by mouth daily as needed for erectile dysfunction.     trospium (SANCTURA) 20 MG tablet Take 10 mg by mouth 2 (two) times daily.     No current facility-administered medications on file prior to visit.   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   Epidermoid cyst of skin 10/31/2020   Fatigue 11/23/2020   Gastroesophageal reflux disease 10/31/2020   GERD (gastroesophageal reflux disease)    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   Hemorrhage of gastrointestinal tract 10/31/2020   History of basal cell carcinoma (BCC)    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   Joint pain 11/23/2020   Long-term current use of testosterone replacement therapy 11/23/2020   Low libido 11/23/2020   Mixed hyperlipidemia    Osteoarthritis    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   Polyp of colon 11/01/2020   Aug 31, 2015 Entered By: Alycia Rossetti B Comment: colonoscopy 05/2015 - needs repeat ASAP   Prediabetes    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   Sleep disturbance 11/23/2020   Status post craniotomy 02/10/2020   Superficial basal cell carcinoma  11/23/2020   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   Urge incontinence of urine 11/23/2020   Vitamin D deficiency    Past Surgical History:  Procedure Laterality Date   CRANIOTOMY Left 02/10/2020   Procedure: CRANIOTOMY HEMATOMA EVACUATION SUBDURAL;  Surgeon: Earnie Larsson, MD;  Location: [facility];  Service: Neurosurgery;  Laterality: Left;   EYE SURGERY Bilateral 2012   cataracts w lens implants   PROSTATECTOMY  2008   BPH   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   TEMPORAL ARTERY BIOPSY / LIGATION Bilateral 1997   negative   TRANSURETHRAL RESECTION OF PROSTATE  2010    Family History  Problem Relation Age  of Onset   Brain cancer Mother    CAD Father    Alcoholism Father    CAD Brother    Lung cancer Brother    CAD Brother    Social History   Socioeconomic History   Marital status: Widowed    Spouse name: Not on file   Number of children: Not on file   Years of education: Not on file   Highest education level: Not on file  Occupational History   Not on file  Tobacco Use   Smoking status: Former    Packs/day: 1.00    Years: 20.00    Total pack years: 20.00    Types: Cigarettes    Quit date: 01/06/1967    Years since quitting: 55.5   Smokeless tobacco: Never  Substance and Sexual Activity   Alcohol use: Yes    Comment: occassionally   Drug use: No   Sexual activity: Not on file  Other Topics Concern   Not on file  Social History Narrative   Not on file   Social Determinants of Health   Financial Resource Strain: Not on file  Food Insecurity: No Food Insecurity (12/29/2021)   Hunger Vital Sign    Worried About Running Out of Food in the Last Year: Never true    Ran Out of Food in the Last Year: Never true  Transportation Needs: No Transportation Needs (12/29/2021)   PRAPARE - Hydrologist (Medical): No    Lack of Transportation (Non-Medical): No  Physical Activity: Not on file  Stress: Not on file  Social Connections: Not on file    Review of Systems  Constitutional:  Negative for chills, fatigue, fever and unexpected weight change.  HENT:  Positive for postnasal drip. Negative for congestion, ear pain, sinus pain and sore throat.   Respiratory:  Positive for cough and shortness of breath.   Cardiovascular:  Negative for chest pain and palpitations.  Gastrointestinal:  Negative for abdominal pain, constipation, diarrhea, nausea and vomiting.  Endocrine: Negative for polydipsia, polyphagia and polyuria.  Genitourinary:  Negative for dysuria and frequency.  Musculoskeletal:  Negative for arthralgias and back pain.  Skin:  Negative for  rash.  Neurological:  Negative for dizziness and headaches.  Psychiatric/Behavioral:  Negative for dysphoric mood. The patient is not nervous/anxious.      Objective:  BP (!) 140/70   Pulse 95   Temp (!) 97.3 F (36.3 C)   Resp 16   Ht '5\' 11"'$  (1.803 m)   Wt 255 lb (115.7 kg)   SpO2 97%   BMI 35.57 kg/m      07/06/2022    7:53 AM 06/02/2022    1:12 PM 03/20/2022    7:56 AM  BP/Weight  Systolic BP 295 188 416  Diastolic BP 70 70 64  Wt. (Lbs)  255 265.2 256  BMI 35.57 kg/m2 36.99 kg/m2 35.7 kg/m2    Physical Exam Vitals reviewed.  Constitutional:      Appearance: Normal appearance.  HENT:     Right Ear: Tympanic membrane normal.     Left Ear: Tympanic membrane normal.     Nose: Rhinorrhea present.     Mouth/Throat:     Pharynx: No oropharyngeal exudate or posterior oropharyngeal erythema.  Neck:     Vascular: No carotid bruit.  Cardiovascular:     Rate and Rhythm: Normal rate and regular rhythm.     Heart sounds: Normal heart sounds.  Pulmonary:     Effort: Pulmonary effort is normal.     Breath sounds: Normal breath sounds. No wheezing, rhonchi or rales.  Abdominal:     General: Bowel sounds are normal.     Palpations: Abdomen is soft.     Tenderness: There is no abdominal tenderness.  Neurological:     Mental Status: He is alert and oriented to person, place, and time.  Psychiatric:        Mood and Affect: Mood normal.        Behavior: Behavior normal.     Diabetic Foot Exam - Simple   No data filed      Lab Results  Component Value Date   WBC 4.2 07/06/2022   HGB 15.8 07/06/2022   HCT 48.7 07/06/2022   PLT 193 07/06/2022   GLUCOSE 151 (H) 07/06/2022   CHOL 67 (L) 07/06/2022   TRIG 111 07/06/2022   HDL 39 (L) 07/06/2022   LDLCALC 7 07/06/2022   ALT 14 07/06/2022   AST 18 07/06/2022   NA 142 07/06/2022   K 4.6 07/06/2022   CL 105 07/06/2022   CREATININE 1.04 07/06/2022   BUN 14 07/06/2022   CO2 20 07/06/2022   TSH 3.800 09/09/2021    INR 1.1 02/10/2020   HGBA1C 6.7 (H) 07/06/2022      Assessment & Plan:   Problem List Items Addressed This Visit       Cardiovascular and Mediastinum   Essential hypertension, benign - Primary    Well controlled.  No changes to medicines. Losartan '50mg'$  once daily.  Continue to work on eating a healthy diet and exercise.  Labs drawn today.        Relevant Medications   gemfibrozil (LOPID) 600 MG tablet   Other Relevant Orders   Comprehensive metabolic panel (Completed)   CBC with Differential/Platelet (Completed)   Cardiovascular Risk Assessment (Completed)     Respiratory   COPD mixed type ([place])    The current medical regimen is effective;  continue present plan and medications.  wixela1 puff twice daily      Relevant Medications   fluticasone-salmeterol (WIXELA INHUB) 250-50 MCG/ACT AEPB   cetirizine (ZYRTEC) 10 MG tablet     Digestive   GERD without esophagitis    The current medical regimen is effective;  continue present plan and medications. omeprazole 20 mg daily.         Genitourinary   Benign prostatic hyperplasia    Continue Trospium 20 mg once daily and Tamsulosin 0.4 mg before bed.          Other   Vitamin D deficiency    Check labs.      Prediabetes    Hemoglobin A1c 5.6%, 3 month avg of blood sugars, is in prediabetic range.  In order to prevent progression to diabetes, recommend low carb diet and regular exercise  Relevant Orders   Hemoglobin A1c (Completed)   Urge incontinence    The current medical regimen is effective;  continue present plan and medications.       Need for immunization against influenza   Relevant Orders   Flu Vaccine QUAD High Dose(Fluad) (Completed)   Mixed hyperlipidemia    Well controlled.  No changes to medicines. Praluent injection '75mg'$ /ml once every two weeks. Continue to work on eating a healthy diet and exercise.  Labs drawn today.        Relevant Medications   gemfibrozil (LOPID) 600 MG  tablet   Omega 3 1000 MG CAPS   Other Relevant Orders   Lipid panel (Completed)  .  Meds ordered this encounter  Medications   Omega 3 1000 MG CAPS    Sig: Take 2 capsules (2,000 mg total) by mouth daily.    Dispense:  180 capsule    Refill:  1   fluticasone-salmeterol (WIXELA INHUB) 250-50 MCG/ACT AEPB    Sig: Inhale 1 puff into the lungs in the morning and at bedtime.    Dispense:  3 each    Refill:  1   cetirizine (ZYRTEC) 10 MG tablet    Sig: Take 1 tablet (10 mg total) by mouth daily.    Dispense:  100 tablet    Refill:  0    Orders Placed This Encounter  Procedures   Flu Vaccine QUAD High Dose(Fluad)   Comprehensive metabolic panel   Hemoglobin A1c   Lipid panel   CBC with Differential/Platelet   Cardiovascular Risk Assessment     Follow-up: Return in about 3 months (around 10/05/2022).  An After Visit Summary was printed and given to the patient.  Rochel Brome, MD [facility] [phone]

## 2022-07-05 NOTE — Assessment & Plan Note (Signed)
Hemoglobin A1c 5.6%, 3 month avg of blood sugars, is in prediabetic range.  In order to prevent progression to diabetes, recommend low carb diet and regular exercise  

## 2022-07-05 NOTE — Assessment & Plan Note (Signed)
>>  ASSESSMENT AND PLAN FOR PREDIABETES WRITTEN ON 07/05/2022  8:56 PM BY LEAL-BORJAS, Anicka Stuckert I, CMA  Hemoglobin A1c 5.6%, 3 month avg of blood sugars, is in prediabetic range.  In order to prevent progression to diabetes, recommend low carb diet and regular exercise

## 2022-07-05 NOTE — Assessment & Plan Note (Addendum)
Well controlled.  No changes to medicines. Praluent injection '75mg'$ /ml once every two weeks. Continue to work on eating a healthy diet and exercise.  Labs drawn today.

## 2022-07-05 NOTE — Assessment & Plan Note (Addendum)
The current medical regimen is effective;  continue present plan and medications.  wixela1 puff twice daily 

## 2022-07-05 NOTE — Assessment & Plan Note (Addendum)
The current medical regimen is effective;  continue present plan and medications. omeprazole 20 mg daily.  

## 2022-07-05 NOTE — Assessment & Plan Note (Addendum)
Well controlled.  No changes to medicines. Losartan 50mg once daily.  Continue to work on eating a healthy diet and exercise.  Labs drawn today.   

## 2022-07-06 ENCOUNTER — Ambulatory Visit (INDEPENDENT_AMBULATORY_CARE_PROVIDER_SITE_OTHER): Payer: Medicare HMO | Admitting: Family Medicine

## 2022-07-06 VITALS — BP 140/70 | HR 95 | Temp 97.3°F | Resp 16 | Ht 71.0 in | Wt 255.0 lb

## 2022-07-06 DIAGNOSIS — K219 Gastro-esophageal reflux disease without esophagitis: Secondary | ICD-10-CM

## 2022-07-06 DIAGNOSIS — I10 Essential (primary) hypertension: Secondary | ICD-10-CM | POA: Diagnosis not present

## 2022-07-06 DIAGNOSIS — N400 Enlarged prostate without lower urinary tract symptoms: Secondary | ICD-10-CM

## 2022-07-06 DIAGNOSIS — N3941 Urge incontinence: Secondary | ICD-10-CM | POA: Diagnosis not present

## 2022-07-06 DIAGNOSIS — R7303 Prediabetes: Secondary | ICD-10-CM | POA: Diagnosis not present

## 2022-07-06 DIAGNOSIS — E782 Mixed hyperlipidemia: Secondary | ICD-10-CM

## 2022-07-06 DIAGNOSIS — E559 Vitamin D deficiency, unspecified: Secondary | ICD-10-CM

## 2022-07-06 DIAGNOSIS — Z23 Encounter for immunization: Secondary | ICD-10-CM

## 2022-07-06 DIAGNOSIS — J449 Chronic obstructive pulmonary disease, unspecified: Secondary | ICD-10-CM | POA: Diagnosis not present

## 2022-07-06 MED ORDER — FLUTICASONE-SALMETEROL 250-50 MCG/ACT IN AEPB: 1.0000 | INHALATION_SPRAY | Freq: Two times a day (BID) | RESPIRATORY_TRACT | 1 refills | Status: AC

## 2022-07-06 MED ORDER — CETIRIZINE HCL 10 MG PO TABS: 10.0000 mg | ORAL_TABLET | Freq: Every day | ORAL | 0 refills | Status: AC

## 2022-07-06 MED ORDER — OMEGA 3 1000 MG PO CAPS: 2000.0000 mg | ORAL_CAPSULE | Freq: Every day | ORAL | 1 refills | Status: DC

## 2022-07-07 ENCOUNTER — Other Ambulatory Visit: Payer: Self-pay

## 2022-07-07 LAB — CBC WITH DIFFERENTIAL/PLATELET
Basophils Absolute: 0 10*3/uL (ref 0.0–0.2)
Basos: 1 %
EOS (ABSOLUTE): 0.3 10*3/uL (ref 0.0–0.4)
Eos: 7 %
Hematocrit: 48.7 % (ref 37.5–51.0)
Hemoglobin: 15.8 g/dL (ref 13.0–17.7)
Immature Grans (Abs): 0 10*3/uL (ref 0.0–0.1)
Immature Granulocytes: 0 %
Lymphocytes Absolute: 1.2 10*3/uL (ref 0.7–3.1)
Lymphs: 29 %
MCH: 26.3 pg — ABNORMAL LOW (ref 26.6–33.0)
MCHC: 32.4 g/dL (ref 31.5–35.7)
MCV: 81 fL (ref 79–97)
Monocytes Absolute: 0.6 10*3/uL (ref 0.1–0.9)
Monocytes: 13 %
Neutrophils Absolute: 2.1 10*3/uL (ref 1.4–7.0)
Neutrophils: 50 %
Platelets: 193 10*3/uL (ref 150–450)
RBC: 6 x10E6/uL — ABNORMAL HIGH (ref 4.14–5.80)
RDW: 15.5 % — ABNORMAL HIGH (ref 11.6–15.4)
WBC: 4.2 10*3/uL (ref 3.4–10.8)

## 2022-07-07 LAB — COMPREHENSIVE METABOLIC PANEL
ALT: 14 IU/L (ref 0–44)
AST: 18 IU/L (ref 0–40)
Albumin/Globulin Ratio: 1.7 (ref 1.2–2.2)
Albumin: 4.3 g/dL (ref 3.7–4.7)
Alkaline Phosphatase: 53 IU/L (ref 44–121)
BUN/Creatinine Ratio: 13 (ref 10–24)
BUN: 14 mg/dL (ref 8–27)
Bilirubin Total: 0.8 mg/dL (ref 0.0–1.2)
CO2: 20 mmol/L (ref 20–29)
Calcium: 9.6 mg/dL (ref 8.6–10.2)
Chloride: 105 mmol/L (ref 96–106)
Creatinine, Ser: 1.04 mg/dL (ref 0.76–1.27)
Globulin, Total: 2.6 g/dL (ref 1.5–4.5)
Glucose: 151 mg/dL — ABNORMAL HIGH (ref 70–99)
Potassium: 4.6 mmol/L (ref 3.5–5.2)
Sodium: 142 mmol/L (ref 134–144)
Total Protein: 6.9 g/dL (ref 6.0–8.5)
eGFR: 72 mL/min/{1.73_m2} (ref >59)

## 2022-07-07 LAB — LIPID PANEL
Chol/HDL Ratio: 1.7 ratio (ref 0.0–5.0)
Cholesterol, Total: 67 mg/dL — ABNORMAL LOW (ref 100–199)
HDL: 39 mg/dL — ABNORMAL LOW (ref >39)
LDL Chol Calc (NIH): 7 mg/dL (ref 0–99)
Triglycerides: 111 mg/dL (ref 0–149)
VLDL Cholesterol Cal: 21 mg/dL (ref 5–40)

## 2022-07-07 LAB — HEMOGLOBIN A1C
Est. average glucose Bld gHb Est-mCnc: 146 mg/dL
Hgb A1c MFr Bld: 6.7 % — ABNORMAL HIGH (ref 4.8–5.6)

## 2022-07-07 LAB — CARDIOVASCULAR RISK ASSESSMENT

## 2022-07-07 MED ORDER — METFORMIN HCL 500 MG PO TABS: 1000.0000 mg | ORAL_TABLET | Freq: Every day | ORAL | 0 refills | Status: DC

## 2022-07-09 ENCOUNTER — Encounter: Payer: Self-pay | Admitting: Family Medicine

## 2022-07-09 NOTE — Assessment & Plan Note (Signed)
Continue Trospium 20 mg once daily and Tamsulosin 0.4 mg before bed.   

## 2022-07-09 NOTE — Assessment & Plan Note (Signed)
The current medical regimen is effective;  continue present plan and medications.  

## 2022-07-09 NOTE — Assessment & Plan Note (Signed)
Check labs 

## 2022-07-10 ENCOUNTER — Telehealth: Payer: Self-pay

## 2022-07-10 NOTE — Progress Notes (Signed)
Chronic Care Management Pharmacy Assistant   Name: Christian Lowe.  MRN: [id number] DOB: [birth year]   Reason for Encounter: Disease State/General  Recent office visits:  07-06-2022 Rochel Brome, MD. RBC= 6.00, MCH= 26.3, RDW= 15.5. Glucose= 151. A1C= 6.7. Cholesterol= 67, HDL= 39. START zyrtec 10 mg daily, wixela 1 puff twice daily and fish oil 2,000 mg daily. Flu vaccine given.  03-20-2022 Rochel Brome, MD. Glucose= 130. Cholesterol, Total= 80, HDL= 37. Cyclic Citrullin Peptide Ab= 65. START albuterol 2 puffs every 6 hours.  Recent consult visits:  07-07-2022 RIDDLE,STEPHANIE L ([facility]). Follow up visit.  06-02-2022 Park Liter, MD (Cardiology). STOP albuterol, praluent, vit D, wixela, claritin, naproxen, fish oil, prednisone, probiotic and flomax.  05-04-2022 Yvone Neu (Dermatology). Unable to view encounter.  04-17-2022 [facility] ([place] rheumatology). START Hydroxychloroquine  200 mg 2 tab Orally daily for 30 days. Hospital visits:  None in previous 6 months  Medications: Outpatient Encounter Medications as of 07/10/2022  Medication Sig   Alirocumab (PRALUENT) 75 MG/ML SOAJ INJECT 75 MLS INTO THE SKIN EVERY 14 (FOURTEEN) DAYS.   cetirizine (ZYRTEC) 10 MG tablet Take 1 tablet (10 mg total) by mouth daily.   Cetirizine-Pseudoephedrine (ALLERGY RELIEF D PO) Take 1 tablet by mouth as needed (allergies).   ezetimibe (ZETIA) 10 MG tablet Take 10 mg by mouth daily.   fluticasone-salmeterol (WIXELA INHUB) 250-50 MCG/ACT AEPB Inhale 1 puff into the lungs in the morning and at bedtime.   gemfibrozil (LOPID) 600 MG tablet Take 600 mg by mouth 2 (two) times daily before a meal.   losartan (COZAAR) 50 MG tablet Take 1 tablet (50 mg total) by mouth daily.   metFORMIN (GLUCOPHAGE) 500 MG tablet Take 2 tablets (1,000 mg total) by mouth daily.   Omega 3 1000 MG CAPS Take 2 capsules (2,000 mg total) by mouth daily.   omeprazole (PRILOSEC) 20 MG capsule Take 20 mg by  mouth daily.   rosuvastatin (CRESTOR) 40 MG tablet Take 20 mg by mouth daily.   tadalafil (CIALIS) 10 MG tablet Take 10 mg by mouth daily as needed for erectile dysfunction.   trospium (SANCTURA) 20 MG tablet Take 10 mg by mouth 2 (two) times daily.   No facility-administered encounter medications on file as of 07/10/2022.   Contacted [facility] for General Review Call   Chart Review:  Have there been any documented new, changed, or discontinued medications since last visit? No  Has there been any documented recent hospitalizations or ED visits since last visit with Clinical Pharmacist? No Brief Summary : None   Adherence Review:  Does the Clinical Pharmacist Assistant have access to adherence rates? Yes Adherence rates for STAR metric medications (View star ratings below). Adherence rates for medications indicated for disease state being reviewed (List medication(s)/day supply/ last 2 fill dates). Does the patient have >5 day gap between last estimated fill dates for any of the above medications or other medication gaps? No Reason for medication gaps. Do you receive your medications through PAP? No    Disease State Questions:  Able to connect with Patient? Yes  Did patient have any problems with their health recently? No  Have you had any admissions or emergency room visits or worsening of your condition(s) since last visit? No  Have you had any visits with new specialists or providers since your last visit? No  Have you had any new health care problem(s) since your last visit? No  Have you run out  of any of your medications since you last spoke with clinical pharmacist? No  Are there any medications you are not taking as prescribed? No  Are you having any issues or side effects with your medications? No  Do you have any other health concerns or questions you want to discuss with your Clinical Pharmacist before your next visit? No  Are there any health  concerns that you feel we can do a better job addressing? No  Are you having any problems with any of the following since the last visit: (select all that apply)  None  12. Any falls since last visit? No  13. Any increased or uncontrolled pain since last visit? No   14. Next visit Type: telephone       Visit with: Arizona Constable        Date: 08-16-2022        Time: 3:00 PM  15. Additional Details? No   Care Gaps: Yearly ophthalmology overdue Uacr overdue  Star Rating Drugs: Losrtan 50 mg- Last filled 05-23-2022 90 DS. Previous 02-23-2022 90 DS Metformin 500 mg- Last filled 05-07-2022 90 DS. Previous 01-27-2022 [facility] Clinical Pharmacist Assistant [phone]

## 2022-07-26 ENCOUNTER — Encounter: Payer: Self-pay | Admitting: Nurse Practitioner

## 2022-07-26 ENCOUNTER — Other Ambulatory Visit: Payer: Self-pay | Admitting: Nurse Practitioner

## 2022-07-26 ENCOUNTER — Ambulatory Visit (INDEPENDENT_AMBULATORY_CARE_PROVIDER_SITE_OTHER): Payer: Medicare HMO | Admitting: Nurse Practitioner

## 2022-07-26 VITALS — BP 124/64 | HR 80 | Temp 97.3°F | Ht 71.0 in | Wt 256.0 lb

## 2022-07-26 DIAGNOSIS — R0982 Postnasal drip: Secondary | ICD-10-CM

## 2022-07-26 DIAGNOSIS — J028 Acute pharyngitis due to other specified organisms: Secondary | ICD-10-CM

## 2022-07-26 MED ORDER — FLUTICASONE PROPIONATE 50 MCG/ACT NA SUSP: 2.0000 | Freq: Every day | NASAL | 6 refills | Status: DC

## 2022-07-26 MED ORDER — AZITHROMYCIN 250 MG PO TABS: ORAL_TABLET | ORAL | 0 refills | Status: AC

## 2022-07-26 NOTE — Progress Notes (Signed)
Acute Office Visit  Subjective:    Patient ID: Christian Holleman., male    DOB: [birth year], 81 y.o.   MRN: [id number]  CC: URI  HPI: Patient is in today for Upper respiratory symptoms He complains of post-nasal drip, nasal congestion, sinus pressure, and sore throat. Denies fever, chills, night sweats or body aches. Onset of symptoms was a few days ago and staying constant. Treatment has included Nyquil and drinking plenty of fluids.  Past history is significant for pneumonia. Patient is former smoker, quit over 50  years ago.  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   Epidermoid cyst of skin 10/31/2020   Fatigue 11/23/2020   Gastroesophageal reflux disease 10/31/2020   GERD (gastroesophageal reflux disease)    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   Hemorrhage of gastrointestinal tract 10/31/2020   History of basal cell carcinoma (BCC)    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   Joint pain 11/23/2020   Long-term current use of testosterone replacement therapy 11/23/2020   Low libido 11/23/2020   Mixed hyperlipidemia    Osteoarthritis    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   Polyp of colon 11/01/2020   Aug 31, 2015 Entered By: Alycia Rossetti B Comment: colonoscopy 05/2015 - needs repeat ASAP   Prediabetes    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   Sleep disturbance 11/23/2020   Status post craniotomy 02/10/2020    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   Urge incontinence of urine 11/23/2020   Vitamin D deficiency     Past Surgical History:  Procedure Laterality Date   CRANIOTOMY Left 02/10/2020   Procedure: CRANIOTOMY HEMATOMA EVACUATION SUBDURAL;  Surgeon: Earnie Larsson, MD;  Location: [facility];  Service: Neurosurgery;  Laterality: Left;   EYE SURGERY Bilateral 2012   cataracts w lens implants   PROSTATECTOMY  2008   BPH   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   TEMPORAL ARTERY BIOPSY / LIGATION Bilateral 1997   negative   TRANSURETHRAL RESECTION OF PROSTATE  2010    Family History  Problem Relation Age of Onset   Brain cancer Mother    CAD Father    Alcoholism Father    CAD Brother    Lung cancer Brother    CAD Brother     Social History   Socioeconomic History   Marital status: Widowed    Spouse name: Not on file   Number of children: Not on file   Years of education: Not on file   Highest education level: Not on file  Occupational History   Not on file  Tobacco Use   Smoking status: Former    Packs/day: 1.00    Years: 20.00    Total pack years: 20.00    Types: Cigarettes    Quit date: 01/06/1967    Years since quitting: 55.5   Smokeless tobacco: Never  Substance and  Sexual Activity   Alcohol use: Yes    Comment: occassionally   Drug use: No   Sexual activity: Not on file  Other Topics Concern   Not on file  Social History Narrative   Not on file   Social Determinants of Health   Financial Resource Strain: Not on file  Food Insecurity: No Food Insecurity (12/29/2021)   Hunger Vital Sign    Worried About Running Out of Food in the Last Year: Never true    Ran Out of Food in the Last Year: Never true  Transportation Needs: No Transportation Needs (12/29/2021)   PRAPARE - Hydrologist (Medical): No    Lack of Transportation (Non-Medical): No  Physical Activity: Not on file  Stress: Not on file   Social Connections: Not on file  Intimate Partner Violence: Not At Risk (12/29/2021)   Humiliation, Afraid, Rape, and Kick questionnaire    Fear of Current or Ex-Partner: No    Emotionally Abused: No    Physically Abused: No    Sexually Abused: No    Outpatient Medications Prior to Visit  Medication Sig Dispense Refill   Alirocumab (PRALUENT) 75 MG/ML SOAJ INJECT 75 MLS INTO THE SKIN EVERY 14 (FOURTEEN) DAYS. 6 mL 0   cetirizine (ZYRTEC) 10 MG tablet Take 1 tablet (10 mg total) by mouth daily. 100 tablet 0   Cetirizine-Pseudoephedrine (ALLERGY RELIEF D PO) Take 1 tablet by mouth as needed (allergies).     ezetimibe (ZETIA) 10 MG tablet Take 10 mg by mouth daily.     fluticasone-salmeterol (WIXELA INHUB) 250-50 MCG/ACT AEPB Inhale 1 puff into the lungs in the morning and at bedtime. 3 each 1   gemfibrozil (LOPID) 600 MG tablet Take 600 mg by mouth 2 (two) times daily before a meal.     losartan (COZAAR) 50 MG tablet Take 1 tablet (50 mg total) by mouth daily. 90 tablet 1   metFORMIN (GLUCOPHAGE) 500 MG tablet Take 2 tablets (1,000 mg total) by mouth daily. 90 tablet 0   Omega 3 1000 MG CAPS Take 2 capsules (2,000 mg total) by mouth daily. 180 capsule 1   omeprazole (PRILOSEC) 20 MG capsule Take 20 mg by mouth daily.     rosuvastatin (CRESTOR) 40 MG tablet Take 20 mg by mouth daily.     tadalafil (CIALIS) 10 MG tablet Take 10 mg by mouth daily as needed for erectile dysfunction.     trospium (SANCTURA) 20 MG tablet Take 10 mg by mouth 2 (two) times daily.     No facility-administered medications prior to visit.    Allergies  Allergen Reactions   Atorvastatin     Other reaction(s): Muscle pain   Codeine Other (See Comments)   Niacin Itching    Other reaction(s): Itching, Flushing   Piroxicam     Other reaction(s): Gastric ulcer with hemorrhage   Pravastatin     Other reaction(s): Muscle pain   Statins     myalgias   Zocor [Simvastatin]     Other reaction(s): Muscle pain     Review of Systems See pertinent positives and negatives per HPI.     Objective:    Physical Exam Vitals reviewed.  Constitutional:      Appearance: Normal appearance.  HENT:     Right Ear: Tympanic membrane normal.     Left Ear: Tympanic membrane normal.     Nose: Congestion and rhinorrhea present.     Mouth/Throat:     Pharynx:  Posterior oropharyngeal erythema present.  Cardiovascular:     Rate and Rhythm: Normal rate and regular rhythm.     Pulses: Normal pulses.     Heart sounds: Normal heart sounds.  Pulmonary:     Effort: Pulmonary effort is normal.     Breath sounds: Normal breath sounds.  Neurological:     Mental Status: He is alert.    Wt Readings from Last 3 Encounters:  07/06/22 255 lb (115.7 kg)  06/02/22 265 lb 3.2 oz (120.3 kg)  03/20/22 256 lb (116.1 kg)    Health Maintenance Due  Topic Date Due   OPHTHALMOLOGY EXAM  Never done   Diabetic kidney evaluation - Urine ACR  Never done   COVID-19 Vaccine (5 - 2023-24 season) 04/07/2022    Lab Results  Component Value Date   TSH 3.800 09/09/2021   Lab Results  Component Value Date   WBC 4.2 07/06/2022   HGB 15.8 07/06/2022   HCT 48.7 07/06/2022   MCV 81 07/06/2022   PLT 193 07/06/2022   Lab Results  Component Value Date   NA 142 07/06/2022   K 4.6 07/06/2022   CO2 20 07/06/2022   GLUCOSE 151 (H) 07/06/2022   BUN 14 07/06/2022   CREATININE 1.04 07/06/2022   BILITOT 0.8 07/06/2022   ALKPHOS 53 07/06/2022   AST 18 07/06/2022   ALT 14 07/06/2022   PROT 6.9 07/06/2022   ALBUMIN 4.3 07/06/2022   CALCIUM 9.6 07/06/2022   ANIONGAP 11 02/11/2020   EGFR 72 07/06/2022   Lab Results  Component Value Date   CHOL 67 (L) 07/06/2022   Lab Results  Component Value Date   HDL 39 (L) 07/06/2022   Lab Results  Component Value Date   LDLCALC 7 07/06/2022   Lab Results  Component Value Date   TRIG 111 07/06/2022   Lab Results  Component Value Date   CHOLHDL 1.7 07/06/2022   Lab Results   Component Value Date   HGBA1C 6.7 (H) 07/06/2022       Assessment & Plan:   1. Pharyngitis due to other organism - azithromycin (ZITHROMAX) 250 MG tablet; Take 2 tablets on day 1, then 1 tablet daily on days 2 through 5  Dispense: 6 tablet; Refill: 0  2. Post-nasal drip - fluticasone (FLONASE) 50 MCG/ACT nasal spray; Place 2 sprays into both nostrils daily.  Dispense: 16 g; Refill: 6     Warm salt water gargles Replace toothbrush and toothpaste Rest and push fluids Take Z-pack as directed, with food Follow-up as needed   Follow-up: PRN  An After Visit Summary was printed and given to the patient.  I, Rip Harbour, NP, have reviewed all documentation for this visit. The documentation on 07/26/22 for the exam, diagnosis, procedures, and orders are all accurate and complete.   Signed, Rip Harbour, NP [place] [phone]

## 2022-07-26 NOTE — Patient Instructions (Signed)
Warm salt water gargles Replace toothbrush and toothpaste Rest and push fluids Take Z-pack as directed, with food Follow-up as needed   Pharyngitis  Pharyngitis is a sore throat (pharynx). This is when there is redness, pain, and swelling in your throat. Most of the time, this condition gets better on its own. In some cases, you may need medicine. What are the causes? An infection from a virus. An infection from bacteria. Allergies. What increases the risk? Being 5-24 years old. Being in crowded environments. These include: Daycares. Schools. Dormitories. Living in a place with cold temperatures outside. Having a weakened disease-fighting (immune) system. What are the signs or symptoms? Symptoms may vary depending on the cause. Common symptoms include: Sore throat. Tiredness (fatigue). Low-grade fever. Stuffy nose. Cough. Headache. Other symptoms may include: Glands in the neck (lymph nodes) that are swollen. Skin rashes. Film on the throat or tonsils. This can be caused by an infection from bacteria. Vomiting. Red, itchy eyes. Loss of appetite. Joint pain and muscle aches. Tonsils that are temporarily bigger than usual (enlarged). How is this treated? Many times, treatment is not needed. This condition usually gets better in 3-4 days without treatment. If the infection is caused by a bacteria, you may be need to take antibiotics. Follow these instructions at home: Medicines Take over-the-counter and prescription medicines only as told by your doctor. If you were prescribed an antibiotic medicine, take it as told by your doctor. Do not stop taking the antibiotic even if you start to feel better. Use throat lozenges or sprays to soothe your throat as told by your doctor. Children can get pharyngitis. Do not give your child aspirin. Managing pain To help with pain, try: Sipping warm liquids, such as: Broth. Herbal tea. Warm water. Eating or drinking cold or frozen  liquids, such as frozen ice pops. Rinsing your mouth (gargle) with a salt water mixture 3-4 times a day or as needed. To make salt water, dissolve -1 tsp (3-6 g) of salt in 1 cup (237 mL) of warm water. Do not swallow this mixture. Sucking on hard candy or throat lozenges. Putting a cool-mist humidifier in your bedroom at night to moisten the air. Sitting in the bathroom with the door closed for 5-10 minutes while you run hot water in the shower.  General instructions  Do not smoke or use any products that contain nicotine or tobacco. If you need help quitting, ask your doctor. Rest as told by your doctor. Drink enough fluid to keep your pee (urine) pale yellow. How is this prevented? Wash your hands often for at least 20 seconds with soap and water. If soap and water are not available, use hand sanitizer. Do not touch your eyes, nose, or mouth with unwashed hands. Wash hands after touching these areas. Do not share cups or eating utensils. Avoid close contact with people who are sick. Contact a doctor if: You have large, tender lumps in your neck. You have a rash. You cough up green, yellow-brown, or bloody spit. Get help right away if: You have a stiff neck. You drool or cannot swallow liquids. You cannot drink or take medicines without vomiting. You have very bad pain that does not go away with medicine. You have problems breathing, and it is not from a stuffy nose. You have new pain and swelling in your knees, ankles, wrists, or elbows. These symptoms may be an emergency. Get help right away. Call your local emergency services (911 in the [place] Do not wait   to see if the symptoms will go away. Do not drive yourself to the hospital. Summary Pharyngitis is a sore throat (pharynx). This is when there is redness, pain, and swelling in your throat. Most of the time, pharyngitis gets better on its own. Sometimes, you may need medicine. If you were prescribed an antibiotic medicine,  take it as told by your doctor. Do not stop taking the antibiotic even if you start to feel better. This information is not intended to replace advice given to you by your health care provider. Make sure you discuss any questions you have with your health care provider. Document Revised: 10/20/2020 Document Reviewed: 10/20/2020 Elsevier Patient Education  2023 [employer]  

## 2022-07-27 ENCOUNTER — Other Ambulatory Visit: Payer: Self-pay | Admitting: Nurse Practitioner

## 2022-07-27 DIAGNOSIS — J028 Acute pharyngitis due to other specified organisms: Secondary | ICD-10-CM

## 2022-07-27 MED ORDER — AMOXICILLIN 875 MG PO TABS: 875.0000 mg | ORAL_TABLET | Freq: Two times a day (BID) | ORAL | 0 refills | Status: AC

## 2022-08-02 DIAGNOSIS — M79642 Pain in left hand: Secondary | ICD-10-CM | POA: Diagnosis not present

## 2022-08-02 DIAGNOSIS — R768 Other specified abnormal immunological findings in serum: Secondary | ICD-10-CM | POA: Diagnosis not present

## 2022-08-02 DIAGNOSIS — M1991 Primary osteoarthritis, unspecified site: Secondary | ICD-10-CM | POA: Diagnosis not present

## 2022-08-02 DIAGNOSIS — E669 Obesity, unspecified: Secondary | ICD-10-CM | POA: Diagnosis not present

## 2022-08-02 DIAGNOSIS — Z6834 Body mass index (BMI) 34.0-34.9, adult: Secondary | ICD-10-CM | POA: Diagnosis not present

## 2022-08-02 DIAGNOSIS — M79641 Pain in right hand: Secondary | ICD-10-CM | POA: Diagnosis not present

## 2022-08-02 DIAGNOSIS — M059 Rheumatoid arthritis with rheumatoid factor, unspecified: Secondary | ICD-10-CM | POA: Diagnosis not present

## 2022-08-05 ENCOUNTER — Other Ambulatory Visit: Payer: Self-pay | Admitting: Family Medicine

## 2022-08-08 ENCOUNTER — Telehealth: Payer: Self-pay

## 2022-08-08 MED ORDER — METOPROLOL TARTRATE 25 MG PO TABS: 25.0000 mg | ORAL_TABLET | Freq: Two times a day (BID) | ORAL | 2 refills | Status: DC

## 2022-08-08 NOTE — Telephone Encounter (Signed)
-----   Message from Park Liter, MD sent at 07/20/2022  3:34 PM EST ----- Monitor shows some ventricular ectopy.  Lets get metoprolol titrate 25 mg twice daily

## 2022-08-08 NOTE — Telephone Encounter (Signed)
Patient notified of results and recommendations and agreed with plan. Medication sent to requested pharmacy

## 2022-08-16 ENCOUNTER — Ambulatory Visit: Payer: Medicare HMO

## 2022-08-16 DIAGNOSIS — R768 Other specified abnormal immunological findings in serum: Secondary | ICD-10-CM | POA: Diagnosis not present

## 2022-08-16 DIAGNOSIS — E669 Obesity, unspecified: Secondary | ICD-10-CM | POA: Diagnosis not present

## 2022-08-16 DIAGNOSIS — M1991 Primary osteoarthritis, unspecified site: Secondary | ICD-10-CM | POA: Diagnosis not present

## 2022-08-16 DIAGNOSIS — M79642 Pain in left hand: Secondary | ICD-10-CM | POA: Diagnosis not present

## 2022-08-16 DIAGNOSIS — Z6834 Body mass index (BMI) 34.0-34.9, adult: Secondary | ICD-10-CM | POA: Diagnosis not present

## 2022-08-16 DIAGNOSIS — M79641 Pain in right hand: Secondary | ICD-10-CM | POA: Diagnosis not present

## 2022-08-16 NOTE — Patient Outreach (Signed)
Care Management & Coordination Services Pharmacy Note  08/16/2022 Name:  Christian Lowe. MRN:  [id number] DOB:  1941/05/16  Summary: Pleasant 82 year old male presents for visit. 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  Recommendations/Changes made from today's visit: -Patient has no Urine Albumin on file. Recommend adding to labs -LDL is 7 and patient is 82 years old and would like to decrease pill burden. Recommend Dc'ing Gemfibrozil and/or Ezetimibe. -Patient no longer taking Pseudophed allergy med. Removed from meds list. -Patient is taking a whole Rosuvastatin instead of the half. Will cosign PCP to ask if that's satisfactory -Patient is starting Celecoxib. Added to meds list (Rheum DC hydroxychloroquine) -PCP note said it was OK for patient to increase Metformin dose. Still written as '500mg'$  QD on Epic. Updated medslist. Patient also started Empagliflozin, [facility]sl[facility]  Subjective: Christian Lowe. is an 82 y.o. year old male who is a primary patient of Cox, Kirsten, MD.  The care coordination team was consulted for assistance with disease management and care coordination needs.    Engaged with patient by telephone for follow up visit.  Patient Care Team: Rochel Brome, MD as PCP - General (Family Medicine) Lane Hacker, [facility] as Pharmacist (Pharmacist) [place], [place]  Recent office visits:  07-06-2022 Rochel Brome, MD. RBC= 6.00, MCH= 26.3, RDW= 15.5. Glucose= 151. A1C= 6.7. Cholesterol= 67, HDL= 39. START zyrtec 10 mg daily, wixela 1 puff twice daily and fish oil 2,000 mg daily. Flu vaccine given.   03-20-2022 Rochel Brome, MD. Glucose= 130. Cholesterol, Total= 80, HDL= 37. Cyclic Citrullin Peptide Ab= 65. START albuterol 2 puffs every 6 hours.   Recent consult visits:  07-07-2022 RIDDLE,STEPHANIE L ([facility]). Follow up visit.   06-02-2022 Park Liter, MD  (Cardiology). STOP albuterol, praluent, vit D, wixela, claritin, naproxen, fish oil, prednisone, probiotic and flomax.   05-04-2022 Yvone Neu (Dermatology). Unable to view encounter.   04-17-2022 Yetta Flock [facility] ([place] rheumatology). START Hydroxychloroquine  200 mg 2 tab Orally daily for 30 days. Hospital visits:  None in previous 6 months   Objective:  Lab Results  Component Value Date   CREATININE 1.04 07/06/2022   BUN 14 07/06/2022   EGFR 72 07/06/2022   GFRNONAA 68 07/27/2020   GFRAA 79 07/27/2020   NA 142 07/06/2022   K 4.6 07/06/2022   CALCIUM 9.6 07/06/2022   CO2 20 07/06/2022   GLUCOSE 151 (H) 07/06/2022    Lab Results  Component Value Date/Time   HGBA1C 6.7 (H) 07/06/2022 08:42 AM   HGBA1C 5.6 03/20/2022 09:19 AM    Last diabetic Eye exam: No results found for: "HMDIABEYEEXA"  Last diabetic Foot exam: No results found for: "HMDIABFOOTEX"   Lab Results  Component Value Date   CHOL 67 (L) 07/06/2022   HDL 39 (L) 07/06/2022   LDLCALC 7 07/06/2022   TRIG 111 07/06/2022   CHOLHDL 1.7 07/06/2022       Latest Ref Rng & Units 07/06/2022    8:42 AM 03/20/2022    9:19 AM 12/14/2021    9:01 AM  Hepatic Function  Total Protein 6.0 - 8.5 g/dL 6.9  6.3  6.9   Albumin 3.7 - 4.7 g/dL 4.3  4.0  4.4   AST 0 - 40 IU/L '18  14  19   '$ ALT 0 - 44 IU/L 14  12  21  Alk Phosphatase 44 - 121 IU/L 53  44  57   Total Bilirubin 0.0 - 1.2 mg/dL 0.8  0.8  0.7     Lab Results  Component Value Date/Time   TSH 3.800 09/09/2021 08:42 AM   TSH 2.030 07/27/2020 09:02 AM       Latest Ref Rng & Units 07/06/2022    8:42 AM 03/20/2022    9:19 AM 12/14/2021    9:01 AM  CBC  WBC 3.4 - 10.8 x10E3/uL 4.2  5.3  4.5   Hemoglobin 13.0 - 17.7 g/dL 15.8  15.6  16.3   Hematocrit 37.5 - 51.0 % 48.7  47.1  48.6   Platelets 150 - 450 x10E3/uL 193  187  174     Lab Results  Component Value Date/Time   VD25OH 27.4 (L) 09/26/2021 04:28 PM   VITAMINB12 1,092 07/27/2020 09:02 AM     Clinical ASCVD: No  The ASCVD Risk score (Arnett DK, et al., 2019) failed to calculate for the following reasons:   The 2019 ASCVD risk score is only valid for ages 66 to 24       12/29/2021    2:47 PM 12/14/2021    8:23 AM 09/09/2021    8:13 AM  Depression screen PHQ 2/9  Decreased Interest 0 0 0  Down, Depressed, Hopeless 0 0 0  PHQ - 2 Score 0 0 0     Other: (CHADS2VASc if Afib, MMRC or CAT for COPD, ACT, DEXA)  Social History   Tobacco Use  Smoking Status Former   Packs/day: 1.00   Years: 20.00   Total pack years: 20.00   Types: Cigarettes   Quit date: 01/06/1967   Years since quitting: 55.6  Smokeless Tobacco Never   BP Readings from Last 3 Encounters:  07/26/22 124/64  07/06/22 (!) 140/70  06/02/22 (!) 166/70   Pulse Readings from Last 3 Encounters:  07/26/22 80  07/06/22 95  06/02/22 73   Wt Readings from Last 3 Encounters:  07/26/22 256 lb (116.1 kg)  07/06/22 255 lb (115.7 kg)  06/02/22 265 lb 3.2 oz (120.3 kg)   BMI Readings from Last 3 Encounters:  07/26/22 35.70 kg/m  07/06/22 35.57 kg/m  06/02/22 36.99 kg/m    Allergies  Allergen Reactions   Atorvastatin     Other reaction(s): Muscle pain   Codeine Other (See Comments)   Niacin Itching    Other reaction(s): Itching, Flushing   Piroxicam     Other reaction(s): Gastric ulcer with hemorrhage   Pravastatin     Other reaction(s): Muscle pain   Statins     myalgias   Zocor [Simvastatin]     Other reaction(s): Muscle pain    Medications Reviewed Today     Reviewed by Lane Hacker, [facility] (Pharmacist) on 08/16/22 at [place] List Status: <None>   Medication Order Taking? Sig Documenting Provider Last Dose Status Informant  Alirocumab (PRALUENT) 75 MG/ML SOAJ [id number]  INJECT 75 MLS INTO THE SKIN EVERY 14 (FOURTEEN) DAYS. Cox, Kirsten, MD  Active   celecoxib (CELEBREX) 100 MG capsule [id number] Yes Take 100 mg by mouth daily. [provider] Taking Active   cetirizine  (ZYRTEC) 10 MG tablet [id number] Yes Take 1 tablet (10 mg total) by mouth daily. Cox, Kirsten, MD Taking Active   empagliflozin (JARDIANCE) 25 MG TABS tablet [id number] Yes Take 0.5 tablets by mouth every morning. [provider] Taking Active   ezetimibe (ZETIA) 10 MG tablet [id number] Yes Take 10  mg by mouth daily. [provider] Taking Active   fluticasone (FLONASE) 50 MCG/ACT nasal spray [id number] Yes Place 2 sprays into both nostrils daily. Rip Harbour, NP Taking Active   fluticasone-salmeterol [facility] INHUB) 250-50 MCG/ACT AEPB [id number] Yes Inhale 1 puff into the lungs in the morning and at bedtime. Cox, Kirsten, MD Taking Active   gemfibrozil (LOPID) 600 MG tablet [id number] Yes Take 600 mg by mouth 2 (two) times daily before a meal. [provider] Taking Active   losartan (COZAAR) 50 MG tablet [id number] Yes Take 1 tablet (50 mg total) by mouth daily. Cox, Kirsten, MD Taking Active   metFORMIN (GLUCOPHAGE) 500 MG tablet [id number] Yes Take 500 mg by mouth 2 (two) times daily with a meal. [provider] Taking Active   metoprolol tartrate (LOPRESSOR) 25 MG tablet [id number] Yes Take 1 tablet (25 mg total) by mouth 2 (two) times daily. Park Liter, MD Taking Active   Omega 3 1000 MG CAPS [id number] Yes Take 2 capsules (2,000 mg total) by mouth daily. Cox, Kirsten, MD Taking Active   omeprazole (PRILOSEC) 20 MG capsule [id number] Yes Take 20 mg by mouth daily. [provider] Taking Active   rosuvastatin (CRESTOR) 40 MG tablet [id number] Yes Take 40 mg by mouth daily. [provider] Taking Active   tadalafil (CIALIS) 10 MG tablet [id number]  Take 10 mg by mouth daily as needed for erectile dysfunction. [provider]  Active   trospium (SANCTURA) 20 MG tablet [id number] Yes Take 10 mg by mouth 2 (two) times daily. [provider] Taking Active             Patient Active Problem List   Diagnosis Date Noted    Stiffness of unspecified hand, not elsewhere classified 06/02/2022   Seropositive rheumatoid arthritis ([place]) 06/02/2022   Encounter for immunization 23/55/7322   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/2022   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   Mixed hyperlipidemia 12/14/2021   Myalgia due to statin 12/14/2021   Asthma 09/09/2021   Aortic atherosclerosis ([place]) 07/10/2021   Essential hypertension, benign 06/10/2021   Need for immunization against influenza 06/10/2021   Class 2 severe obesity due to excess calories with serious comorbidity and body mass index (BMI) of 35.0 to 35.9 in adult (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/2021   Encounter for fitting and adjustment of hearing aid 03/22/2021   History of traumatic brain injury 01/24/2021   Body mass index (BMI) 33.0-33.9, adult 11/23/2020   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   Fatigue 11/23/2020   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   Joint pain 11/23/2020   Long-term current use of testosterone replacement therapy 11/23/2020   Low libido 11/23/2020   Sleep disturbance 11/23/2020   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   Urge incontinence 11/23/2020   BPPV (benign paroxysmal positional vertigo)    Calculus of kidney    History of basal cell carcinoma (BCC)    Osteoarthritis    Prediabetes    Polyp of colon 11/01/2020   Vitamin D deficiency 11/01/2020   Benign prostatic hyperplasia 10/31/2020   Actinic keratosis 10/31/2020   Cataract extraction status 10/31/2020   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   Epidermoid cyst of skin 10/31/2020   GERD without esophagitis 10/31/2020   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   Status post craniotomy 02/10/2020   Closed fracture of body of  sternum 11/10/2019   Contusion of right thigh 11/10/2019    Immunization History   Administered Date(s) Administered   Fluad Quad(high Dose 65+) 04/08/2020, 06/09/2021, 07/06/2022   Influenza Split 05/08/2015, 04/26/2016   Influenza, High Dose Seasonal PF 05/07/2017   Influenza, Seasonal, Injecte, Preservative Fre 05/05/2015   Influenza-Unspecified 06/07/2001, 05/07/2002, 05/07/2004, 06/07/2006, 06/08/2007, 05/25/2008, 05/16/2009, 05/07/2010, 05/07/2013, 04/07/2014, 05/21/2021   Moderna Covid-19 Vaccine Bivalent Booster 46yr & up 11/28/2021   PFIZER Comirnaty(Gray Top)Covid-19 Tri-Sucrose Vaccine 12/15/2020   PFIZER(Purple Top)SARS-COV-2 Vaccination 07/28/2020, 08/19/2020   PNEUMOCOCCAL CONJUGATE-20 10/10/2021   Pneumococcal Conjugate-13 10/04/2016   Pneumococcal Polysaccharide-23 06/07/2005, 04/17/2018   Td 04/07/2018   Tdap 03/13/2014, 04/17/2018   Zoster Recombinat (Shingrix) 07/18/2021, 09/20/2021   Zoster, Live 09/24/2014     Compliance/Adherence/Medication fill history: Care Gaps: Yearly ophthalmology overdue Uacr overdue   Star Rating Drugs: Losrtan 50 mg- Last filled 05-23-2022 90 DS. Previous 02-23-2022 90 DS Metformin 500 mg- Last filled 05-07-2022 90 DS. Previous 01-27-2022 90 DS  SDOH:  (Social Determinants of Health) assessments and interventions performed: Yes SDOH Interventions    Flowsheet Row Care Coordination from 08[place]16/2022 [place]20[place]    Food Insecurity Interventions -- Intervention Not Indicated  Housing Interventions Intervention Not Indicated Intervention Not Indicated  Transportation Interventions Intervention Not Indicated Intervention Not Indicated      SDOH Screenings   Food Insecurity: No Food Insecurity (12/29/2021)  Housing: Low Risk  (08/16/2022)  Transportation Needs: No Transportation Needs (08/16/2022)  Alcohol Screen: Low Risk  (12/29/2021)  Depression (PHQ2-9): Low Risk  (12/29/2021)  Tobacco Use: Medium Risk (07/26/2022)     Medication Assistance: None required.  Patient affirms current coverage meets needs.  Medication Access: Within the past 30 days, how often has patient missed a dose of medication? No Is a pillbox or other method used to improve adherence? No  Factors that may affect medication adherence? no barriers identified Are meds synced by current pharmacy? No  Are meds delivered by current pharmacy? No  Does patient experience delays in picking up medications due to transportation concerns? No   Upstream Services Reviewed: Is patient disadvantaged to use [facility] Pharmacy?: Yes  Current Rx insurance plan: [place]ricare/[place] Name and location of Current pharmacy:  [id number] [place] [address] [place]: [phone]: [address] [address] [place]one: [phone]: [phone] [address]one: [phone]: [id number] [place] [place]: [phone]: [phone] Pharmacy services reviewed with patient today?: No  Patient requests to transfer care to Upstream Pharmacy?: No  Reason patient declined to change pharmacies: [facility] Bolivartient   Assessment/Plan   Hypertension (BP goal <130/80) BP Readings from Last 3 Encounters:  07/26/22 124/64  07/06/22 (!) 140/70  06/02/22 (!) 166/70  -Controlled -Current treatment: Losartan '50mg'$  Appropriate, Effective, Safe, Accessible Metoprolol Tart '25mg'$  BID Appropriate, Effective, Safe, Accessible -Medications previously tried: N/A  -Current home readings: Doesn't  test -Current dietary habits: Didn't answer, just said he likes chocolate and sweet tea -Current exercise habits: none -Denies hypotensive/hypertensive  symptoms -Educated on BP goals and benefits of medications for prevention of heart attack, stroke and kidney damage; -Counseled to monitor BP at home prn, document, and provide log at future appointments -Recommended to continue current medication  Diabetes (A1c goal <7%) Lab Results  Component Value Date   HGBA1C 6.7 (H) 07/06/2022   HGBA1C 5.6 03/20/2022   HGBA1C 6.2 (H) 12/14/2021   Lab Results  Component Value Date   LDLCALC 7 07/06/2022   CREATININE 1.04 07/06/2022    Lab Results  Component Value Date   NA 142 07/06/2022   K 4.6 07/06/2022   CREATININE 1.04 07/06/2022   EGFR 72 07/06/2022   GFRNONAA 68 07/27/2020   GLUCOSE 151 (H) 07/06/2022    Lab Results  Component Value Date   WBC 4.2 07/06/2022   HGB 15.8 07/06/2022   HCT 48.7 07/06/2022   MCV 81 07/06/2022   PLT 193 07/06/2022    No results found for: "LABMICR", "MICROALBUR"  -Controlled -Current medications: Empagliflozin '25mg'$  take 1/2 QD Appropriate, Effective, Safe, Accessible Metformin '500mg'$  BID Appropriate, Effective, Safe, Accessible -Medications previously tried: N/A  -Current home glucose readings fasting glucose:  Jan 2024: Didn't have readings on him -Denies hypoglycemic/hyperglycemic symptoms -Current meal patterns:  breakfast: Didn't answer  lunch: Didn't answer  dinner: Didn't answer snacks: Chocolate drinks: Sweetened ice tea -Current exercise: none -Educated on A1c and blood sugar goals; -Counseled to check feet daily and get yearly eye exams Jan 2024: [place] started him on Empagliflozin. Spoke with him at length about his concerns (He hasn't started taking yet). He said he'd start after our discussion.   Hyperlipidemia: (LDL goal < 70) -Not ideally controlled -Current treatment: Praluent '75mg'$  every 14 days Appropriate, Effective,  Safe, Accessible Ezetimibe '10mg'$  Query Appropriate, , ,  Gemfibrazil '600mg'$  BID Query Appropriate,  Fish Oil '1000mg'$  2QD Appropriate, Effective, Safe, Accessible Rosuvastatin '40mg'$  take 1/2 tablet daily Query Appropriate,  -Medications previously tried: N/A  -Current dietary patterns: "Tries to eat healthy" -Current exercise habits: none -Educated on Cholesterol goals;  Jan 2024: Cholesterol is very low. Patient experiencing high pill burden. Will ask PCP about Dc'ing Ezetimibe/Gemfibrazil  Chronic Pain -Controlled -Current treatment: Celecoxib '100mg'$  BID Appropriate, Effective, Safe, Accessible Patient stated he isn't taking any other pain meds, even OTC -Medications previously tried: Hydroxychloroquine  -Pain Scale There were no vitals filed for this visit.  Aggravating Factors: movement  Pain Type: Chronic  Jan 2024: Updated meds list   CPP F/U PRN  [place] Constable, Pharm.D. - [phone]

## 2022-09-01 ENCOUNTER — Other Ambulatory Visit: Payer: Self-pay

## 2022-09-01 DIAGNOSIS — M79609 Pain in unspecified limb: Secondary | ICD-10-CM | POA: Insufficient documentation

## 2022-09-01 DIAGNOSIS — R0602 Shortness of breath: Secondary | ICD-10-CM | POA: Insufficient documentation

## 2022-09-01 HISTORY — DX: Shortness of breath: R06.02

## 2022-09-01 HISTORY — DX: Pain in unspecified limb: M79.609

## 2022-09-03 ENCOUNTER — Other Ambulatory Visit: Payer: Self-pay | Admitting: Family Medicine

## 2022-09-04 ENCOUNTER — Other Ambulatory Visit: Payer: Self-pay | Admitting: Family Medicine

## 2022-09-05 ENCOUNTER — Encounter: Payer: Self-pay | Admitting: Cardiology

## 2022-09-05 ENCOUNTER — Ambulatory Visit: Payer: Medicare HMO | Attending: Cardiology | Admitting: Cardiology

## 2022-09-05 VITALS — BP 156/64 | HR 71 | Ht 71.0 in | Wt 241.4 lb

## 2022-09-05 DIAGNOSIS — R7303 Prediabetes: Secondary | ICD-10-CM | POA: Diagnosis not present

## 2022-09-05 DIAGNOSIS — I10 Essential (primary) hypertension: Secondary | ICD-10-CM | POA: Diagnosis not present

## 2022-09-05 DIAGNOSIS — I493 Ventricular premature depolarization: Secondary | ICD-10-CM

## 2022-09-05 NOTE — Progress Notes (Signed)
Cardiology Office Note:    Date:  09/05/2022   ID:  Christian Lowe., DOB August 12, 1940, MRN [id number]  PCP:  Rochel Brome, MD  Cardiologist:  Jenne Campus, MD    Referring MD: Rochel Brome, MD   Chief Complaint  Patient presents with   Palpitations    Occasional not frequent    History of Present Illness:    Christian Lowe. is a 82 y.o. male  with past medical history significant for essential hypertension, dyslipidemia with intolerance to multiple medications now he is on PCSK9 agent that he is LDL is very well controlled, calcification of the coronary artery noted on the CT of the abdomen couple years ago.  He was referred to [place] because he went to donate platelets like he does for many years he was noted to have skipped beats and he was sent to [place].  He is EKG showed PVCs with origin from LV.  Monitor he wear show a lot of burden of PVCs of 10.9%, no sustained arrhythmia, echocardiogram showed low normal ejection fraction.  He comes today to follow-up.  I did give him small dose of beta-blocker.  He takes right now 25 mg metoprolol titrate twice daily.  He cannot tell me for sure if palpitation helping a lot because he did not feel much of extrasystole.  Denies have any chest pain tightness squeezing pressure burning chest.  He is upset about the fact he cannot donate platelets anymore.  Past Medical History:  Diagnosis Date   Actinic keratosis 10/31/2020   Benign prostatic hyperplasia 10/31/2020   Mar 13, 2014 Entered By: Alycia Rossetti B Comment: s/p TURP 2010   Body mass index (BMI) 33.0-33.9, adult 11/23/2020   BPPV (benign paroxysmal positional vertigo)    Calculus of kidney    Calculus of kidney    Cancer ([place]) 2015   skin cancer removed off left forearm    Cataract extraction status 10/31/2020   Apr 17, 2009 Entered By: Alinda Dooms Comment: bilateral   Class 2 severe obesity due to excess calories with serious comorbidity and body mass index (BMI) of 35.0  to 35.9 in adult ([place]) 06/10/2021   Closed fracture of body of sternum 11/10/2019   Contusion of right thigh 4/0/1027   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/2022   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   Encounter for fitting and adjustment of hearing aid 03/22/2021   Encounter for immunization 06/02/2022   Epidermoid cyst of skin 10/31/2020   Fatigue 11/23/2020   Gastroesophageal reflux disease 10/31/2020   GERD (gastroesophageal reflux disease)    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   Hemorrhage of gastrointestinal tract 10/31/2020   History of basal cell carcinoma (BCC)    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   Joint pain 11/23/2020   Long-term current use of testosterone replacement therapy 11/23/2020   Low libido 11/23/2020   Mixed hyperlipidemia    Myalgia due to statin 12/14/2021   Need for immunization against influenza 06/10/2021   Osteoarthritis    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   Pain in limb 09/01/2022   Polyp of colon 11/01/2020   Aug 31, 2015 Entered By: Alycia Rossetti B Comment: colonoscopy 05/2015 - needs repeat ASAP   Prediabetes    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/26/2020   Shortness of breath 09/01/2022   Sleep disturbance 11/23/2020   Status post craniotomy 02/10/2020   Stiffness of unspecified hand, not elsewhere classified 06/02/2022   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  Urge incontinence 11/23/2020   Urge incontinence of urine 11/23/2020   Vitamin D deficiency     Past Surgical History:  Procedure Laterality Date   CRANIOTOMY Left 02/10/2020   Procedure: CRANIOTOMY HEMATOMA EVACUATION SUBDURAL;  Surgeon: Earnie Larsson, MD;  Location: [facility];  Service: Neurosurgery;  Laterality: Left;   EYE SURGERY Bilateral 2012   cataracts w lens implants   PROSTATECTOMY  2008   BPH   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   TEMPORAL ARTERY BIOPSY / LIGATION Bilateral 1997   negative   TRANSURETHRAL RESECTION OF  PROSTATE  2010    Current Medications: Current Meds  Medication Sig   Alirocumab (PRALUENT) 75 MG/ML SOAJ INJECT 75 MLS INTO THE SKIN EVERY 14 (FOURTEEN) DAYS.   cetirizine (ZYRTEC) 10 MG tablet Take 1 tablet (10 mg total) by mouth daily.   empagliflozin (JARDIANCE) 25 MG TABS tablet Take 0.5 tablets by mouth every morning.   ezetimibe (ZETIA) 10 MG tablet Take 10 mg by mouth daily.   fluticasone (FLONASE) 50 MCG/ACT nasal spray Place 2 sprays into both nostrils daily.   fluticasone-salmeterol (WIXELA INHUB) 250-50 MCG/ACT AEPB Inhale 1 puff into the lungs in the morning and at bedtime.   gemfibrozil (LOPID) 600 MG tablet Take 600 mg by mouth 2 (two) times daily before a meal.   losartan (COZAAR) 50 MG tablet Take 1 tablet (50 mg total) by mouth daily.   metFORMIN (GLUCOPHAGE) 500 MG tablet Take 500 mg by mouth 2 (two) times daily with a meal.   metoprolol tartrate (LOPRESSOR) 25 MG tablet Take 1 tablet (25 mg total) by mouth 2 (two) times daily.   Mirabegron (MYRBETRIQ PO) Take 1 tablet by mouth daily.   MOUNJARO 5 MG/0.5ML Pen Inject 0.5 mLs into the skin once a week.   Omega 3 1000 MG CAPS Take 2 capsules (2,000 mg total) by mouth daily.   omeprazole (PRILOSEC) 20 MG capsule Take 20 mg by mouth daily.   rosuvastatin (CRESTOR) 40 MG tablet Take 40 mg by mouth daily.   tadalafil (CIALIS) 10 MG tablet Take 10 mg by mouth daily as needed for erectile dysfunction.   [DISCONTINUED] celecoxib (CELEBREX) 100 MG capsule Take 100 mg by mouth daily.   [DISCONTINUED] trospium (SANCTURA) 20 MG tablet Take 10 mg by mouth 2 (two) times daily.     Allergies:   Atorvastatin, Codeine, Niacin, Piroxicam, Pravastatin, Statins, and Zocor [simvastatin]   Social History   Socioeconomic History   Marital status: Widowed    Spouse name: Not on file   Number of children: Not on file   Years of education: Not on file   Highest education level: Not on file  Occupational History   Not on file  Tobacco  Use   Smoking status: Former    Packs/day: 1.00    Years: 20.00    Total pack years: 20.00    Types: Cigarettes    Quit date: 01/06/1967    Years since quitting: 55.7   Smokeless tobacco: Never  Vaping Use   Vaping Use: Never used  Substance and Sexual Activity   Alcohol use: Yes    Comment: occassionally   Drug use: No   Sexual activity: Not on file  Other Topics Concern   Not on file  Social History Narrative   Not on file   Social Determinants of Health   Financial Resource Strain: Not on file  Food Insecurity: No Food Insecurity (12/29/2021)   Hunger Vital Sign    Worried  About Running Out of Food in the Last Year: Never true    Ran Out of Food in the Last Year: Never true  Transportation Needs: No Transportation Needs (08/16/2022)   PRAPARE - Hydrologist (Medical): No    Lack of Transportation (Non-Medical): No  Physical Activity: Not on file  Stress: Not on file  Social Connections: Not on file     Family History: The patient's family history includes Alcoholism in his father; Brain cancer in his mother; CAD in his brother, brother, and father; Lung cancer in his brother. ROS:   Please see the history of present illness.    All 14 point review of systems negative except as described per history of present illness  EKGs/Labs/Other Studies Reviewed:      Recent Labs: 09/09/2021: TSH 3.800 06/02/2022: Magnesium 2.1; NT-Pro BNP 37 07/06/2022: ALT 14; BUN 14; Creatinine, Ser 1.04; Hemoglobin 15.8; Platelets 193; Potassium 4.6; Sodium 142  Recent Lipid Panel    Component Value Date/Time   CHOL 67 (L) 07/06/2022 0842   TRIG 111 07/06/2022 0842   HDL 39 (L) 07/06/2022 0842   CHOLHDL 1.7 07/06/2022 0842   LDLCALC 7 07/06/2022 0842    Physical Exam:    VS:  BP (!) 156/64 (BP Location: Left Arm, Patient Position: Sitting)   Pulse 71   Ht '5\' 11"'$  (1.803 m)   Wt 241 lb 6.4 oz (109.5 kg)   SpO2 93%   BMI 33.67 kg/m     Wt Readings  from Last 3 Encounters:  09/05/22 241 lb 6.4 oz (109.5 kg)  07/26/22 256 lb (116.1 kg)  07/06/22 255 lb (115.7 kg)     GEN:  Well nourished, well developed in no acute distress HEENT: Normal NECK: No JVD; No carotid bruits LYMPHATICS: No lymphadenopathy CARDIAC: RRR, no murmurs, no rubs, no gallops RESPIRATORY:  Clear to auscultation without rales, wheezing or rhonchi  ABDOMEN: Soft, non-tender, non-distended MUSCULOSKELETAL:  No edema; No deformity  SKIN: Warm and dry LOWER EXTREMITIES: no swelling NEUROLOGIC:  Alert and oriented x 3 PSYCHIATRIC:  Normal affect   ASSESSMENT:    1. Ventricular ectopy   2. Essential hypertension, benign   3. Prediabetes    PLAN:    In order of problems listed above:  Ventricle ectopy.  Low long-haul ejection fraction.  He is on beta-blocker trying to suppress extrasystole with difficult to assess effect.  As a part of stratification of his ventricular ectopy I will schedule him to have Lexiscan to make sure he does not have any inducible ischemia. Essential hypertension blood pressure elevated but he tells me it is always elevated when he comes to doctors office at home it is always good. Dyslipidemia I did review his K PN which show me his LDL of 7!  With HDL of 39. Prediabetes that being followed by internal medicine team.  I will ask him to discontinue his Zetia because of low LDL   Medication Adjustments/Labs and Tests Ordered: Current medicines are reviewed at length with the patient today.  Concerns regarding medicines are outlined above.  No orders of the defined types were placed in this encounter.  Medication changes: No orders of the defined types were placed in this encounter.   Signed, Park Liter, MD, [facility] 09/05/2022 9:45 AM    [place]

## 2022-09-05 NOTE — Addendum Note (Signed)
Addended by: Jacobo Forest D on: 09/05/2022 09:56 AM   Modules accepted: Orders

## 2022-09-05 NOTE — Patient Instructions (Signed)
Medication Instructions:   STOP: Zetia   Lab Work: None Ordered If you have labs (blood work) drawn today and your tests are completely normal, you will receive your results only by: [place] (if you have MyChart) OR A paper copy in the mail If you have any lab test that is abnormal or we need to change your treatment, we will call you to review the results.   Testing/Procedures: Your physician has requested that you have a lexiscan myoview. For further information please visit [email or web address]. Please follow instruction sheet, as given.  The test will take approximately 3 to 4 hours to complete; you may bring reading material.  If someone comes with you to your appointment, they will need to remain in the main lobby due to limited space in the testing area.  How to prepare for your Myocardial Perfusion Test: Do not eat or drink 3 hours prior to your test, except you may have water. Do not consume products containing caffeine (regular or decaffeinated) 12 hours prior to your test. (ex: coffee, chocolate, sodas, tea). Do bring a list of your current medications with you.  If not listed below, you may take your medications as normal. Do wear comfortable clothes (no dresses or overalls) and walking shoes, tennis shoes preferred (No heels or open toe shoes are allowed). Do NOT wear cologne, perfume, aftershave, or lotions (deodorant is allowed). If these instructions are not followed, your test will have to be rescheduled.     Follow-Up: At [facility], you and your health needs are our priority.  As part of our continuing mission to provide you with exceptional heart care, we have created designated Provider Care Teams.  These Care Teams include your primary Cardiologist (physician) and Advanced Practice Providers (APPs -  Physician Assistants and Nurse Practitioners) who all work together to provide you with the care you need, when you need it.  We recommend signing up for the  patient portal called "MyChart".  Sign up information is provided on this After Visit Summary.  MyChart is used to connect with patients for Virtual Visits (Telemedicine).  Patients are able to view lab/test results, encounter notes, upcoming appointments, etc.  Non-urgent messages can be sent to your provider as well.   To learn more about what you can do with MyChart, go to [email or web address].    Your next appointment:   5 month(s)  The format for your next appointment:   In Person  Provider:   Jenne Campus, MD    Other Instructions NA

## 2022-09-05 NOTE — Telephone Encounter (Signed)
Patient given detailed instructions per Myocardial Perfusion Study Information Sheet for the test on 09/06/22 Patient notified to arrive 15 minutes early and that it is imperative to arrive on time for appointment to keep from having the test rescheduled.  If you need to cancel or reschedule your appointment, please call the office within 24 hours of your appointment. . Patient verbalized understanding.Kirstie Peri

## 2022-09-06 ENCOUNTER — Ambulatory Visit: Payer: Medicare HMO | Attending: Cardiology

## 2022-09-06 DIAGNOSIS — I493 Ventricular premature depolarization: Secondary | ICD-10-CM | POA: Diagnosis not present

## 2022-09-06 DIAGNOSIS — R002 Palpitations: Secondary | ICD-10-CM | POA: Diagnosis not present

## 2022-09-06 LAB — MYOCARDIAL PERFUSION IMAGING
LV dias vol: 147 mL (ref 62–150)
LV sys vol: 62 mL
Nuc Stress EF: 58 %
Peak HR: 94 {beats}/min
Rest HR: 71 {beats}/min
Rest Nuclear Isotope Dose: 10.7 mCi
SDS: 5
SRS: 9
SSS: 14
Stress Nuclear Isotope Dose: 30.4 mCi
TID: 1

## 2022-09-06 MED ORDER — TECHNETIUM TC 99M TETROFOSMIN IV KIT
10.7000 | PACK | Freq: Once | INTRAVENOUS | Status: AC | PRN
  Administered 2022-09-06: 10.7 via INTRAVENOUS

## 2022-09-06 MED ORDER — REGADENOSON 0.4 MG/5ML IV SOLN
0.4000 mg | Freq: Once | INTRAVENOUS | Status: AC
  Administered 2022-09-06: 0.4 mg via INTRAVENOUS

## 2022-09-06 MED ORDER — TECHNETIUM TC 99M TETROFOSMIN IV KIT
30.4000 | PACK | Freq: Once | INTRAVENOUS | Status: AC | PRN
  Administered 2022-09-06: 30.4 via INTRAVENOUS

## 2022-09-12 DIAGNOSIS — M9902 Segmental and somatic dysfunction of thoracic region: Secondary | ICD-10-CM | POA: Diagnosis not present

## 2022-09-12 DIAGNOSIS — M50322 Other cervical disc degeneration at C5-C6 level: Secondary | ICD-10-CM | POA: Diagnosis not present

## 2022-09-12 DIAGNOSIS — M50323 Other cervical disc degeneration at C6-C7 level: Secondary | ICD-10-CM | POA: Diagnosis not present

## 2022-09-12 DIAGNOSIS — M50321 Other cervical disc degeneration at C4-C5 level: Secondary | ICD-10-CM | POA: Diagnosis not present

## 2022-09-12 DIAGNOSIS — M62830 Muscle spasm of back: Secondary | ICD-10-CM | POA: Diagnosis not present

## 2022-09-12 DIAGNOSIS — M5413 Radiculopathy, cervicothoracic region: Secondary | ICD-10-CM | POA: Diagnosis not present

## 2022-09-12 DIAGNOSIS — M5031 Other cervical disc degeneration,  high cervical region: Secondary | ICD-10-CM | POA: Diagnosis not present

## 2022-09-12 DIAGNOSIS — M9901 Segmental and somatic dysfunction of cervical region: Secondary | ICD-10-CM | POA: Diagnosis not present

## 2022-09-12 NOTE — Addendum Note (Signed)
Addended by: Jenne Campus on: 09/12/2022 04:40 PM   Modules accepted: Orders

## 2022-09-13 DIAGNOSIS — M50322 Other cervical disc degeneration at C5-C6 level: Secondary | ICD-10-CM | POA: Diagnosis not present

## 2022-09-13 DIAGNOSIS — M9902 Segmental and somatic dysfunction of thoracic region: Secondary | ICD-10-CM | POA: Diagnosis not present

## 2022-09-13 DIAGNOSIS — M9901 Segmental and somatic dysfunction of cervical region: Secondary | ICD-10-CM | POA: Diagnosis not present

## 2022-09-13 DIAGNOSIS — M50323 Other cervical disc degeneration at C6-C7 level: Secondary | ICD-10-CM | POA: Diagnosis not present

## 2022-09-13 DIAGNOSIS — M62830 Muscle spasm of back: Secondary | ICD-10-CM | POA: Diagnosis not present

## 2022-09-13 DIAGNOSIS — M5031 Other cervical disc degeneration,  high cervical region: Secondary | ICD-10-CM | POA: Diagnosis not present

## 2022-09-13 DIAGNOSIS — M50321 Other cervical disc degeneration at C4-C5 level: Secondary | ICD-10-CM | POA: Diagnosis not present

## 2022-09-13 DIAGNOSIS — M5413 Radiculopathy, cervicothoracic region: Secondary | ICD-10-CM | POA: Diagnosis not present

## 2022-09-14 DIAGNOSIS — M9901 Segmental and somatic dysfunction of cervical region: Secondary | ICD-10-CM | POA: Diagnosis not present

## 2022-09-14 DIAGNOSIS — M9902 Segmental and somatic dysfunction of thoracic region: Secondary | ICD-10-CM | POA: Diagnosis not present

## 2022-09-14 DIAGNOSIS — M5413 Radiculopathy, cervicothoracic region: Secondary | ICD-10-CM | POA: Diagnosis not present

## 2022-09-14 DIAGNOSIS — M62830 Muscle spasm of back: Secondary | ICD-10-CM | POA: Diagnosis not present

## 2022-09-14 DIAGNOSIS — M50323 Other cervical disc degeneration at C6-C7 level: Secondary | ICD-10-CM | POA: Diagnosis not present

## 2022-09-14 DIAGNOSIS — M5031 Other cervical disc degeneration,  high cervical region: Secondary | ICD-10-CM | POA: Diagnosis not present

## 2022-09-14 DIAGNOSIS — M50321 Other cervical disc degeneration at C4-C5 level: Secondary | ICD-10-CM | POA: Diagnosis not present

## 2022-09-14 DIAGNOSIS — M50322 Other cervical disc degeneration at C5-C6 level: Secondary | ICD-10-CM | POA: Diagnosis not present

## 2022-09-19 DIAGNOSIS — M62830 Muscle spasm of back: Secondary | ICD-10-CM | POA: Diagnosis not present

## 2022-09-19 DIAGNOSIS — M50321 Other cervical disc degeneration at C4-C5 level: Secondary | ICD-10-CM | POA: Diagnosis not present

## 2022-09-19 DIAGNOSIS — M5413 Radiculopathy, cervicothoracic region: Secondary | ICD-10-CM | POA: Diagnosis not present

## 2022-09-19 DIAGNOSIS — M5031 Other cervical disc degeneration,  high cervical region: Secondary | ICD-10-CM | POA: Diagnosis not present

## 2022-09-19 DIAGNOSIS — M9902 Segmental and somatic dysfunction of thoracic region: Secondary | ICD-10-CM | POA: Diagnosis not present

## 2022-09-19 DIAGNOSIS — M9901 Segmental and somatic dysfunction of cervical region: Secondary | ICD-10-CM | POA: Diagnosis not present

## 2022-09-19 DIAGNOSIS — M50322 Other cervical disc degeneration at C5-C6 level: Secondary | ICD-10-CM | POA: Diagnosis not present

## 2022-09-19 DIAGNOSIS — M50323 Other cervical disc degeneration at C6-C7 level: Secondary | ICD-10-CM | POA: Diagnosis not present

## 2022-09-20 DIAGNOSIS — M9901 Segmental and somatic dysfunction of cervical region: Secondary | ICD-10-CM | POA: Diagnosis not present

## 2022-09-20 DIAGNOSIS — M62830 Muscle spasm of back: Secondary | ICD-10-CM | POA: Diagnosis not present

## 2022-09-20 DIAGNOSIS — M5413 Radiculopathy, cervicothoracic region: Secondary | ICD-10-CM | POA: Diagnosis not present

## 2022-09-20 DIAGNOSIS — M9902 Segmental and somatic dysfunction of thoracic region: Secondary | ICD-10-CM | POA: Diagnosis not present

## 2022-09-20 DIAGNOSIS — M5031 Other cervical disc degeneration,  high cervical region: Secondary | ICD-10-CM | POA: Diagnosis not present

## 2022-09-20 DIAGNOSIS — M50321 Other cervical disc degeneration at C4-C5 level: Secondary | ICD-10-CM | POA: Diagnosis not present

## 2022-09-20 DIAGNOSIS — M50323 Other cervical disc degeneration at C6-C7 level: Secondary | ICD-10-CM | POA: Diagnosis not present

## 2022-09-20 DIAGNOSIS — M50322 Other cervical disc degeneration at C5-C6 level: Secondary | ICD-10-CM | POA: Diagnosis not present

## 2022-09-21 DIAGNOSIS — M9901 Segmental and somatic dysfunction of cervical region: Secondary | ICD-10-CM | POA: Diagnosis not present

## 2022-09-21 DIAGNOSIS — M50321 Other cervical disc degeneration at C4-C5 level: Secondary | ICD-10-CM | POA: Diagnosis not present

## 2022-09-21 DIAGNOSIS — M50323 Other cervical disc degeneration at C6-C7 level: Secondary | ICD-10-CM | POA: Diagnosis not present

## 2022-09-21 DIAGNOSIS — M5413 Radiculopathy, cervicothoracic region: Secondary | ICD-10-CM | POA: Diagnosis not present

## 2022-09-21 DIAGNOSIS — M5031 Other cervical disc degeneration,  high cervical region: Secondary | ICD-10-CM | POA: Diagnosis not present

## 2022-09-21 DIAGNOSIS — M9902 Segmental and somatic dysfunction of thoracic region: Secondary | ICD-10-CM | POA: Diagnosis not present

## 2022-09-21 DIAGNOSIS — M50322 Other cervical disc degeneration at C5-C6 level: Secondary | ICD-10-CM | POA: Diagnosis not present

## 2022-09-21 DIAGNOSIS — M62830 Muscle spasm of back: Secondary | ICD-10-CM | POA: Diagnosis not present

## 2022-09-26 DIAGNOSIS — M50322 Other cervical disc degeneration at C5-C6 level: Secondary | ICD-10-CM | POA: Diagnosis not present

## 2022-09-26 DIAGNOSIS — M5031 Other cervical disc degeneration,  high cervical region: Secondary | ICD-10-CM | POA: Diagnosis not present

## 2022-09-26 DIAGNOSIS — M9902 Segmental and somatic dysfunction of thoracic region: Secondary | ICD-10-CM | POA: Diagnosis not present

## 2022-09-26 DIAGNOSIS — M62830 Muscle spasm of back: Secondary | ICD-10-CM | POA: Diagnosis not present

## 2022-09-26 DIAGNOSIS — M50321 Other cervical disc degeneration at C4-C5 level: Secondary | ICD-10-CM | POA: Diagnosis not present

## 2022-09-26 DIAGNOSIS — M50323 Other cervical disc degeneration at C6-C7 level: Secondary | ICD-10-CM | POA: Diagnosis not present

## 2022-09-26 DIAGNOSIS — M5413 Radiculopathy, cervicothoracic region: Secondary | ICD-10-CM | POA: Diagnosis not present

## 2022-09-26 DIAGNOSIS — M9901 Segmental and somatic dysfunction of cervical region: Secondary | ICD-10-CM | POA: Diagnosis not present

## 2022-09-29 DIAGNOSIS — M9902 Segmental and somatic dysfunction of thoracic region: Secondary | ICD-10-CM | POA: Diagnosis not present

## 2022-09-29 DIAGNOSIS — M50323 Other cervical disc degeneration at C6-C7 level: Secondary | ICD-10-CM | POA: Diagnosis not present

## 2022-09-29 DIAGNOSIS — M50322 Other cervical disc degeneration at C5-C6 level: Secondary | ICD-10-CM | POA: Diagnosis not present

## 2022-09-29 DIAGNOSIS — M50321 Other cervical disc degeneration at C4-C5 level: Secondary | ICD-10-CM | POA: Diagnosis not present

## 2022-09-29 DIAGNOSIS — M62830 Muscle spasm of back: Secondary | ICD-10-CM | POA: Diagnosis not present

## 2022-09-29 DIAGNOSIS — M9901 Segmental and somatic dysfunction of cervical region: Secondary | ICD-10-CM | POA: Diagnosis not present

## 2022-09-29 DIAGNOSIS — M5031 Other cervical disc degeneration,  high cervical region: Secondary | ICD-10-CM | POA: Diagnosis not present

## 2022-09-29 DIAGNOSIS — M5413 Radiculopathy, cervicothoracic region: Secondary | ICD-10-CM | POA: Diagnosis not present

## 2022-10-03 DIAGNOSIS — M9901 Segmental and somatic dysfunction of cervical region: Secondary | ICD-10-CM | POA: Diagnosis not present

## 2022-10-03 DIAGNOSIS — M5413 Radiculopathy, cervicothoracic region: Secondary | ICD-10-CM | POA: Diagnosis not present

## 2022-10-03 DIAGNOSIS — M50321 Other cervical disc degeneration at C4-C5 level: Secondary | ICD-10-CM | POA: Diagnosis not present

## 2022-10-03 DIAGNOSIS — M50323 Other cervical disc degeneration at C6-C7 level: Secondary | ICD-10-CM | POA: Diagnosis not present

## 2022-10-03 DIAGNOSIS — M9902 Segmental and somatic dysfunction of thoracic region: Secondary | ICD-10-CM | POA: Diagnosis not present

## 2022-10-03 DIAGNOSIS — M50322 Other cervical disc degeneration at C5-C6 level: Secondary | ICD-10-CM | POA: Diagnosis not present

## 2022-10-03 DIAGNOSIS — M5031 Other cervical disc degeneration,  high cervical region: Secondary | ICD-10-CM | POA: Diagnosis not present

## 2022-10-03 DIAGNOSIS — M62830 Muscle spasm of back: Secondary | ICD-10-CM | POA: Diagnosis not present

## 2022-10-04 DIAGNOSIS — M50323 Other cervical disc degeneration at C6-C7 level: Secondary | ICD-10-CM | POA: Diagnosis not present

## 2022-10-04 DIAGNOSIS — M9902 Segmental and somatic dysfunction of thoracic region: Secondary | ICD-10-CM | POA: Diagnosis not present

## 2022-10-04 DIAGNOSIS — M50322 Other cervical disc degeneration at C5-C6 level: Secondary | ICD-10-CM | POA: Diagnosis not present

## 2022-10-04 DIAGNOSIS — M5031 Other cervical disc degeneration,  high cervical region: Secondary | ICD-10-CM | POA: Diagnosis not present

## 2022-10-04 DIAGNOSIS — M9901 Segmental and somatic dysfunction of cervical region: Secondary | ICD-10-CM | POA: Diagnosis not present

## 2022-10-04 DIAGNOSIS — M62830 Muscle spasm of back: Secondary | ICD-10-CM | POA: Diagnosis not present

## 2022-10-04 DIAGNOSIS — M5413 Radiculopathy, cervicothoracic region: Secondary | ICD-10-CM | POA: Diagnosis not present

## 2022-10-04 DIAGNOSIS — M50321 Other cervical disc degeneration at C4-C5 level: Secondary | ICD-10-CM | POA: Diagnosis not present

## 2022-10-05 ENCOUNTER — Ambulatory Visit (INDEPENDENT_AMBULATORY_CARE_PROVIDER_SITE_OTHER): Payer: Medicare HMO | Admitting: Family Medicine

## 2022-10-05 VITALS — BP 110/68 | HR 88 | Temp 97.1°F | Resp 14 | Ht 71.0 in | Wt 236.0 lb

## 2022-10-05 DIAGNOSIS — M10072 Idiopathic gout, left ankle and foot: Secondary | ICD-10-CM | POA: Diagnosis not present

## 2022-10-05 HISTORY — DX: Idiopathic gout, left ankle and foot: M10.072

## 2022-10-05 MED ORDER — COLCHICINE 0.6 MG PO TABS: 0.6000 mg | ORAL_TABLET | Freq: Two times a day (BID) | ORAL | 0 refills | Status: DC

## 2022-10-05 NOTE — Assessment & Plan Note (Addendum)
Colchicine 0.6 mg twice daily for 7 days.   Tylenol as needed for discomfort. Labs for CBC and uric acid.

## 2022-10-05 NOTE — Patient Instructions (Signed)
Colchicine 0.6 mg twice daily for 7 days.   Tylenol as needed for discomfort. Labs for CBC and uric acid.

## 2022-10-05 NOTE — Progress Notes (Signed)
Acute Office Visit  Subjective:    Patient ID: Christian Wiggans., male    DOB: 03-04-1941, 82 y.o.   MRN: [id number]  Chief Complaint  Patient presents with   Foot Pain    Left     HPI: Patient is in today for redness and swelling left foot which started about 48 hours ago.  His symptoms have worsened over the last 24 hours.    Past Medical History:  Diagnosis Date   Actinic keratosis 10/31/2020   Benign prostatic hyperplasia 10/31/2020   Mar 13, 2014 Entered By: Alycia Rossetti B Comment: s/p TURP 2010   Body mass index (BMI) 33.0-33.9, adult 11/23/2020   BPPV (benign paroxysmal positional vertigo)    Calculus of kidney    Calculus of kidney    Cancer ([place]) 2015   skin cancer removed off left forearm    Cataract extraction status 10/31/2020   Apr 17, 2009 Entered By: Alinda Dooms Comment: bilateral   Class 2 severe obesity due to excess calories with serious comorbidity and body mass index (BMI) of 35.0 to 35.9 in adult ([place]) 06/10/2021   Closed fracture of body of sternum 11/10/2019   Contusion of right thigh [id number]   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/2022   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   Encounter for fitting and adjustment of hearing aid 03/22/2021   Encounter for immunization 06/02/2022   Epidermoid cyst of skin 10/31/2020   Fatigue 11/23/2020   Gastroesophageal reflux disease 10/31/2020   GERD (gastroesophageal reflux disease)    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   Hemorrhage of gastrointestinal tract 10/31/2020   History of basal cell carcinoma (BCC)    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   Joint pain 11/23/2020   Long-term current use of testosterone replacement therapy 11/23/2020   Low libido 11/23/2020   Mixed hyperlipidemia    Myalgia due to statin 12/14/2021   Need for immunization against influenza 06/10/2021   Osteoarthritis    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   Pain in limb 09/01/2022   Polyp of colon 11/01/2020   Aug 31, 2015 Entered By: Alycia Rossetti B Comment: colonoscopy 05/2015 - needs repeat ASAP   Prediabetes    SDH (subdural hematoma) ([place]) 02/26/2020   Shortness of breath 09/01/2022   Sleep disturbance 11/23/2020   Status post craniotomy 02/10/2020   Stiffness of unspecified hand, not elsewhere classified 06/02/2022   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   Urge incontinence 11/23/2020   Urge incontinence of urine 11/23/2020   Vitamin D deficiency     Past Surgical History:  Procedure Laterality Date   CRANIOTOMY Left 02/10/2020   Procedure: CRANIOTOMY HEMATOMA EVACUATION SUBDURAL;  Surgeon: Earnie Larsson, MD;  Location: [place];  Service: Neurosurgery;  Laterality: Left;   EYE SURGERY Bilateral 2012   cataracts w lens implants   PROSTATECTOMY  2008   BPH   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   TEMPORAL ARTERY BIOPSY / LIGATION Bilateral 1997   negative   TRANSURETHRAL RESECTION OF PROSTATE  2010    Family History  Problem Relation Age of Onset   Brain cancer Mother    CAD Father    Alcoholism Father    CAD Brother    Lung cancer Brother    CAD Brother     Social History   Socioeconomic History   Marital status: Widowed    Spouse name: Not on file   Number of children: Not on file   Years  of education: Not on file   Highest education level: Not on file  Occupational History   Not on file  Tobacco Use   Smoking status: Former    Packs/day: 1.00    Years: 20.00    Total pack years: 20.00    Types: Cigarettes    Quit date: 01/06/1967    Years since quitting: 55.7   Smokeless tobacco: Never  Vaping Use   Vaping Use: Never used  Substance and Sexual Activity   Alcohol use: Yes    Comment: occassionally   Drug use: No   Sexual activity: Not on file  Other Topics Concern   Not on file  Social History Narrative   Not on file   Social Determinants of Health   Financial Resource Strain: Not on file  Food Insecurity: No Food  Insecurity (12/29/2021)   Hunger Vital Sign    Worried About Running Out of Food in the Last Year: Never true    Ran Out of Food in the Last Year: Never true  Transportation Needs: No Transportation Needs (08/16/2022)   PRAPARE - Hydrologist (Medical): No    Lack of Transportation (Non-Medical): No  Physical Activity: Not on file  Stress: Not on file  Social Connections: Not on file  Intimate Partner Violence: Not At Risk (12/29/2021)   Humiliation, Afraid, Rape, and Kick questionnaire    Fear of Current or Ex-Partner: No    Emotionally Abused: No    Physically Abused: No    Sexually Abused: No    Outpatient Medications Prior to Visit  Medication Sig Dispense Refill   Alirocumab (PRALUENT) 75 MG/ML SOAJ INJECT 75 MLS INTO THE SKIN EVERY 14 (FOURTEEN) DAYS. 6 mL 0   cetirizine (ZYRTEC) 10 MG tablet Take 1 tablet (10 mg total) by mouth daily. 100 tablet 0   fluticasone (FLONASE) 50 MCG/ACT nasal spray Place 2 sprays into both nostrils daily. 16 g 6   fluticasone-salmeterol (WIXELA INHUB) 250-50 MCG/ACT AEPB Inhale 1 puff into the lungs in the morning and at bedtime. 3 each 1   gemfibrozil (LOPID) 600 MG tablet Take 600 mg by mouth 2 (two) times daily before a meal.     losartan (COZAAR) 50 MG tablet Take 1 tablet (50 mg total) by mouth daily. 90 tablet 1   metFORMIN (GLUCOPHAGE) 500 MG tablet Take 500 mg by mouth 2 (two) times daily with a meal.     metoprolol tartrate (LOPRESSOR) 25 MG tablet Take 1 tablet (25 mg total) by mouth 2 (two) times daily. 60 tablet 2   Mirabegron (MYRBETRIQ PO) Take 1 tablet by mouth daily.     MOUNJARO 5 MG/0.5ML Pen Inject 0.5 mLs into the skin once a week.     Omega 3 1000 MG CAPS Take 2 capsules (2,000 mg total) by mouth daily. 180 capsule 1   omeprazole (PRILOSEC) 20 MG capsule Take 20 mg by mouth daily.     rosuvastatin (CRESTOR) 40 MG tablet Take 40 mg by mouth daily.     tadalafil (CIALIS) 10 MG tablet Take 10 mg by mouth  daily as needed for erectile dysfunction.     empagliflozin (JARDIANCE) 25 MG TABS tablet Take 0.5 tablets by mouth every morning. (Patient not taking: Reported on 10/05/2022)     No facility-administered medications prior to visit.    Allergies  Allergen Reactions   Atorvastatin     Other reaction(s): Muscle pain   Codeine Other (See Comments)  Niacin Itching    Other reaction(s): Itching, Flushing   Piroxicam     Other reaction(s): Gastric ulcer with hemorrhage   Pravastatin     Other reaction(s): Muscle pain   Statins     myalgias   Zocor [Simvastatin]     Other reaction(s): Muscle pain    Review of Systems  Constitutional:  Negative for chills and fever.  HENT:  Negative for congestion, rhinorrhea and sore throat.   Respiratory:  Negative for cough and shortness of breath.   Cardiovascular:  Negative for chest pain and palpitations.  Gastrointestinal:  Negative for abdominal pain, constipation, diarrhea, nausea and vomiting.  Genitourinary:  Positive for urgency. Negative for dysuria.  Musculoskeletal:  Positive for arthralgias (left foot pain, redness and swelling). Negative for back pain and myalgias.  Neurological:  Negative for dizziness and headaches.  Psychiatric/Behavioral:  Negative for dysphoric mood. The patient is not nervous/anxious.        Objective:        10/05/2022    2:58 PM 09/06/2022    7:45 AM 09/05/2022    9:30 AM  Vitals with BMI  Height '5\' 11"'$  '5\' 11"'$  '5\' 11"'$   Weight 236 lbs 241 lbs 241 lbs 6 oz  BMI 32.93 0000000 [id number]  Systolic A999333  A999333  Diastolic 68  64  Pulse 88  71    No data found.   Physical Exam Vitals reviewed.  Constitutional:      Appearance: Normal appearance.  Musculoskeletal:        General: Swelling (left great toe. erythema.) present.  Neurological:     Mental Status: He is alert.     Health Maintenance Due  Topic Date Due   OPHTHALMOLOGY EXAM  Never done   Diabetic kidney evaluation - Urine ACR  Never done    COVID-19 Vaccine (5 - 2023-24 season) 04/07/2022    There are no preventive care reminders to display for this patient.   Lab Results  Component Value Date   TSH 3.800 09/09/2021   Lab Results  Component Value Date   WBC 7.9 10/05/2022   HGB 16.6 10/05/2022   HCT 50.3 10/05/2022   MCV 85 10/05/2022   PLT 211 10/05/2022   Lab Results  Component Value Date   NA 142 07/06/2022   K 4.6 07/06/2022   CO2 20 07/06/2022   GLUCOSE 151 (H) 07/06/2022   BUN 14 07/06/2022   CREATININE 1.04 07/06/2022   BILITOT 0.8 07/06/2022   ALKPHOS 53 07/06/2022   AST 18 07/06/2022   ALT 14 07/06/2022   PROT 6.9 07/06/2022   ALBUMIN 4.3 07/06/2022   CALCIUM 9.6 07/06/2022   ANIONGAP 11 02/11/2020   EGFR 72 07/06/2022   Lab Results  Component Value Date   CHOL 67 (L) 07/06/2022   Lab Results  Component Value Date   HDL 39 (L) 07/06/2022   Lab Results  Component Value Date   LDLCALC 7 07/06/2022   Lab Results  Component Value Date   TRIG 111 07/06/2022   Lab Results  Component Value Date   CHOLHDL 1.7 07/06/2022   Lab Results  Component Value Date   HGBA1C 6.7 (H) 07/06/2022       Assessment & Plan:  Acute idiopathic gout involving toe of left foot Assessment & Plan: Colchicine 0.6 mg twice daily for 7 days.   Tylenol as needed for discomfort. Labs for CBC and uric acid.  Orders: -     Uric acid -  CBC with Differential/Platelet  Other orders -     Colchicine; Take 1 tablet (0.6 mg total) by mouth 2 (two) times daily.  Dispense: 14 tablet; Refill: 0     Meds ordered this encounter  Medications   colchicine 0.6 MG tablet    Sig: Take 1 tablet (0.6 mg total) by mouth 2 (two) times daily.    Dispense:  14 tablet    Refill:  0    Orders Placed This Encounter  Procedures   Uric Acid   CBC with Differential/Platelet     Follow-up: Return if symptoms worsen or fail to improve.  An After Visit Summary was printed and given to the patient.  Rochel Brome,  MD [facility] [phone]

## 2022-10-06 DIAGNOSIS — M62830 Muscle spasm of back: Secondary | ICD-10-CM | POA: Diagnosis not present

## 2022-10-06 DIAGNOSIS — M50321 Other cervical disc degeneration at C4-C5 level: Secondary | ICD-10-CM | POA: Diagnosis not present

## 2022-10-06 DIAGNOSIS — M50322 Other cervical disc degeneration at C5-C6 level: Secondary | ICD-10-CM | POA: Diagnosis not present

## 2022-10-06 DIAGNOSIS — M9901 Segmental and somatic dysfunction of cervical region: Secondary | ICD-10-CM | POA: Diagnosis not present

## 2022-10-06 DIAGNOSIS — M5413 Radiculopathy, cervicothoracic region: Secondary | ICD-10-CM | POA: Diagnosis not present

## 2022-10-06 DIAGNOSIS — M5031 Other cervical disc degeneration,  high cervical region: Secondary | ICD-10-CM | POA: Diagnosis not present

## 2022-10-06 DIAGNOSIS — M50323 Other cervical disc degeneration at C6-C7 level: Secondary | ICD-10-CM | POA: Diagnosis not present

## 2022-10-06 DIAGNOSIS — M9902 Segmental and somatic dysfunction of thoracic region: Secondary | ICD-10-CM | POA: Diagnosis not present

## 2022-10-06 LAB — CBC WITH DIFFERENTIAL/PLATELET
Basophils Absolute: 0.1 10*3/uL (ref 0.0–0.2)
Basos: 1 %
EOS (ABSOLUTE): 0.2 10*3/uL (ref 0.0–0.4)
Eos: 3 %
Hematocrit: 50.3 % (ref 37.5–51.0)
Hemoglobin: 16.6 g/dL (ref 13.0–17.7)
Immature Grans (Abs): 0 10*3/uL (ref 0.0–0.1)
Immature Granulocytes: 0 %
Lymphocytes Absolute: 1.3 10*3/uL (ref 0.7–3.1)
Lymphs: 17 %
MCH: 28.1 pg (ref 26.6–33.0)
MCHC: 33 g/dL (ref 31.5–35.7)
MCV: 85 fL (ref 79–97)
Monocytes Absolute: 0.7 10*3/uL (ref 0.1–0.9)
Monocytes: 9 %
Neutrophils Absolute: 5.5 10*3/uL (ref 1.4–7.0)
Neutrophils: 70 %
Platelets: 211 10*3/uL (ref 150–450)
RBC: 5.9 x10E6/uL — ABNORMAL HIGH (ref 4.14–5.80)
RDW: 14.9 % (ref 11.6–15.4)
WBC: 7.9 10*3/uL (ref 3.4–10.8)

## 2022-10-06 LAB — URIC ACID: Uric Acid: 4.9 mg/dL (ref 3.8–8.4)

## 2022-10-08 ENCOUNTER — Encounter: Payer: Self-pay | Admitting: Family Medicine

## 2022-10-09 NOTE — Assessment & Plan Note (Signed)
Well controlled.  No changes to medicines. Losartan '50mg'$  once daily.  Continue to work on eating a healthy diet and exercise.  Labs drawn today.

## 2022-10-09 NOTE — Assessment & Plan Note (Addendum)
Continue praluent, rosuvastatin.

## 2022-10-09 NOTE — Assessment & Plan Note (Addendum)
Hemoglobin A1c 6.7%, 3 month avg of blood sugars, is in prediabetic range.  In order to prevent progression to diabetes, recommend low carb diet and regular exercise

## 2022-10-09 NOTE — Assessment & Plan Note (Signed)
The current medical regimen is effective;  continue present plan and medications.  [email or web address] puff twice daily

## 2022-10-09 NOTE — Assessment & Plan Note (Signed)
The current medical regimen is effective;  continue present plan and medications. omeprazole 20 mg daily.

## 2022-10-09 NOTE — Progress Notes (Signed)
Subjective:  Patient ID: Christian Lowe., male    DOB: [birth year]  Age: 82 y.o. MRN: [id number]  Chief Complaint  Patient presents with   Hyperlipidemia   Hypertension    HPI Diabetes:  Not checking sugar. Eating better. Metformin 500 mg 2 times day, Mounjaro  5 mg week. Eye exam annually.   Hyperlipidemia: Current medications: Tolerating crestor 40 mg daily. On Fish oil 100 mg one twice a day.  Praluent injection 75 mg/ml once every 2 weeks. Gemfibrozil 600 mg twice a day.  Hypertension: Current medications: Losartan '50mg'$  once daily.   COPD/Asthma: Current medications: on wixela 1 puff twice daily. No dyspnea. [facility].  Does not use albuterol.  Aortic Atherosclerosis: Current medications: Praluent injection 75 mg/ml once every two weeks, rosuvastatin  40 mg daily.  GERD: on omeprazole 20 mg daily.   BPH: Trospium 20 mg once daily and myrbetriq daily     10/05/2022    3:05 PM 12/29/2021    2:47 PM 12/14/2021    8:23 AM 09/09/2021    8:13 AM 07/08/2021   11:25 AM  Depression screen PHQ 2/9  Decreased Interest 0 0 0 0 0  Down, Depressed, Hopeless 0 0 0 0 0  PHQ - 2 Score 0 0 0 0 0         09/09/2021    8:13 AM 12/14/2021    8:23 AM 12/29/2021    2:48 PM 07/06/2022    8:00 AM 10/05/2022    3:05 PM  [place] in the past year?  0 0 0 0  Was there an injury with Fall? 0 0 0 0 0  Fall Risk Category Calculator  0 0 0 0  Fall Risk Category (Retired)  Low Low Low   (RETIRED) Patient Fall Risk Level   Low fall risk Low fall risk   Patient at Risk for Falls Due to   No Fall Risks No Fall Risks History of fall(s)  Fall risk Follow up Falls evaluation completed Falls evaluation completed Falls evaluation completed;Education provided Falls evaluation completed Falls evaluation completed;Falls prevention discussed      Review of Systems  Constitutional:  Negative for chills, diaphoresis, fatigue and fever.  HENT:  Negative for congestion, ear pain and sore  throat.   Respiratory:  Negative for cough and shortness of breath.   Cardiovascular:  Negative for chest pain and leg swelling.  Gastrointestinal:  Negative for abdominal pain, constipation, diarrhea, nausea and vomiting.  Genitourinary:  Negative for dysuria and urgency.  Musculoskeletal:  Negative for arthralgias and myalgias.       Left big toe pain  Neurological:  Negative for dizziness and headaches.  Psychiatric/Behavioral:  Negative for dysphoric mood.     Current Outpatient Medications on File Prior to Visit  Medication Sig Dispense Refill   Alirocumab (PRALUENT) 75 MG/ML SOAJ INJECT 75 MLS INTO THE SKIN EVERY 14 (FOURTEEN) DAYS. 6 mL 0   cetirizine (ZYRTEC) 10 MG tablet Take 1 tablet (10 mg total) by mouth daily. 100 tablet 0   colchicine 0.6 MG tablet Take 1 tablet (0.6 mg total) by mouth 2 (two) times daily. 14 tablet 0   fluticasone (FLONASE) 50 MCG/ACT nasal spray Place 2 sprays into both nostrils daily. 16 g 6   fluticasone-salmeterol (WIXELA INHUB) 250-50 MCG/ACT AEPB Inhale 1 puff into the lungs in the morning and at bedtime. 3 each 1   gemfibrozil (LOPID) 600 MG tablet Take 600 mg by mouth 2 (  two) times daily before a meal.     metFORMIN (GLUCOPHAGE) 500 MG tablet Take 500 mg by mouth 2 (two) times daily with a meal.     metoprolol tartrate (LOPRESSOR) 25 MG tablet Take 1 tablet (25 mg total) by mouth 2 (two) times daily. 60 tablet 2   Mirabegron (MYRBETRIQ PO) Take 1 tablet by mouth daily.     MOUNJARO 5 MG/0.5ML Pen Inject 0.5 mLs into the skin once a week.     Omega 3 1000 MG CAPS Take 2 capsules (2,000 mg total) by mouth daily. 180 capsule 1   omeprazole (PRILOSEC) 20 MG capsule Take 20 mg by mouth daily.     rosuvastatin (CRESTOR) 40 MG tablet Take 40 mg by mouth daily.     tadalafil (CIALIS) 10 MG tablet Take 10 mg by mouth daily as needed for erectile dysfunction.     No current facility-administered medications on file prior to visit.   Past Medical History:   Diagnosis Date   Actinic keratosis 10/31/2020   Benign prostatic hyperplasia 10/31/2020   Mar 13, 2014 Entered By: Alycia Rossetti B Comment: s/p TURP 2010   Body mass index (BMI) 33.0-33.9, adult 11/23/2020   BPPV (benign paroxysmal positional vertigo)    Calculus of kidney    Calculus of kidney    Cancer ([place]) 2015   skin cancer removed off left forearm    Cataract extraction status 10/31/2020   Apr 17, 2009 Entered By: Alinda Dooms Comment: bilateral   Class 2 severe obesity due to excess calories with serious comorbidity and body mass index (BMI) of 35.0 to 35.9 in adult ([place]) 06/10/2021   Closed fracture of body of sternum 11/10/2019   Contusion of right thigh [id number]   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/2022   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   Encounter for fitting and adjustment of hearing aid 03/22/2021   Encounter for immunization 06/02/2022   Epidermoid cyst of skin 10/31/2020   Fatigue 11/23/2020   Gastroesophageal reflux disease 10/31/2020   GERD (gastroesophageal reflux disease)    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   Hemorrhage of gastrointestinal tract 10/31/2020   History of basal cell carcinoma (BCC)    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   Joint pain 11/23/2020   Long-term current use of testosterone replacement therapy 11/23/2020   Low libido 11/23/2020   Mixed hyperlipidemia    Myalgia due to statin 12/14/2021   Need for immunization against influenza 06/10/2021   Osteoarthritis    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   Pain in limb 09/01/2022   Polyp of colon 11/01/2020   Aug 31, 2015 Entered By: Alycia Rossetti B Comment: colonoscopy 05/2015 - needs repeat ASAP   Prediabetes    SDH (subdural hematoma) ([place]) 02/26/2020   Shortness of breath 09/01/2022   Sleep disturbance 11/23/2020   Status post craniotomy 02/10/2020   Stiffness of unspecified hand, not elsewhere classified 06/02/2022   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   Urge incontinence 11/23/2020   Urge incontinence of urine 11/23/2020   Vitamin D deficiency    Past Surgical History:  Procedure Laterality Date   CRANIOTOMY Left 02/10/2020   Procedure: CRANIOTOMY HEMATOMA EVACUATION SUBDURAL;  Surgeon: Earnie Larsson, MD;  Location: [facility];  Service: Neurosurgery;  Laterality: Left;   EYE SURGERY Bilateral 2012   cataracts w lens implants   PROSTATECTOMY  2008   BPH   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   TEMPORAL ARTERY BIOPSY / LIGATION Bilateral  1997   negative   TRANSURETHRAL RESECTION OF PROSTATE  2010    Family History  Problem Relation Age of Onset   Brain cancer Mother    CAD Father    Alcoholism Father    CAD Brother    Lung cancer Brother    CAD Brother    Social History   Socioeconomic History   Marital status: Widowed    Spouse name: Not on file   Number of children: Not on file   Years of education: Not on file   Highest education level: Not on file  Occupational History   Not on file  Tobacco Use   Smoking status: Former    Packs/day: 1.00    Years: 20.00    Total pack years: 20.00    Types: Cigarettes    Quit date: 01/06/1967    Years since quitting: 55.8   Smokeless tobacco: Never  Vaping Use   Vaping Use: Never used  Substance and Sexual Activity   Alcohol use: Yes    Comment: occassionally   Drug use: No   Sexual activity: Not on file  Other Topics Concern   Not on file  Social History Narrative   Not on file   Social Determinants of Health   Financial Resource Strain: Low Risk  (10/10/2022)   Overall Financial Resource Strain (CARDIA)    Difficulty of Paying Living Expenses: Not hard at all  Food Insecurity: No Food Insecurity (12/29/2021)   Hunger Vital Sign    Worried About Running Out of Food in the Last Year: Never true    [place] in the Last Year: Never true  Transportation Needs: No Transportation Needs (08/16/2022)   PRAPARE - Radiographer, therapeutic (Medical): No    Lack of Transportation (Non-Medical): No  Physical Activity: Inactive (10/10/2022)   Exercise Vital Sign    Days of Exercise per Week: 0 days    Minutes of Exercise per Session: 0 min  Stress: No Stress Concern Present (10/10/2022)   [place]    Feeling of Stress : Not at all  Social Connections: Moderately Isolated (10/10/2022)   Social Connection and Isolation Panel [NHANES]    Frequency of Communication with Friends and Family: Twice a week    Frequency of Social Gatherings with Friends and Family: Twice a week    Attends Religious Services: Never    Printmaker: No    Attends Music therapist: Never    Marital Status: Married    Objective:  BP 132/70   Pulse 66   Temp (!) 97.5 F (36.4 C)   Ht '5\' 11"'$  (1.803 m)   Wt 235 lb (106.6 kg)   SpO2 99%   BMI 32.78 kg/m      10/10/2022    7:52 AM 10/05/2022    2:58 PM 09/06/2022    7:45 AM  BP/Weight  Systolic BP [id number]   Diastolic BP 70 68   Wt. (Lbs) 235 236 241  BMI 32.78 kg/m2 32.92 kg/m2 33.61 kg/m2    Physical Exam Vitals reviewed.  Constitutional:      Appearance: Normal appearance. He is obese.  Neck:     Vascular: No carotid bruit.  Cardiovascular:     Rate and Rhythm: Normal rate and regular rhythm.     Pulses: Normal pulses.     Heart sounds: Normal heart sounds.  Pulmonary:  Effort: Pulmonary effort is normal.     Breath sounds: Normal breath sounds. No wheezing, rhonchi or rales.  Abdominal:     General: Bowel sounds are normal.     Palpations: Abdomen is soft.     Tenderness: There is no abdominal tenderness.  Neurological:     Mental Status: He is alert.  Psychiatric:        Mood and Affect: Mood normal.        Behavior: Behavior normal.     Diabetic Foot Exam - Simple   Simple Foot Form Diabetic Foot exam was performed with the following findings: Yes  10/10/2022  8:37 AM  Visual Inspection See comments: Yes Sensation Testing Intact to touch and monofilament testing bilaterally: Yes Pulse Check Posterior Tibialis and Dorsalis pulse intact bilaterally: Yes Comments Bunion left foot.  Calluses on bunion.       Lab Results  Component Value Date   WBC 7.9 10/05/2022   HGB 16.6 10/05/2022   HCT 50.3 10/05/2022   PLT 211 10/05/2022   GLUCOSE 87 10/10/2022   CHOL 74 (L) 10/10/2022   TRIG 112 10/10/2022   HDL 36 (L) 10/10/2022   LDLCALC 17 10/10/2022   ALT 9 10/10/2022   AST 26 10/10/2022   NA 143 10/10/2022   K CANCELED 10/10/2022   CL 107 (H) 10/10/2022   CREATININE 1.28 (H) 10/10/2022   BUN 14 10/10/2022   CO2 15 (L) 10/10/2022   TSH 3.800 09/09/2021   INR 1.1 02/10/2020   HGBA1C 5.5 10/10/2022      Assessment & Plan:    Aortic atherosclerosis (HCC) Assessment & Plan: Continue praluent, rosuvastatin.    Diabetic nephropathy associated with type 2 diabetes mellitus ([place]) Assessment & Plan: Control: improved. Recommend check sugars fasting daily. Recommend check feet daily. Recommend annual eye exams. Medicines:  Metformin 500 mg 2 times day, Mounjaro  5 mg weekly, Increase losartan to 100 mg qam  Continue to work on eating a healthy diet and exercise.  Labs drawn today.      Orders: -     Lipid panel  Hypertension associated with diabetes [facility]) Assessment & Plan: Well controlled.  No changes to medicines. Continue Losartan '50mg'$  once daily.  'Continue to work on eating a healthy diet and exercise.  Labs drawn today.    Orders: -     Comprehensive metabolic panel -     Hemoglobin A1c -     Microalbumin / creatinine urine ratio -     Cardiovascular Risk Assessment  COPD mixed type [facility]) Assessment & Plan: The current medical regimen is effective;  continue present plan and medications.  wixela1 puff twice daily   GERD without esophagitis Assessment & Plan: The current medical regimen is  effective;  continue present plan and medications. omeprazole 20 mg daily.    Benign prostatic hyperplasia, unspecified whether lower urinary tract symptoms present Assessment & Plan: Continue Trospium 20 mg once daily and Tamsulosin 0.4 mg before bed.     Mixed hyperlipidemia Assessment & Plan: Well controlled.  No changes to medicines. Continue praluent and crestor. Continue to work on eating a healthy diet and exercise.  Labs drawn today.        No orders of the defined types were placed in this encounter.   Orders Placed This Encounter  Procedures   Comprehensive metabolic panel   Hemoglobin A1c   Lipid panel   Microalbumin / creatinine urine ratio   Cardiovascular Risk Assessment  Follow-up: Return in about 3 months (around 01/10/2023) for chronic fasting.   I,Marla I Leal-Borjas,acting as a scribe for Rochel Brome, MD.,have documented all relevant documentation on the behalf of Rochel Brome, MD,as directed by  Rochel Brome, MD while in the presence of Rochel Brome, MD.   An After Visit Summary was printed and given to the patient.  I attest that I have reviewed this visit and agree with the plan scribed by my staff.   Rochel Brome, MD [facility] [phone]

## 2022-10-09 NOTE — Assessment & Plan Note (Addendum)
Well controlled.  No changes to medicines. Continue Losartan '50mg'$  once daily.  'Continue to work on eating a healthy diet and exercise.  Labs drawn today.

## 2022-10-09 NOTE — Assessment & Plan Note (Signed)
Continue Trospium 20 mg once daily and Tamsulosin 0.4 mg before bed.

## 2022-10-10 ENCOUNTER — Ambulatory Visit (INDEPENDENT_AMBULATORY_CARE_PROVIDER_SITE_OTHER): Payer: Medicare HMO | Admitting: Family Medicine

## 2022-10-10 ENCOUNTER — Encounter: Payer: Self-pay | Admitting: Family Medicine

## 2022-10-10 VITALS — BP 132/70 | HR 66 | Temp 97.5°F | Ht 71.0 in | Wt 235.0 lb

## 2022-10-10 DIAGNOSIS — K219 Gastro-esophageal reflux disease without esophagitis: Secondary | ICD-10-CM

## 2022-10-10 DIAGNOSIS — M50323 Other cervical disc degeneration at C6-C7 level: Secondary | ICD-10-CM | POA: Diagnosis not present

## 2022-10-10 DIAGNOSIS — E1159 Type 2 diabetes mellitus with other circulatory complications: Secondary | ICD-10-CM | POA: Diagnosis not present

## 2022-10-10 DIAGNOSIS — I152 Hypertension secondary to endocrine disorders: Secondary | ICD-10-CM | POA: Diagnosis not present

## 2022-10-10 DIAGNOSIS — M9901 Segmental and somatic dysfunction of cervical region: Secondary | ICD-10-CM | POA: Diagnosis not present

## 2022-10-10 DIAGNOSIS — M5031 Other cervical disc degeneration,  high cervical region: Secondary | ICD-10-CM | POA: Diagnosis not present

## 2022-10-10 DIAGNOSIS — E782 Mixed hyperlipidemia: Secondary | ICD-10-CM

## 2022-10-10 DIAGNOSIS — N400 Enlarged prostate without lower urinary tract symptoms: Secondary | ICD-10-CM

## 2022-10-10 DIAGNOSIS — E1121 Type 2 diabetes mellitus with diabetic nephropathy: Secondary | ICD-10-CM | POA: Diagnosis not present

## 2022-10-10 DIAGNOSIS — J449 Chronic obstructive pulmonary disease, unspecified: Secondary | ICD-10-CM | POA: Diagnosis not present

## 2022-10-10 DIAGNOSIS — M5413 Radiculopathy, cervicothoracic region: Secondary | ICD-10-CM | POA: Diagnosis not present

## 2022-10-10 DIAGNOSIS — I700 Atherosclerosis of aorta: Secondary | ICD-10-CM

## 2022-10-10 DIAGNOSIS — E1169 Type 2 diabetes mellitus with other specified complication: Secondary | ICD-10-CM | POA: Diagnosis not present

## 2022-10-10 DIAGNOSIS — R7303 Prediabetes: Secondary | ICD-10-CM | POA: Diagnosis not present

## 2022-10-10 DIAGNOSIS — E785 Hyperlipidemia, unspecified: Secondary | ICD-10-CM | POA: Diagnosis not present

## 2022-10-10 DIAGNOSIS — M50322 Other cervical disc degeneration at C5-C6 level: Secondary | ICD-10-CM | POA: Diagnosis not present

## 2022-10-10 DIAGNOSIS — M9902 Segmental and somatic dysfunction of thoracic region: Secondary | ICD-10-CM | POA: Diagnosis not present

## 2022-10-10 DIAGNOSIS — M50321 Other cervical disc degeneration at C4-C5 level: Secondary | ICD-10-CM | POA: Diagnosis not present

## 2022-10-10 DIAGNOSIS — M62830 Muscle spasm of back: Secondary | ICD-10-CM | POA: Diagnosis not present

## 2022-10-10 DIAGNOSIS — I10 Essential (primary) hypertension: Secondary | ICD-10-CM | POA: Diagnosis not present

## 2022-10-11 LAB — COMPREHENSIVE METABOLIC PANEL
ALT: 9 IU/L (ref 0–44)
AST: 26 IU/L (ref 0–40)
Albumin/Globulin Ratio: 1.7 (ref 1.2–2.2)
Albumin: 4.3 g/dL (ref 3.7–4.7)
Alkaline Phosphatase: 52 IU/L (ref 44–121)
BUN/Creatinine Ratio: 11 (ref 10–24)
BUN: 14 mg/dL (ref 8–27)
Bilirubin Total: 0.6 mg/dL (ref 0.0–1.2)
CO2: 15 mmol/L — ABNORMAL LOW (ref 20–29)
Calcium: 9.5 mg/dL (ref 8.6–10.2)
Chloride: 107 mmol/L — ABNORMAL HIGH (ref 96–106)
Creatinine, Ser: 1.28 mg/dL — ABNORMAL HIGH (ref 0.76–1.27)
Globulin, Total: 2.6 g/dL (ref 1.5–4.5)
Glucose: 87 mg/dL (ref 70–99)
Sodium: 143 mmol/L (ref 134–144)
Total Protein: 6.9 g/dL (ref 6.0–8.5)
eGFR: 56 mL/min/{1.73_m2} — ABNORMAL LOW (ref >59)

## 2022-10-11 LAB — LIPID PANEL
Chol/HDL Ratio: 2.1 ratio (ref 0.0–5.0)
Cholesterol, Total: 74 mg/dL — ABNORMAL LOW (ref 100–199)
HDL: 36 mg/dL — ABNORMAL LOW (ref >39)
LDL Chol Calc (NIH): 17 mg/dL (ref 0–99)
Triglycerides: 112 mg/dL (ref 0–149)
VLDL Cholesterol Cal: 21 mg/dL (ref 5–40)

## 2022-10-11 LAB — MICROALBUMIN / CREATININE URINE RATIO
Creatinine, Urine: 118.6 mg/dL
Microalb/Creat Ratio: 34 mg/g creat — ABNORMAL HIGH (ref 0–29)
Microalbumin, Urine: 40.6 ug/mL

## 2022-10-11 LAB — CARDIOVASCULAR RISK ASSESSMENT

## 2022-10-11 LAB — HEMOGLOBIN A1C
Est. average glucose Bld gHb Est-mCnc: 111 mg/dL
Hgb A1c MFr Bld: 5.5 % (ref 4.8–5.6)

## 2022-10-11 NOTE — Progress Notes (Signed)
Blood count normal.  Liver function normal.  Kidney function abnormal.  Cholesterol: Good cholesterol is a little too low.  Recommend healthy eating and exercise.  Rest of cholesterol panel is well-controlled. HBA1C: Dropped from 6.7 down to 5.5!  Excellent job. Small amount of protein in his urine.  Recommend increase losartan to 100 mg daily.

## 2022-10-13 ENCOUNTER — Other Ambulatory Visit: Payer: Self-pay

## 2022-10-13 DIAGNOSIS — M9901 Segmental and somatic dysfunction of cervical region: Secondary | ICD-10-CM | POA: Diagnosis not present

## 2022-10-13 DIAGNOSIS — M50321 Other cervical disc degeneration at C4-C5 level: Secondary | ICD-10-CM | POA: Diagnosis not present

## 2022-10-13 DIAGNOSIS — M50322 Other cervical disc degeneration at C5-C6 level: Secondary | ICD-10-CM | POA: Diagnosis not present

## 2022-10-13 DIAGNOSIS — M5031 Other cervical disc degeneration,  high cervical region: Secondary | ICD-10-CM | POA: Diagnosis not present

## 2022-10-13 DIAGNOSIS — M9902 Segmental and somatic dysfunction of thoracic region: Secondary | ICD-10-CM | POA: Diagnosis not present

## 2022-10-13 DIAGNOSIS — M62830 Muscle spasm of back: Secondary | ICD-10-CM | POA: Diagnosis not present

## 2022-10-13 DIAGNOSIS — M5413 Radiculopathy, cervicothoracic region: Secondary | ICD-10-CM | POA: Diagnosis not present

## 2022-10-13 DIAGNOSIS — M50323 Other cervical disc degeneration at C6-C7 level: Secondary | ICD-10-CM | POA: Diagnosis not present

## 2022-10-13 MED ORDER — LOSARTAN POTASSIUM 50 MG PO TABS: 100.0000 mg | ORAL_TABLET | Freq: Every day | ORAL | 1 refills | Status: DC

## 2022-10-14 NOTE — Assessment & Plan Note (Addendum)
>>  ASSESSMENT AND PLAN FOR DIABETIC NEPHROPATHY (HCC) WRITTEN ON 10/15/2022 10:39 AM BY COX, KIRSTEN, MD  Control: improved. Recommend check sugars fasting daily. Recommend check feet daily. Recommend annual eye exams. Medicines:  Metformin 500 mg 2 times day, Mounjaro  5 mg weekly, Increase losartan to 100 mg qam  Continue to work on eating a healthy diet and exercise.  Labs drawn today.      >>ASSESSMENT AND PLAN FOR PREDIABETES WRITTEN ON 10/09/2022  8:47 PM BY LEAL-BORJAS, Ritchie Klee I, CMA  Hemoglobin A1c 6.7%, 3 month avg of blood sugars, is in prediabetic range.  In order to prevent progression to diabetes, recommend low carb diet and regular exercise

## 2022-10-15 ENCOUNTER — Other Ambulatory Visit: Payer: Self-pay | Admitting: Family Medicine

## 2022-10-15 DIAGNOSIS — E782 Mixed hyperlipidemia: Secondary | ICD-10-CM | POA: Insufficient documentation

## 2022-10-15 MED ORDER — LOSARTAN POTASSIUM 100 MG PO TABS: 100.0000 mg | ORAL_TABLET | Freq: Every day | ORAL | 1 refills | Status: AC

## 2022-10-15 NOTE — Assessment & Plan Note (Signed)
Well controlled.  No changes to medicines. Continue praluent and crestor. Continue to work on eating a healthy diet and exercise.  Labs drawn today.

## 2022-10-16 ENCOUNTER — Telehealth: Payer: Self-pay

## 2022-10-16 NOTE — Telephone Encounter (Signed)
PA submitted and approved covermymeds for repatha. "Your request has been approved. Authorization Expiration Date: August 07, 2023."

## 2022-10-17 DIAGNOSIS — M50321 Other cervical disc degeneration at C4-C5 level: Secondary | ICD-10-CM | POA: Diagnosis not present

## 2022-10-17 DIAGNOSIS — M9902 Segmental and somatic dysfunction of thoracic region: Secondary | ICD-10-CM | POA: Diagnosis not present

## 2022-10-17 DIAGNOSIS — M50323 Other cervical disc degeneration at C6-C7 level: Secondary | ICD-10-CM | POA: Diagnosis not present

## 2022-10-17 DIAGNOSIS — M62830 Muscle spasm of back: Secondary | ICD-10-CM | POA: Diagnosis not present

## 2022-10-17 DIAGNOSIS — M5031 Other cervical disc degeneration,  high cervical region: Secondary | ICD-10-CM | POA: Diagnosis not present

## 2022-10-17 DIAGNOSIS — M5413 Radiculopathy, cervicothoracic region: Secondary | ICD-10-CM | POA: Diagnosis not present

## 2022-10-17 DIAGNOSIS — M9901 Segmental and somatic dysfunction of cervical region: Secondary | ICD-10-CM | POA: Diagnosis not present

## 2022-10-17 DIAGNOSIS — M50322 Other cervical disc degeneration at C5-C6 level: Secondary | ICD-10-CM | POA: Diagnosis not present

## 2022-10-20 DIAGNOSIS — M9901 Segmental and somatic dysfunction of cervical region: Secondary | ICD-10-CM | POA: Diagnosis not present

## 2022-10-20 DIAGNOSIS — M62830 Muscle spasm of back: Secondary | ICD-10-CM | POA: Diagnosis not present

## 2022-10-20 DIAGNOSIS — M50323 Other cervical disc degeneration at C6-C7 level: Secondary | ICD-10-CM | POA: Diagnosis not present

## 2022-10-20 DIAGNOSIS — M5031 Other cervical disc degeneration,  high cervical region: Secondary | ICD-10-CM | POA: Diagnosis not present

## 2022-10-20 DIAGNOSIS — M50322 Other cervical disc degeneration at C5-C6 level: Secondary | ICD-10-CM | POA: Diagnosis not present

## 2022-10-20 DIAGNOSIS — M50321 Other cervical disc degeneration at C4-C5 level: Secondary | ICD-10-CM | POA: Diagnosis not present

## 2022-10-20 DIAGNOSIS — M5413 Radiculopathy, cervicothoracic region: Secondary | ICD-10-CM | POA: Diagnosis not present

## 2022-10-20 DIAGNOSIS — M9902 Segmental and somatic dysfunction of thoracic region: Secondary | ICD-10-CM | POA: Diagnosis not present

## 2022-10-24 DIAGNOSIS — M9901 Segmental and somatic dysfunction of cervical region: Secondary | ICD-10-CM | POA: Diagnosis not present

## 2022-10-24 DIAGNOSIS — M9902 Segmental and somatic dysfunction of thoracic region: Secondary | ICD-10-CM | POA: Diagnosis not present

## 2022-10-24 DIAGNOSIS — M5031 Other cervical disc degeneration,  high cervical region: Secondary | ICD-10-CM | POA: Diagnosis not present

## 2022-10-24 DIAGNOSIS — M50323 Other cervical disc degeneration at C6-C7 level: Secondary | ICD-10-CM | POA: Diagnosis not present

## 2022-10-24 DIAGNOSIS — M50321 Other cervical disc degeneration at C4-C5 level: Secondary | ICD-10-CM | POA: Diagnosis not present

## 2022-10-24 DIAGNOSIS — M5413 Radiculopathy, cervicothoracic region: Secondary | ICD-10-CM | POA: Diagnosis not present

## 2022-10-24 DIAGNOSIS — M50322 Other cervical disc degeneration at C5-C6 level: Secondary | ICD-10-CM | POA: Diagnosis not present

## 2022-10-24 DIAGNOSIS — M62830 Muscle spasm of back: Secondary | ICD-10-CM | POA: Diagnosis not present

## 2022-10-27 DIAGNOSIS — M62830 Muscle spasm of back: Secondary | ICD-10-CM | POA: Diagnosis not present

## 2022-10-27 DIAGNOSIS — M50321 Other cervical disc degeneration at C4-C5 level: Secondary | ICD-10-CM | POA: Diagnosis not present

## 2022-10-27 DIAGNOSIS — M50323 Other cervical disc degeneration at C6-C7 level: Secondary | ICD-10-CM | POA: Diagnosis not present

## 2022-10-27 DIAGNOSIS — M5413 Radiculopathy, cervicothoracic region: Secondary | ICD-10-CM | POA: Diagnosis not present

## 2022-10-27 DIAGNOSIS — M9902 Segmental and somatic dysfunction of thoracic region: Secondary | ICD-10-CM | POA: Diagnosis not present

## 2022-10-27 DIAGNOSIS — M5031 Other cervical disc degeneration,  high cervical region: Secondary | ICD-10-CM | POA: Diagnosis not present

## 2022-10-27 DIAGNOSIS — M9901 Segmental and somatic dysfunction of cervical region: Secondary | ICD-10-CM | POA: Diagnosis not present

## 2022-10-27 DIAGNOSIS — M50322 Other cervical disc degeneration at C5-C6 level: Secondary | ICD-10-CM | POA: Diagnosis not present

## 2022-10-31 DIAGNOSIS — M9902 Segmental and somatic dysfunction of thoracic region: Secondary | ICD-10-CM | POA: Diagnosis not present

## 2022-10-31 DIAGNOSIS — M62830 Muscle spasm of back: Secondary | ICD-10-CM | POA: Diagnosis not present

## 2022-10-31 DIAGNOSIS — M50322 Other cervical disc degeneration at C5-C6 level: Secondary | ICD-10-CM | POA: Diagnosis not present

## 2022-10-31 DIAGNOSIS — M50321 Other cervical disc degeneration at C4-C5 level: Secondary | ICD-10-CM | POA: Diagnosis not present

## 2022-10-31 DIAGNOSIS — M5413 Radiculopathy, cervicothoracic region: Secondary | ICD-10-CM | POA: Diagnosis not present

## 2022-10-31 DIAGNOSIS — M9901 Segmental and somatic dysfunction of cervical region: Secondary | ICD-10-CM | POA: Diagnosis not present

## 2022-10-31 DIAGNOSIS — M50323 Other cervical disc degeneration at C6-C7 level: Secondary | ICD-10-CM | POA: Diagnosis not present

## 2022-10-31 DIAGNOSIS — M5031 Other cervical disc degeneration,  high cervical region: Secondary | ICD-10-CM | POA: Diagnosis not present

## 2022-11-07 DIAGNOSIS — M9901 Segmental and somatic dysfunction of cervical region: Secondary | ICD-10-CM | POA: Diagnosis not present

## 2022-11-07 DIAGNOSIS — M50322 Other cervical disc degeneration at C5-C6 level: Secondary | ICD-10-CM | POA: Diagnosis not present

## 2022-11-07 DIAGNOSIS — M5413 Radiculopathy, cervicothoracic region: Secondary | ICD-10-CM | POA: Diagnosis not present

## 2022-11-07 DIAGNOSIS — M50321 Other cervical disc degeneration at C4-C5 level: Secondary | ICD-10-CM | POA: Diagnosis not present

## 2022-11-07 DIAGNOSIS — M62830 Muscle spasm of back: Secondary | ICD-10-CM | POA: Diagnosis not present

## 2022-11-07 DIAGNOSIS — M9902 Segmental and somatic dysfunction of thoracic region: Secondary | ICD-10-CM | POA: Diagnosis not present

## 2022-11-07 DIAGNOSIS — M50323 Other cervical disc degeneration at C6-C7 level: Secondary | ICD-10-CM | POA: Diagnosis not present

## 2022-11-07 DIAGNOSIS — M5031 Other cervical disc degeneration,  high cervical region: Secondary | ICD-10-CM | POA: Diagnosis not present

## 2022-11-13 DIAGNOSIS — L578 Other skin changes due to chronic exposure to nonionizing radiation: Secondary | ICD-10-CM | POA: Diagnosis not present

## 2022-11-13 DIAGNOSIS — L570 Actinic keratosis: Secondary | ICD-10-CM | POA: Diagnosis not present

## 2022-11-13 DIAGNOSIS — L821 Other seborrheic keratosis: Secondary | ICD-10-CM | POA: Diagnosis not present

## 2022-11-13 DIAGNOSIS — C44329 Squamous cell carcinoma of skin of other parts of face: Secondary | ICD-10-CM | POA: Diagnosis not present

## 2022-11-27 ENCOUNTER — Other Ambulatory Visit: Payer: Self-pay | Admitting: Family Medicine

## 2022-11-27 NOTE — Telephone Encounter (Signed)
PA started and sent to [employer] via covermymeds.

## 2022-11-28 ENCOUNTER — Telehealth: Payer: Self-pay

## 2022-11-28 MED ORDER — REPATHA SURECLICK 140 MG/ML ~~LOC~~ SOAJ: 140.0000 mg | SUBCUTANEOUS | 2 refills | Status: DC

## 2022-11-28 NOTE — Telephone Encounter (Signed)
Medication is approved. Per [facility] Pharmacist rx is go through it will cost patient $106.

## 2022-11-28 NOTE — Telephone Encounter (Signed)
PA submitted and denied via covermymeds for praluent. Repatha is preferred. Approval already on file for repatha.

## 2022-11-29 DIAGNOSIS — M1712 Unilateral primary osteoarthritis, left knee: Secondary | ICD-10-CM | POA: Diagnosis not present

## 2022-11-29 DIAGNOSIS — M25562 Pain in left knee: Secondary | ICD-10-CM | POA: Diagnosis not present

## 2022-11-29 DIAGNOSIS — R262 Difficulty in walking, not elsewhere classified: Secondary | ICD-10-CM | POA: Diagnosis not present

## 2022-11-29 DIAGNOSIS — M25662 Stiffness of left knee, not elsewhere classified: Secondary | ICD-10-CM | POA: Diagnosis not present

## 2022-12-06 ENCOUNTER — Other Ambulatory Visit: Payer: Self-pay | Admitting: Family Medicine

## 2022-12-06 ENCOUNTER — Telehealth: Payer: Self-pay

## 2022-12-06 ENCOUNTER — Other Ambulatory Visit: Payer: Self-pay

## 2022-12-06 MED ORDER — CHLORPROMAZINE HCL 25 MG PO TABS: 25.0000 mg | ORAL_TABLET | Freq: Four times a day (QID) | ORAL | 0 refills | Status: DC

## 2022-12-06 NOTE — Addendum Note (Signed)
Addended by: Precious Reel on: 12/06/2022 04:04 PM   Modules accepted: Orders

## 2022-12-06 NOTE — Telephone Encounter (Signed)
Pt called today to request a same day appointment for the following symptoms:Hiccups for several days now. Unfortunately our schedule is full and we have no openings. I spoke with Dr. Sedalia Muta who is going to [facility] [place]. Patient notified.

## 2022-12-06 NOTE — Telephone Encounter (Signed)
Christian Lowe called with complaints of intractable hiccups.  We have no available appointments today.  Dr. Sedalia Muta was notified and she sent thorazine to the pharmacy.  He was instructed to follow-up in the office if needed.

## 2022-12-25 ENCOUNTER — Telehealth: Payer: Self-pay

## 2022-12-25 ENCOUNTER — Other Ambulatory Visit: Payer: Self-pay | Admitting: Family Medicine

## 2022-12-25 MED ORDER — COLCHICINE 0.6 MG PO TABS: 0.6000 mg | ORAL_TABLET | Freq: Two times a day (BID) | ORAL | 1 refills | Status: DC

## 2022-12-25 NOTE — Telephone Encounter (Signed)
Patient called requesting refill on his gout medication. He states that he is having another gout flare up and would like it sent to [facility] [place] if you can. Please advise.

## 2022-12-25 NOTE — Telephone Encounter (Signed)
Sent colchicine. Patient notified. Dr. Sedalia Muta

## 2023-01-01 ENCOUNTER — Other Ambulatory Visit: Payer: Self-pay | Admitting: Cardiology

## 2023-01-02 NOTE — Telephone Encounter (Signed)
Rx refill sent to pharmacy. 

## 2023-01-28 NOTE — Assessment & Plan Note (Signed)
Check A1c. 

## 2023-01-28 NOTE — Assessment & Plan Note (Signed)
The current medical regimen is effective;  continue present plan and medications.  wixela1 puff twice daily 

## 2023-01-28 NOTE — Progress Notes (Signed)
Subjective:  Patient ID: Christian Lowe., male    DOB: [birth year]  Age: 82 y.o. MRN: [id number]  Chief Complaint  Patient presents with   Medical Management of Chronic Issues    HPI   Diabetes:  Not checking sugar. Eating better. Metformin 500 mg 2 times day. Currently not taking mounjaro because of cost.  Started on synjardy 12.12/998 mg once daily by [place]. He had labs there recently. We will request them.  Eye exam annually.   Hyperlipidemia: Current medications: Tolerating crestor 40 mg 1/2 pill daily. On Fish oil 1000 mg one twice a day.  Gemfibrozil 600 mg twice a day. Currently not taking repatha because of cost.    Hypertension: Current medications: Losartan 50mg  once daily.   COPD/Asthma: Current medications: on wixela 1 puff twice daily. No dyspnea. [facility].  Does not use albuterol.  Aortic Atherosclerosis: Current medications: rosuvastatin  40 mg daily, fish oil 1000 mg twice a day.  GERD: on omeprazole 20 mg daily.   BPH:   myrbetriq daily.       01/29/2023    7:35 AM 10/05/2022    3:05 PM 12/29/2021    2:47 PM 12/14/2021    8:23 AM 09/09/2021    8:13 AM  Depression screen PHQ 2/9  Decreased Interest 0 0 0 0 0  Down, Depressed, Hopeless 0 0 0 0 0  PHQ - 2 Score 0 0 0 0 0  Altered sleeping 0      Tired, decreased energy 1      Change in appetite 0      Feeling bad or failure about yourself  0      Trouble concentrating 0      Moving slowly or fidgety/restless 0      Suicidal thoughts 0      PHQ-9 Score 1      Difficult doing work/chores Not difficult at all          01/29/2023    7:36 AM  GAD 7 : Generalized Anxiety Score  Nervous, Anxious, on Edge 0  Control/stop worrying 0  Worry too much - different things 0  Trouble relaxing 0  Restless 0  Easily annoyed or irritable 0  Afraid - awful might happen 0  Total GAD 7 Score 0  Anxiety Difficulty Not difficult at all   Patient Care Team: Blane Ohara, MD as PCP - General (Family  Medicine) Zettie Pho, [facility] (Inactive) as Pharmacist (Pharmacist) [facility], [place]   Review of Systems  Constitutional:  Positive for fatigue. Negative for chills and fever.  HENT:  Negative for congestion, rhinorrhea and sore throat.   Respiratory:  Negative for cough and shortness of breath.   Cardiovascular:  Negative for chest pain and palpitations.  Gastrointestinal:  Negative for abdominal pain, constipation, diarrhea, nausea and vomiting.  Genitourinary:  Positive for frequency. Negative for dysuria and urgency.  Musculoskeletal:  Positive for back pain. Negative for arthralgias and myalgias.  Neurological:  Negative for dizziness and headaches.  Psychiatric/Behavioral:  Negative for dysphoric mood. The patient is not nervous/anxious.     Current Outpatient Medications on File Prior to Visit  Medication Sig Dispense Refill   Empagliflozin-metFORMIN HCl ER 12.12-998 MG TB24 Take 1 tablet by mouth daily.     ezetimibe (ZETIA) 10 MG tablet Take 10 mg by mouth daily.     cetirizine (ZYRTEC) 10 MG tablet Take 1 tablet (10 mg total) by mouth daily. 100 tablet 0  colchicine 0.6 MG tablet Take 1 tablet (0.6 mg total) by mouth 2 (two) times daily. 14 tablet 1   fluticasone (FLONASE) 50 MCG/ACT nasal spray Place 2 sprays into both nostrils daily. 16 g 6   fluticasone-salmeterol (WIXELA INHUB) 250-50 MCG/ACT AEPB Inhale 1 puff into the lungs in the morning and at bedtime. 3 each 1   gemfibrozil (LOPID) 600 MG tablet Take 600 mg by mouth 2 (two) times daily before a meal.     losartan (COZAAR) 100 MG tablet Take 1 tablet (100 mg total) by mouth daily. 90 tablet 1   metoprolol tartrate (LOPRESSOR) 25 MG tablet TAKE 1 TABLET BY MOUTH TWICE A DAY 180 tablet 2   Mirabegron (MYRBETRIQ PO) Take 25 tablets by mouth daily.     Omega 3 1000 MG CAPS Take 2 capsules (2,000 mg total) by mouth daily. 180 capsule 1   omeprazole (PRILOSEC) 20 MG capsule Take 20 mg by mouth daily.     rosuvastatin  (CRESTOR) 40 MG tablet Take 40 mg by mouth daily.     tadalafil (CIALIS) 10 MG tablet Take 10 mg by mouth daily as needed for erectile dysfunction.     No current facility-administered medications on file prior to visit.   Past Medical History:  Diagnosis Date   Actinic keratosis 10/31/2020   Benign prostatic hyperplasia 10/31/2020   Mar 13, 2014 Entered By: Caprice Kluver B Comment: s/p TURP 2010   Body mass index (BMI) 33.0-33.9, adult 11/23/2020   BPPV (benign paroxysmal positional vertigo)    Calculus of kidney    Calculus of kidney    Cancer (HCC) 2015   skin cancer removed off left forearm    Cataract extraction status 10/31/2020   Apr 17, 2009 Entered By: Suzette Battiest Comment: bilateral   Class 2 severe obesity due to excess calories with serious comorbidity and body mass index (BMI) of 35.0 to 35.9 in adult (HCC) 06/10/2021   Closed fracture of body of sternum 11/10/2019   Contusion of right thigh 11/10/2019   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/2022   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   Encounter for fitting and adjustment of hearing aid 03/22/2021   Encounter for immunization 06/02/2022   Epidermoid cyst of skin 10/31/2020   Fatigue 11/23/2020   Gastroesophageal reflux disease 10/31/2020   GERD (gastroesophageal reflux disease)    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   Hemorrhage of gastrointestinal tract 10/31/2020   History of basal cell carcinoma (BCC)    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   Joint pain 11/23/2020   Long-term current use of testosterone replacement therapy 11/23/2020   Low libido 11/23/2020   Mixed hyperlipidemia    Myalgia due to statin 12/14/2021   Need for immunization against influenza 06/10/2021   Osteoarthritis    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   Pain in limb 09/01/2022   Polyp of colon 11/01/2020   Aug 31, 2015 Entered By: Caprice Kluver B Comment: colonoscopy 05/2015 - needs repeat ASAP   Prediabetes    SDH (subdural  hematoma) (HCC) 02/26/2020   Shortness of breath 09/01/2022   Sleep disturbance 11/23/2020   Status post craniotomy 02/10/2020   Stiffness of unspecified hand, not elsewhere classified 06/02/2022   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   Urge incontinence 11/23/2020   Urge incontinence of urine 11/23/2020   Vitamin D deficiency    Past Surgical History:  Procedure Laterality Date   CRANIOTOMY Left 02/10/2020   Procedure: CRANIOTOMY HEMATOMA EVACUATION SUBDURAL;  Surgeon: Julio Sicks, MD;  Location: [facility] OR;  Service: Neurosurgery;  Laterality: Left;   EYE SURGERY Bilateral 2012   cataracts w lens implants   PROSTATECTOMY  2008   BPH   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   TEMPORAL ARTERY BIOPSY / LIGATION Bilateral 1997   negative   TRANSURETHRAL RESECTION OF PROSTATE  2010    Family History  Problem Relation Age of Onset   Brain cancer Mother    CAD Father    Alcoholism Father    CAD Brother    Lung cancer Brother    CAD Brother    Social History   Socioeconomic History   Marital status: Widowed    Spouse name: Not on file   Number of children: Not on file   Years of education: Not on file   Highest education level: Not on file  Occupational History   Not on file  Tobacco Use   Smoking status: Former    Packs/day: 1.00    Years: 20.00    Additional pack years: 0.00    Total pack years: 20.00    Types: Cigarettes    Quit date: 01/06/1967    Years since quitting: 56.1   Smokeless tobacco: Never  Vaping Use   Vaping Use: Never used  Substance and Sexual Activity   Alcohol use: Yes    Comment: occassionally   Drug use: No   Sexual activity: Not on file  Other Topics Concern   Not on file  Social History Narrative   Not on file   Social Determinants of Health   Financial Resource Strain: Low Risk  (10/10/2022)   Overall Financial Resource Strain (CARDIA)    Difficulty of Paying Living Expenses: Not hard at all  Food  Insecurity: No Food Insecurity (12/29/2021)   Hunger Vital Sign    Worried About Running Out of Food in the Last Year: Never true    Ran Out of Food in the Last Year: Never true  Transportation Needs: No Transportation Needs (08/16/2022)   PRAPARE - Administrator, Civil Service (Medical): No    Lack of Transportation (Non-Medical): No  Physical Activity: Inactive (10/10/2022)   Exercise Vital Sign    Days of Exercise per Week: 0 days    Minutes of Exercise per Session: 0 min  Stress: No Stress Concern Present (10/10/2022)   [employer] of Occupational Health - Occupational Stress Questionnaire    Feeling of Stress : Not at all  Social Connections: Moderately Isolated (10/10/2022)   Social Connection and Isolation Panel [NHANES]    Frequency of Communication with Friends and Family: Twice a week    Frequency of Social Gatherings with Friends and Family: Twice a week    Attends Religious Services: Never    Diplomatic Services operational officer: No    Attends Engineer, structural: Never    Marital Status: Married    Objective:  BP 110/60   Pulse 68   Temp (!) 97.2 F (36.2 C)   Resp 16   Ht 5\' 11"  (1.803 m)   Wt 225 lb (102.1 kg)   BMI 31.38 kg/m      01/29/2023    7:31 AM 10/10/2022    7:52 AM 10/05/2022    2:58 PM  BP/Weight  Systolic BP 110 132 110  Diastolic BP 60 70 68  Wt. (Lbs) 225 235 236  BMI 31.38 kg/m2 32.78 kg/m2 32.92 kg/m2    Physical Exam Vitals reviewed.  Constitutional:      Appearance: Normal appearance. He is obese.  Neck:     Vascular: No carotid bruit.  Cardiovascular:     Rate and Rhythm: Normal rate and regular rhythm.     Pulses: Normal pulses.     Heart sounds: Normal heart sounds.  Pulmonary:     Effort: Pulmonary effort is normal.     Breath sounds: Normal breath sounds. No wheezing, rhonchi or rales.  Abdominal:     General: Bowel sounds are normal.     Palpations: Abdomen is soft.     Tenderness: There is no  abdominal tenderness.  Neurological:     Mental Status: He is alert.  Psychiatric:        Mood and Affect: Mood normal.        Behavior: Behavior normal.     Diabetic Foot Exam - Simple   Simple Foot Form Diabetic Foot exam was performed with the following findings: Yes 01/29/2023  7:37 AM  Visual Inspection No deformities, no ulcerations, no other skin breakdown bilaterally: Yes Sensation Testing Intact to touch and monofilament testing bilaterally: Yes Pulse Check Posterior Tibialis and Dorsalis pulse intact bilaterally: Yes Comments Thick yellow nails.       Lab Results  Component Value Date   WBC 7.9 10/05/2022   HGB 16.6 10/05/2022   HCT 50.3 10/05/2022   PLT 211 10/05/2022   GLUCOSE 87 10/10/2022   CHOL 74 (L) 10/10/2022   TRIG 112 10/10/2022   HDL 36 (L) 10/10/2022   LDLCALC 17 10/10/2022   ALT 9 10/10/2022   AST 26 10/10/2022   NA 143 10/10/2022   K CANCELED 10/10/2022   CL 107 (H) 10/10/2022   CREATININE 1.28 (H) 10/10/2022   BUN 14 10/10/2022   CO2 15 (L) 10/10/2022   TSH 3.800 09/09/2021   INR 1.1 02/10/2020   HGBA1C 5.5 10/10/2022      Assessment & Plan:    Hypertension associated with diabetes (HCC) Assessment & Plan: Well controlled.  No changes to medicines. Continue Losartan 50mg  once daily.  Continue to work on eating a healthy diet and exercise.     COPD mixed type [facility]) Assessment & Plan: The current medical regimen is effective;  continue present plan and medications.  wixela1 puff twice daily   GERD without esophagitis Assessment & Plan: The current medical regimen is effective;  continue present plan and medications. omeprazole 20 mg daily.    Diabetic nephropathy associated with type 2 diabetes mellitus (HCC) Assessment & Plan: Control: Controlled Recommend check sugars fasting daily. Recommend check feet daily. Recommend annual eye exams. Medicines: Metformin 500 mg 2 times day. Started on synjardy 12.12/998 mg once  daily. Continue to work on eating a healthy diet and exercise.    Orders: -     Microalbumin / creatinine urine ratio  Mixed hyperlipidemia Assessment & Plan: Well controlled.  No changes to medicines. Continue crestor 20 mg daily, fish oil 1000 mg twice daily and lopid 600 mg twice daily.   Continue to work on eating a healthy diet and exercise.         No orders of the defined types were placed in this encounter.   Orders Placed This Encounter  Procedures   Microalbumin / creatinine urine ratio     Follow-up: Return in about 3 months (around 05/01/2023) for chronic fasting.   Clayborn Bigness I Leal-Borjas,acting as a scribe for Blane Ohara, MD.,have documented all relevant documentation on the behalf of Blane Ohara,  MD,as directed by  Rochel Brome, MD while in the presence of Rochel Brome, MD.   An After Visit Summary was printed and given to the patient.  I attest that I have reviewed this visit and agree with the plan scribed by my staff.   Rochel Brome, MD [facility] [phone]

## 2023-01-28 NOTE — Assessment & Plan Note (Addendum)
Well controlled.  No changes to medicines. Continue crestor 20 mg daily, fish oil 1000 mg twice daily and lopid 600 mg twice daily.   Continue to work on eating a healthy diet and exercise.

## 2023-01-28 NOTE — Assessment & Plan Note (Signed)
The current medical regimen is effective;  continue present plan and medications. omeprazole 20 mg daily.  

## 2023-01-28 NOTE — Assessment & Plan Note (Signed)
Well controlled.  No changes to medicines. Continue Losartan 50mg once daily.  'Continue to work on eating a healthy diet and exercise.  Labs drawn today.   

## 2023-01-29 ENCOUNTER — Ambulatory Visit (INDEPENDENT_AMBULATORY_CARE_PROVIDER_SITE_OTHER): Payer: Medicare HMO | Admitting: Family Medicine

## 2023-01-29 VITALS — BP 110/60 | HR 68 | Temp 97.2°F | Resp 16 | Ht 71.0 in | Wt 225.0 lb

## 2023-01-29 DIAGNOSIS — I152 Hypertension secondary to endocrine disorders: Secondary | ICD-10-CM

## 2023-01-29 DIAGNOSIS — I700 Atherosclerosis of aorta: Secondary | ICD-10-CM | POA: Diagnosis not present

## 2023-01-29 DIAGNOSIS — J449 Chronic obstructive pulmonary disease, unspecified: Secondary | ICD-10-CM

## 2023-01-29 DIAGNOSIS — Z7984 Long term (current) use of oral hypoglycemic drugs: Secondary | ICD-10-CM | POA: Diagnosis not present

## 2023-01-29 DIAGNOSIS — E782 Mixed hyperlipidemia: Secondary | ICD-10-CM

## 2023-01-29 DIAGNOSIS — E1121 Type 2 diabetes mellitus with diabetic nephropathy: Secondary | ICD-10-CM | POA: Diagnosis not present

## 2023-01-29 DIAGNOSIS — E1159 Type 2 diabetes mellitus with other circulatory complications: Secondary | ICD-10-CM | POA: Diagnosis not present

## 2023-01-29 DIAGNOSIS — K219 Gastro-esophageal reflux disease without esophagitis: Secondary | ICD-10-CM | POA: Diagnosis not present

## 2023-01-30 LAB — MICROALBUMIN / CREATININE URINE RATIO
Creatinine, Urine: 90.7 mg/dL
Microalb/Creat Ratio: 5 mg/g creat (ref 0–29)
Microalbumin, Urine: 4.3 ug/mL

## 2023-02-03 ENCOUNTER — Encounter: Payer: Self-pay | Admitting: Family Medicine

## 2023-02-08 DIAGNOSIS — M479 Spondylosis, unspecified: Secondary | ICD-10-CM | POA: Diagnosis not present

## 2023-02-08 DIAGNOSIS — K5730 Diverticulosis of large intestine without perforation or abscess without bleeding: Secondary | ICD-10-CM | POA: Diagnosis not present

## 2023-02-08 DIAGNOSIS — N200 Calculus of kidney: Secondary | ICD-10-CM | POA: Diagnosis not present

## 2023-02-08 DIAGNOSIS — M545 Low back pain, unspecified: Secondary | ICD-10-CM | POA: Diagnosis not present

## 2023-02-08 DIAGNOSIS — K4090 Unilateral inguinal hernia, without obstruction or gangrene, not specified as recurrent: Secondary | ICD-10-CM | POA: Diagnosis not present

## 2023-02-08 DIAGNOSIS — N281 Cyst of kidney, acquired: Secondary | ICD-10-CM | POA: Diagnosis not present

## 2023-02-08 DIAGNOSIS — K4020 Bilateral inguinal hernia, without obstruction or gangrene, not specified as recurrent: Secondary | ICD-10-CM | POA: Diagnosis not present

## 2023-02-08 LAB — LAB REPORT - SCANNED: EGFR: 58

## 2023-02-10 ENCOUNTER — Telehealth: Payer: Self-pay

## 2023-02-10 NOTE — Transitions of Care (Post Inpatient/ED Visit) (Signed)
   02/10/2023  Name: Christian Lowe. MRN: [id number] DOB: Dec 26, 1940  Today's TOC FU Call Status: Today's TOC FU Call Status:: Unsuccessul Call (1st Attempt) Unsuccessful Call (1st Attempt) Date: 02/10/23  Attempted to reach the patient regarding the most recent Inpatient/ED visit.  Follow Up Plan: Additional outreach attempts will be made to reach the patient to complete the Transitions of Care (Post Inpatient/ED visit) call.   Signature Milana Na, [place].

## 2023-02-12 ENCOUNTER — Telehealth: Payer: Self-pay

## 2023-02-12 NOTE — Telephone Encounter (Signed)
Left message for patient to call our office back. Per Dr. Sedalia Muta who reviewed his ER notes from 02/08/23 patient needs to follow up with Orthopedics (possibly has seen Dr. Loralie Champagne in the past) if he does not have appt scheduled, we can refer him to Ortho if he would like. Patient to call back regarding this. If he should desire to be seen here he can follow up with Huston Foley if appt is available.

## 2023-02-12 NOTE — Telephone Encounter (Signed)
Patient called wanting to schedule a hospital follow up and told patient we would have to check with provider due to not having any openings. Please advise. Patient was seen at [facility].

## 2023-02-12 NOTE — Telephone Encounter (Signed)
Retrieved notes, sitting on desk in front of me.

## 2023-02-13 ENCOUNTER — Encounter: Payer: Self-pay | Admitting: Family Medicine

## 2023-02-13 ENCOUNTER — Ambulatory Visit (INDEPENDENT_AMBULATORY_CARE_PROVIDER_SITE_OTHER): Payer: Medicare HMO | Admitting: Family Medicine

## 2023-02-13 VITALS — BP 134/70 | HR 84 | Temp 97.5°F | Ht 71.0 in | Wt 226.2 lb

## 2023-02-13 DIAGNOSIS — J441 Chronic obstructive pulmonary disease with (acute) exacerbation: Secondary | ICD-10-CM

## 2023-02-13 DIAGNOSIS — M545 Low back pain, unspecified: Secondary | ICD-10-CM

## 2023-02-13 MED ORDER — PREDNISONE 50 MG PO TABS
50.0000 mg | ORAL_TABLET | Freq: Every day | ORAL | 0 refills | Status: DC
Start: 2023-02-13 — End: 2023-02-20

## 2023-02-13 MED ORDER — TRIAMCINOLONE ACETONIDE 40 MG/ML IJ SUSP
80.00 mg | Freq: Once | INTRAMUSCULAR | Status: AC
Start: 2023-02-13 — End: 2023-02-13
  Administered 2023-02-13: 80 mg via INTRA_ARTICULAR

## 2023-02-13 MED ORDER — CEFTRIAXONE SODIUM 1 G IJ SOLR
1.00 g | Freq: Once | INTRAMUSCULAR | Status: AC
Start: 2023-02-13 — End: 2023-02-13
  Administered 2023-02-13: 1 g via INTRAMUSCULAR

## 2023-02-13 MED ORDER — ALBUTEROL SULFATE HFA 108 (90 BASE) MCG/ACT IN AERS
2.0000 | INHALATION_SPRAY | Freq: Four times a day (QID) | RESPIRATORY_TRACT | 0 refills | Status: DC | PRN
Start: 2023-02-13 — End: 2023-03-12

## 2023-02-13 MED ORDER — AMOXICILLIN-POT CLAVULANATE 875-125 MG PO TABS
1.0000 | ORAL_TABLET | Freq: Two times a day (BID) | ORAL | 0 refills | Status: DC
Start: 2023-02-13 — End: 2023-04-26

## 2023-02-13 MED ORDER — IPRATROPIUM-ALBUTEROL 0.5-2.5 (3) MG/3ML IN SOLN
3.00 mL | Freq: Once | RESPIRATORY_TRACT | Status: AC
Start: 2023-02-13 — End: 2023-02-13
  Administered 2023-02-13: 3 mL via RESPIRATORY_TRACT

## 2023-02-13 NOTE — Patient Instructions (Addendum)
Start Augmentin 1 tablet twice daily for 10 days. Prednisone 50 mg daily for 5 days. Albuterol inhaler inhale 2 puffs in lungs every 6 hours as needed.

## 2023-02-13 NOTE — Progress Notes (Unsigned)
Acute Office Visit  Subjective:    Patient ID: Christian Unis., male    DOB: 23-Dec-1940, 82 y.o.   MRN: [id number]  Chief Complaint  Patient presents with  . Cough  . Nasal Congestion    HPI: Upper respiratory symptoms He complains of congestion, cough described as productive of green sputum, and nasal congestion.with no fever, chills, night sweats or weight loss. Onset of symptoms was about a week ago and worsening.He is drinking plenty of fluids.  Past history is significant for asthma and COPD. Patient is non-smoker   Past Medical History:  Diagnosis Date  . Actinic keratosis 10/31/2020  . Benign prostatic hyperplasia 10/31/2020   Mar 13, 2014 Entered By: Caprice Kluver B Comment: s/p TURP 2010  . Body mass index (BMI) 33.0-33.9, adult 11/23/2020  . BPPV (benign paroxysmal positional vertigo)   . Calculus of kidney   . Calculus of kidney   . Cancer (HCC) 2015   skin cancer removed off left forearm   . Cataract extraction status 10/31/2020   Apr 17, 2009 Entered By: Suzette Battiest Comment: bilateral  . Class 2 severe obesity due to excess calories with serious comorbidity and body mass index (BMI) of 35.0 to 35.9 in adult [facility]) 06/10/2021  . Closed fracture of body of sternum 11/10/2019  . Contusion of right thigh 11/10/2019  . 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/2022  . 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 Encounter for fitting and adjustment of hearing aid 03/22/2021  . Encounter for immunization 06/02/2022  . Epidermoid cyst of skin 10/31/2020  . Fatigue 11/23/2020  . Gastroesophageal reflux disease 10/31/2020  . GERD (gastroesophageal reflux disease)   . 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  . Hemorrhage of gastrointestinal tract 10/31/2020  . History of basal cell carcinoma (BCC)   . 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 Joint pain 11/23/2020  . Long-term current use of testosterone replacement therapy 11/23/2020  . Low libido 11/23/2020  .  Mixed hyperlipidemia   . Myalgia due to statin 12/14/2021  . Need for immunization against influenza 06/10/2021  . Osteoarthritis   . 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  . Pain in limb 09/01/2022  . Polyp of colon 11/01/2020   Aug 31, 2015 Entered By: Caprice Kluver B Comment: colonoscopy 05/2015 - needs repeat ASAP  . Prediabetes   . 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  . Shortness of breath 09/01/2022  . Sleep disturbance 11/23/2020  . Status post craniotomy 02/10/2020  . Stiffness of unspecified hand, not elsewhere classified 06/02/2022  . 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  . 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 Urge incontinence 11/23/2020  . Urge incontinence of urine 11/23/2020  . Vitamin D deficiency     Past Surgical History:  Procedure Laterality Date  . CRANIOTOMY Left 02/10/2020   Procedure: CRANIOTOMY HEMATOMA EVACUATION SUBDURAL;  Surgeon: Julio Sicks, MD;  Location: [facility] OR;  Service: Neurosurgery;  Laterality: Left;  . EYE SURGERY Bilateral 2012   cataracts w lens implants  . PROSTATECTOMY  2008   BPH  . 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 TEMPORAL ARTERY BIOPSY / LIGATION Bilateral 1997   negative  . TRANSURETHRAL RESECTION OF PROSTATE  2010    Family History  Problem Relation Age of Onset  . Brain cancer Mother   . CAD Father   . Alcoholism Father   . CAD Brother   . Lung cancer Brother   . CAD Brother     Social History   Socioeconomic History  .  Marital status: Widowed    Spouse name: Not on file  . Number of children: Not on file  . Years of education: Not on file  . Highest education level: Not on file  Occupational History  . Not on file  Tobacco Use  . Smoking status: Former    Packs/day: 1.00    Years: 20.00    Additional pack years: 0.00    Total pack years: 20.00    Types: Cigarettes    Quit date: 01/06/1967    Years since quitting: 56.1  . Smokeless tobacco: Never  Vaping Use  . Vaping Use: Never used  Substance and  Sexual Activity  . Alcohol use: Yes    Comment: occassionally  . Drug use: No  . Sexual activity: Not on file  Other Topics Concern  . Not on file  Social History Narrative  . Not on file   Social Determinants of Health   Financial Resource Strain: Low Risk  (10/10/2022)   Overall Financial Resource Strain (CARDIA)   . Difficulty of Paying Living Expenses: Not hard at all  Food Insecurity: No Food Insecurity (12/29/2021)   Hunger Vital Sign   . Worried About Programme researcher, broadcasting/film/video in the Last Year: Never true   . Ran Out of Food in the Last Year: Never true  Transportation Needs: No Transportation Needs (08/16/2022)   PRAPARE - Transportation   . Lack of Transportation (Medical): No   . Lack of Transportation (Non-Medical): No  Physical Activity: Inactive (10/10/2022)   Exercise Vital Sign   . Days of Exercise per Week: 0 days   . Minutes of Exercise per Session: 0 min  Stress: No Stress Concern Present (10/10/2022)   [employer] of Occupational Health - Occupational Stress Questionnaire   . Feeling of Stress : Not at all  Social Connections: Moderately Isolated (10/10/2022)   Social Connection and Isolation Panel [NHANES]   . Frequency of Communication with Friends and Family: Twice a week   . Frequency of Social Gatherings with Friends and Family: Twice a week   . Attends Religious Services: Never   . Active Member of Clubs or Organizations: No   . Attends Banker Meetings: Never   . Marital Status: Married  Catering manager Violence: Not At Risk (12/29/2021)   Humiliation, Afraid, Rape, and Kick questionnaire   . Fear of Current or Ex-Partner: No   . Emotionally Abused: No   . Physically Abused: No   . Sexually Abused: No    Outpatient Medications Prior to Visit  Medication Sig Dispense Refill  . cetirizine (ZYRTEC) 10 MG tablet Take 1 tablet (10 mg total) by mouth daily. 100 tablet 0  . colchicine 0.6 MG tablet Take 1 tablet (0.6 mg total) by mouth 2 (two)  times daily. 14 tablet 1  . Empagliflozin-metFORMIN HCl ER 12.12-998 MG TB24 Take 1 tablet by mouth daily.    Marland Kitchen ezetimibe (ZETIA) 10 MG tablet Take 10 mg by mouth daily.    . fluticasone (FLONASE) 50 MCG/ACT nasal spray Place 2 sprays into both nostrils daily. 16 g 6  . fluticasone-salmeterol (WIXELA INHUB) 250-50 MCG/ACT AEPB Inhale 1 puff into the lungs in the morning and at bedtime. 3 each 1  . gemfibrozil (LOPID) 600 MG tablet Take 600 mg by mouth 2 (two) times daily before a meal.    . losartan (COZAAR) 100 MG tablet Take 1 tablet (100 mg total) by mouth daily. 90 tablet 1  . methocarbamol (ROBAXIN) 750  MG tablet Take 750 mg by mouth 3 (three) times daily.    . metoprolol tartrate (LOPRESSOR) 25 MG tablet TAKE 1 TABLET BY MOUTH TWICE A DAY 180 tablet 2  . Mirabegron (MYRBETRIQ PO) Take 25 tablets by mouth daily.    . naproxen sodium (ANAPROX) 550 MG tablet Take 550 mg by mouth 2 (two) times daily.    . Omega 3 1000 MG CAPS Take 2 capsules (2,000 mg total) by mouth daily. 180 capsule 1  . omeprazole (PRILOSEC) 20 MG capsule Take 20 mg by mouth daily.    . rosuvastatin (CRESTOR) 40 MG tablet Take 40 mg by mouth daily.    . tadalafil (CIALIS) 10 MG tablet Take 10 mg by mouth daily as needed for erectile dysfunction.     No facility-administered medications prior to visit.    Allergies  Allergen Reactions  . Atorvastatin     Other reaction(s): Muscle pain  . Codeine Other (See Comments)  . Niacin Itching    Other reaction(s): Itching, Flushing  . Piroxicam     Other reaction(s): Gastric ulcer with hemorrhage  . Pravastatin     Other reaction(s): Muscle pain  . Statins     myalgias  . Zocor [Simvastatin]     Other reaction(s): Muscle pain    Review of Systems  Constitutional:  Negative for fatigue.  HENT:  Positive for congestion (Chest) and postnasal drip. Negative for ear pain and sore throat.   Respiratory:  Positive for cough. Negative for shortness of breath.    Cardiovascular:  Negative for chest pain.  Gastrointestinal:  Negative for abdominal pain, constipation, diarrhea, nausea and vomiting.  Genitourinary:  Negative for dysuria, frequency and urgency.  Musculoskeletal:  Negative for arthralgias, back pain and myalgias.  Neurological:  Negative for dizziness and headaches.  Psychiatric/Behavioral:  Negative for agitation and sleep disturbance. The patient is not nervous/anxious.        Objective:        02/13/2023    2:09 PM 01/29/2023    7:31 AM 10/10/2022    7:52 AM  Vitals with BMI  Height 5\' 11"  5\' 11"  5\' 11"   Weight 226 lbs 3 oz 225 lbs 235 lbs  BMI 31.56 31.39 32.79  Systolic 134 110 161  Diastolic 70 60 70  Pulse 84 68 66    Orthostatic VS for the past 72 hrs (Last 3 readings):  Patient Position BP Location Cuff Size  02/13/23 1409 Sitting Left Arm Large     Physical Exam Vitals reviewed.  Constitutional:      Appearance: Normal appearance.  HENT:     Nose: Congestion present.  Cardiovascular:     Rate and Rhythm: Normal rate and regular rhythm.  Pulmonary:     Effort: Pulmonary effort is normal.     Breath sounds: Normal breath sounds.  Abdominal:     General: Bowel sounds are normal.     Palpations: Abdomen is soft.     Tenderness: There is no abdominal tenderness.  Musculoskeletal:        General: Normal range of motion.     Cervical back: Normal range of motion.  Skin:    General: Skin is warm.  Neurological:     Mental Status: He is alert and oriented to person, place, and time.  Psychiatric:        Mood and Affect: Mood normal.        Behavior: Behavior normal.   Health Maintenance Due  Topic Date Due  .  OPHTHALMOLOGY EXAM  Never done  . COVID-19 Vaccine (5 - 2023-24 season) 04/07/2022  . Medicare Annual Wellness (AWV)  12/30/2022    There are no preventive care reminders to display for this patient.   Lab Results  Component Value Date   TSH 3.800 09/09/2021   Lab Results  Component Value  Date   WBC 7.9 10/05/2022   HGB 16.6 10/05/2022   HCT 50.3 10/05/2022   MCV 85 10/05/2022   PLT 211 10/05/2022   Lab Results  Component Value Date   NA 143 10/10/2022   K CANCELED 10/10/2022   CO2 15 (L) 10/10/2022   GLUCOSE 87 10/10/2022   BUN 14 10/10/2022   CREATININE 1.28 (H) 10/10/2022   BILITOT 0.6 10/10/2022   ALKPHOS 52 10/10/2022   AST 26 10/10/2022   ALT 9 10/10/2022   PROT 6.9 10/10/2022   ALBUMIN 4.3 10/10/2022   CALCIUM 9.5 10/10/2022   ANIONGAP 11 02/11/2020   EGFR 56 (L) 10/10/2022   Lab Results  Component Value Date   CHOL 74 (L) 10/10/2022   Lab Results  Component Value Date   HDL 36 (L) 10/10/2022   Lab Results  Component Value Date   LDLCALC 17 10/10/2022   Lab Results  Component Value Date   TRIG 112 10/10/2022   Lab Results  Component Value Date   CHOLHDL 2.1 10/10/2022   Lab Results  Component Value Date   HGBA1C 5.5 10/10/2022       Assessment & Plan:  There are no diagnoses linked to this encounter.   No orders of the defined types were placed in this encounter.   No orders of the defined types were placed in this encounter.    Follow-up: No follow-ups on file.  An After Visit Summary was printed and given to the patient.  Blane Ohara, MD [facility] [phone]

## 2023-02-13 NOTE — Assessment & Plan Note (Signed)
Kenalog 80 mg injection given Rocephin 1 g injection given Duoneb treatment given  sent Augmentin 1 tablet twice daily for 10 days. Prednisone 50 mg daily for 5 days. Albuterol inhaler inhale 2 puffs in lungs every 6 hours as needed.

## 2023-02-14 DIAGNOSIS — M545 Low back pain, unspecified: Secondary | ICD-10-CM

## 2023-02-14 HISTORY — DX: Low back pain, unspecified: M54.50

## 2023-02-14 NOTE — Assessment & Plan Note (Signed)
Continue NSAID and muscle relaxant as needed.

## 2023-02-14 NOTE — Telephone Encounter (Signed)
Your question is unclear to me, can you clarify?

## 2023-02-20 ENCOUNTER — Ambulatory Visit: Payer: Medicare HMO

## 2023-02-20 VITALS — BP 138/72 | HR 63 | Resp 16 | Ht 71.0 in | Wt 227.0 lb

## 2023-02-20 DIAGNOSIS — Z0000 Encounter for general adult medical examination without abnormal findings: Secondary | ICD-10-CM

## 2023-02-20 NOTE — Progress Notes (Signed)
Subjective:   Christian Lowe. is a 82 y.o. male who presents for Medicare Annual/Subsequent preventive examination.  This wellness visit is conducted by a nurse.  The patient's medications were reviewed and reconciled since the patient's last visit.  History details were provided by the patient.  The history appears to be reliable.    Medical History: Patient history and Family history was reviewed  Medications, Allergies, and preventative health maintenance was reviewed and updated.   Visit Complete: In person  Cardiac Risk Factors include: advanced age (>6men, >4 women);diabetes mellitus;male gender;obesity (BMI >30kg/m2)     Objective:    Today's Vitals   02/20/23 1038  BP: 138/72  Pulse: 63  Resp: 16  SpO2: 96%  Weight: 227 lb (103 kg)  Height: 5\' 11"  (1.803 m)  PainSc: 0-No pain   Body mass index is 31.66 kg/m.     02/11/2020    1:00 AM 12/30/2019    3:29 PM 01/10/2016    8:00 AM  Advanced Directives  Does Patient Have a Medical Advance Directive? No Yes Yes  Type of Special educational needs teacher of [place];Living will   Does patient want to make changes to medical advance directive?  Yes (MAU/Ambulatory/Procedural Areas - Information given)   Copy of Healthcare Power of Attorney in Chart?  No - copy requested No - copy requested  Would patient like information on creating a medical advance directive? No - Patient declined      Current Medications (verified) Outpatient Encounter Medications as of 02/20/2023  Medication Sig   albuterol (VENTOLIN HFA) 108 (90 Base) MCG/ACT inhaler Inhale 2 puffs into the lungs every 6 (six) hours as needed for wheezing or shortness of breath.   amoxicillin-clavulanate (AUGMENTIN) 875-125 MG tablet Take 1 tablet by mouth 2 (two) times daily.   cetirizine (ZYRTEC) 10 MG tablet Take 1 tablet (10 mg total) by mouth daily.   colchicine 0.6 MG tablet Take 1 tablet (0.6 mg total) by mouth 2 (two) times daily.    Empagliflozin-metFORMIN HCl ER 12.12-998 MG TB24 Take 1 tablet by mouth daily.   ezetimibe (ZETIA) 10 MG tablet Take 10 mg by mouth daily.   fluticasone (FLONASE) 50 MCG/ACT nasal spray Place 2 sprays into both nostrils daily.   fluticasone-salmeterol (WIXELA INHUB) 250-50 MCG/ACT AEPB Inhale 1 puff into the lungs in the morning and at bedtime.   gemfibrozil (LOPID) 600 MG tablet Take 600 mg by mouth 2 (two) times daily before a meal.   losartan (COZAAR) 100 MG tablet Take 1 tablet (100 mg total) by mouth daily.   methocarbamol (ROBAXIN) 750 MG tablet Take 750 mg by mouth 3 (three) times daily.   metoprolol tartrate (LOPRESSOR) 25 MG tablet TAKE 1 TABLET BY MOUTH TWICE A DAY   Mirabegron (MYRBETRIQ PO) Take 25 tablets by mouth daily.   MOUNJARO 7.5 MG/0.5ML Pen SMARTSIG:7.5 Milligram(s) SUB-Q Once a Week   naproxen sodium (ANAPROX) 550 MG tablet Take 550 mg by mouth 2 (two) times daily.   Omega 3 1000 MG CAPS Take 2 capsules (2,000 mg total) by mouth daily.   omeprazole (PRILOSEC) 20 MG capsule Take 20 mg by mouth daily.   rosuvastatin (CRESTOR) 40 MG tablet Take 40 mg by mouth daily.   tadalafil (CIALIS) 10 MG tablet Take 10 mg by mouth daily as needed for erectile dysfunction.   [DISCONTINUED] predniSONE (DELTASONE) 50 MG tablet Take 1 tablet (50 mg total) by mouth daily with breakfast.   No facility-administered encounter medications  on file as of 02/20/2023.    Allergies (verified) Atorvastatin, Codeine, Niacin, Piroxicam, Pravastatin, Statins, and Zocor [simvastatin]   History: Past Medical History:  Diagnosis Date   Actinic keratosis 10/31/2020   Benign prostatic hyperplasia 10/31/2020   Mar 13, 2014 Entered By: Caprice Kluver B Comment: s/p TURP 2010   Body mass index (BMI) 33.0-33.9, adult 11/23/2020   BPPV (benign paroxysmal positional vertigo)    Calculus of kidney    Calculus of kidney    Cancer (HCC) 2015   skin cancer removed off left forearm    Cataract extraction  status 10/31/2020   Apr 17, 2009 Entered By: Suzette Battiest Comment: bilateral   Class 2 severe obesity due to excess calories with serious comorbidity and body mass index (BMI) of 35.0 to 35.9 in adult (HCC) 06/10/2021   Closed fracture of body of sternum 11/10/2019   Contusion of right thigh 11/10/2019   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/2022   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   Encounter for fitting and adjustment of hearing aid 03/22/2021   Encounter for immunization 06/02/2022   Epidermoid cyst of skin 10/31/2020   Fatigue 11/23/2020   Gastroesophageal reflux disease 10/31/2020   GERD (gastroesophageal reflux disease)    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   Hemorrhage of gastrointestinal tract 10/31/2020   History of basal cell carcinoma (BCC)    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   Joint pain 11/23/2020   Long-term current use of testosterone replacement therapy 11/23/2020   Low libido 11/23/2020   Mixed hyperlipidemia    Myalgia due to statin 12/14/2021   Need for immunization against influenza 06/10/2021   Osteoarthritis    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   Pain in limb 09/01/2022   Polyp of colon 11/01/2020   Aug 31, 2015 Entered By: Caprice Kluver B Comment: colonoscopy 05/2015 - needs repeat ASAP   Prediabetes    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   Shortness of breath 09/01/2022   Sleep disturbance 11/23/2020   Status post craniotomy 02/10/2020   Stiffness of unspecified hand, not elsewhere classified 06/02/2022   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   Urge incontinence 11/23/2020   Urge incontinence of urine 11/23/2020   Vitamin D deficiency    Past Surgical History:  Procedure Laterality Date   CRANIOTOMY Left 02/10/2020   Procedure: CRANIOTOMY HEMATOMA EVACUATION SUBDURAL;  Surgeon: Julio Sicks, MD;  Location: [facility] OR;  Service: Neurosurgery;  Laterality: Left;   EYE SURGERY Bilateral 2012   cataracts  w lens implants   PROSTATECTOMY  2008   BPH   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   TEMPORAL ARTERY BIOPSY / LIGATION Bilateral 1997   negative   TRANSURETHRAL RESECTION OF PROSTATE  2010   Family History  Problem Relation Age of Onset   Brain cancer Mother    CAD Father    Alcoholism Father    CAD Brother    Lung cancer Brother    CAD Brother    Social History   Socioeconomic History   Marital status: Widowed    Spouse name: Not on file   Number of children: Not on file   Years of education: Not on file   Highest education level: Not on file  Occupational History   Not on file  Tobacco Use   Smoking status: Former    Current packs/day: 0.00    Average packs/day: 1 pack/day for 20.0 years (20.0 ttl pk-yrs)    Types: Cigarettes  Start date: 01/06/1947    Quit date: 01/06/1967    Years since quitting: 56.1   Smokeless tobacco: Never  Vaping Use   Vaping status: Never Used  Substance and Sexual Activity   Alcohol use: Yes    Comment: occassionally   Drug use: No   Sexual activity: Not on file  Other Topics Concern   Not on file  Social History Narrative   Not on file   Social Determinants of Health   Financial Resource Strain: Low Risk  (02/20/2023)   Overall Financial Resource Strain (CARDIA)    Difficulty of Paying Living Expenses: Not hard at all  Food Insecurity: No Food Insecurity (02/20/2023)   Hunger Vital Sign    Worried About Running Out of Food in the Last Year: Never true    Ran Out of Food in the Last Year: Never true  Transportation Needs: No Transportation Needs (02/20/2023)   PRAPARE - Administrator, Civil Service (Medical): No    Lack of Transportation (Non-Medical): No  Physical Activity: Sufficiently Active (02/20/2023)   Exercise Vital Sign    Days of Exercise per Week: 5 days    Minutes of Exercise per Session: 30 min  Stress: No Stress Concern Present (02/20/2023)   [employer] of Occupational Health - Occupational Stress  Questionnaire    Feeling of Stress : Not at all  Social Connections: Moderately Isolated (02/20/2023)   Social Connection and Isolation Panel [NHANES]    Frequency of Communication with Friends and Family: Twice a week    Frequency of Social Gatherings with Friends and Family: Twice a week    Attends Religious Services: Never    Database administrator or Organizations: No    Attends Engineer, structural: Never    Marital Status: Married    Tobacco Counseling Counseling given: Not Answered   Clinical Intake:  Pre-visit preparation completed: Yes Pain : No/denies pain Pain Score: 0-No pain   BMI - recorded: 31.66 Nutritional Status: BMI > 30  Obese Nutritional Risks: None Diabetes: Yes (Most recent A1C 5.5) CBG done?: No Did pt. bring in CBG monitor from home?: No How often do you need to have someone help you when you read instructions, pamphlets, or other written materials from your doctor or pharmacy?: 1 - Never Interpreter Needed?: No     Activities of Daily Living    02/20/2023   10:48 AM  In your present state of health, do you have any difficulty performing the following activities:  Hearing? 1  Comment corrected with hearing aids  Vision? 0  Difficulty concentrating or making decisions? 0  Walking or climbing stairs? 0  Dressing or bathing? 0  Doing errands, shopping? 0  Preparing Food and eating ? N  Using the Toilet? N  In the past six months, have you accidently leaked urine? N  Do you have problems with loss of bowel control? N  Managing your Medications? N  Managing your Finances? N  Housekeeping or managing your Housekeeping? N    Patient Care Team: Blane Ohara, MD as PCP - General (Family Medicine) Zettie Pho, [facility] (Inactive) as Pharmacist (Pharmacist) [facility], [place]     Assessment:   This is a routine wellness examination for [employer].  Hearing/Vision screen No results found.  Dietary issues and exercise activities  discussed: Aim for 30 minutes of exercise or brisk walking, 6-8 glasses of water, and 5 servings of fruits and vegetables each day.  Goals Addressed             This Visit's Progress    Exercise 3x per week (30 min per time)       02/20/2023 AWV Goal: Exercise for General Health  Patient will verbalize understanding of the benefits of increased physical activity: Exercising regularly is important. It will improve your overall fitness, flexibility, and endurance. Regular exercise also will improve your overall health. It can help you control your weight, reduce stress, and improve your bone density. Over the next year, patient will increase physical activity as tolerated with a goal of at least 150 minutes of moderate physical activity per week.  You can tell that you are exercising at a moderate intensity if your heart starts beating faster and you start breathing faster but can still hold a conversation. Moderate-intensity exercise ideas include: Walking 1 mile (1.6 km) in about 15 minutes Biking Hiking Golfing Dancing Water aerobics Patient will verbalize understanding of everyday activities that increase physical activity by providing examples like the following: Yard work, such as: Insurance underwriter Gardening Washing windows or floors Patient will be able to explain general safety guidelines for exercising:  Before you start a new exercise program, talk with your health care provider. Do not exercise so much that you hurt yourself, feel dizzy, or get very short of breath. Wear comfortable clothes and wear shoes with good support. Drink plenty of water while you exercise to prevent dehydration or heat stroke. Work out until your breathing and your heartbeat get faster.      Prevent falls   On track    02/20/2023 AWV Goal: Fall Prevention  Over the next year, patient will decrease their  risk for falls by: Using assistive devices, such as a cane or walker, as needed Identifying fall risks within their home and correcting them by: Removing throw rugs Adding handrails to stairs or ramps Removing clutter and keeping a clear pathway throughout the home Increasing light, especially at night Adding shower handles/bars Raising toilet seat Identifying potential personal risk factors for falls: Medication side effects Incontinence/urgency Vestibular dysfunction Hearing loss Musculoskeletal disorders Neurological disorders Orthostatic hypotension         Depression Screen    02/20/2023   10:45 AM 01/29/2023    7:35 AM 10/05/2022    3:05 PM 12/29/2021    2:47 PM 12/14/2021    8:23 AM 09/09/2021    8:13 AM 07/08/2021   11:25 AM  PHQ 2/9 Scores  PHQ - 2 Score 0 0 0 0 0 0 0  PHQ- 9 Score 1 1         Fall Risk    02/20/2023   10:47 AM 01/29/2023    7:34 AM 10/05/2022    3:05 PM 07/06/2022    8:00 AM 12/29/2021    2:48 PM  Fall Risk   Falls in the past year? 0 0 0 0 0  Number falls in past yr: 0 0 0 0 0  Injury with Fall? 0 0 0 0 0  Risk for fall due to : No Fall Risks No Fall Risks History of fall(s) No Fall Risks No Fall Risks  Follow up Falls evaluation completed;Education provided Falls evaluation completed;Falls prevention discussed Falls evaluation completed;Falls prevention discussed Falls evaluation completed Falls evaluation completed;Education provided    MEDICARE RISK AT HOME:  Medicare Risk at Home - 02/20/23 0949     Any  stairs in or around the home? No    If so, are there any without handrails? No    Home free of loose throw rugs in walkways, pet beds, electrical cords, etc? Yes    Adequate lighting in your home to reduce risk of falls? Yes    Life alert? No    Use of a cane, walker or w/c? No    Grab bars in the bathroom? Yes    Shower chair or bench in shower? Yes    Elevated toilet seat or a handicapped toilet? No             TIMED UP AND  GO:  Was the test performed?  Yes  Length of time to ambulate 10 feet: 6 sec Gait steady and fast without use of assistive device    Cognitive Function:        02/20/2023   10:49 AM 12/29/2021    2:53 PM 12/30/2019    3:32 PM  6CIT Screen  What Year? 0 points 0 points 0 points  What month? 0 points 0 points 0 points  What time? 0 points 0 points 0 points  Count back from 20 0 points 0 points 0 points  Months in reverse 0 points 0 points 0 points  Repeat phrase 0 points 0 points 0 points  Total Score 0 points 0 points 0 points    Immunizations Immunization History  Administered Date(s) Administered   Fluad Quad(high Dose 65+) 04/08/2020, 06/09/2021, 07/06/2022   Influenza Split 05/08/2015, 04/26/2016   Influenza, High Dose Seasonal PF 05/07/2017   Influenza, Seasonal, Injecte, Preservative Fre 05/05/2015   Influenza-Unspecified 06/07/2001, 05/07/2002, 05/07/2004, 06/07/2006, 06/08/2007, 05/25/2008, 05/16/2009, 05/07/2010, 05/07/2013, 04/07/2014, 05/21/2021, 06/21/2022   Moderna Covid-19 Vaccine Bivalent Booster 59yrs & up 11/28/2021   PFIZER Comirnaty(Gray Top)Covid-19 Tri-Sucrose Vaccine 12/15/2020   PFIZER(Purple Top)SARS-COV-2 Vaccination 07/28/2020, 08/19/2020   PNEUMOCOCCAL CONJUGATE-20 10/10/2021   Pneumococcal Conjugate-13 09/24/2014, 10/04/2016   Pneumococcal Polysaccharide-23 06/07/2005, 04/17/2018   Rsv, Mab, Judi Cong, 0.5 Ml, Neonate To 24 Mos(Beyfortus) 01/15/2023   Td 04/07/2018   Tdap 03/13/2014, 04/17/2018   Zoster Recombinant(Shingrix) 07/18/2021, 09/20/2021   Zoster, Live 07/28/2013, 09/24/2014    TDAP status: Up to date  Flu Vaccine status: Up to date  Pneumococcal vaccine status: Up to date  Qualifies for Shingles Vaccine? Yes   Zostavax completed Yes   Shingrix Completed?: Yes  Screening Tests Health Maintenance  Topic Date Due   OPHTHALMOLOGY EXAM  Never done   COVID-19 Vaccine (5 - 2023-24 season) 04/07/2022   Medicare Annual  Wellness (AWV)  12/30/2022   INFLUENZA VACCINE  03/08/2023   HEMOGLOBIN A1C  04/12/2023   Diabetic kidney evaluation - eGFR measurement  10/10/2023   Diabetic kidney evaluation - Urine ACR  01/29/2024   FOOT EXAM  01/29/2024   DTaP/Tdap/Td (4 - Td or Tdap) 04/17/2028   Pneumonia Vaccine 57+ Years old  Completed   Zoster Vaccines- Shingrix  Completed   HPV VACCINES  Aged Out    Health Maintenance  Health Maintenance Due  Topic Date Due   OPHTHALMOLOGY EXAM  Never done   COVID-19 Vaccine (5 - 2023-24 season) 04/07/2022   Medicare Annual Wellness (AWV)  12/30/2022    Colorectal cancer screening: No longer required.   Lung Cancer Screening: (Low Dose CT Chest recommended if Age 59-80 years, 20 pack-year currently smoking OR have quit w/in 15years.) does not qualify.   Lung Cancer Screening Referral: N/A  Additional Screening:  Vision Screening: Recommended annual ophthalmology exams  for early detection of glaucoma and other disorders of the eye. Is the patient up to date with their annual eye exam?  No  Who is the provider or what is the name of the office in which the patient attends annual eye exams? [facility]  Dental Screening: Recommended annual dental exams for proper oral hygiene   Community Resource Referral / Chronic Care Management: CRR required this visit?  No   CCM required this visit?  No     Plan:    Counseling was provided today regarding the following topics: healthy eating habits, home safety, vitamin and mineral supplementation (calcium and Vit D), regular exercise, tobacco avoidance, limitation of alcohol intake, use of seat belts, firearm safety, and fall prevention.  Annual recommendations include: influenza vaccine, dental cleanings, and eye exams.   I have personally reviewed and noted the following in the patient's chart:   Medical and social history Use of alcohol, tobacco or illicit drugs  Current medications and supplements including opioid  prescriptions.  Functional ability and status Nutritional status Physical activity Advanced directives List of other physicians Hospitalizations, surgeries, and ER visits in previous 12 months Vitals Screenings to include cognitive, depression, and falls Referrals and appointments  In addition, I have reviewed and discussed with patient certain preventive protocols, quality metrics, and best practice recommendations. A written personalized care plan for preventive services as well as general preventive health recommendations were provided to patient.     KARRIEM MUENCH, LPN   1/61/0960

## 2023-02-20 NOTE — Patient Instructions (Signed)
[name] , Thank you for taking time to come for your Medicare Wellness Visit. I appreciate your ongoing commitment to your health goals. Please review the following plan we discussed and let me know if I can assist you in the future.   These are the goals we discussed:  Goals      Exercise 3x per week (30 min per time)     02/20/2023 AWV Goal: Exercise for General Health  Patient will verbalize understanding of the benefits of increased physical activity: Exercising regularly is important. It will improve your overall fitness, flexibility, and endurance. Regular exercise also will improve your overall health. It can help you control your weight, reduce stress, and improve your bone density. Over the next year, patient will increase physical activity as tolerated with a goal of at least 150 minutes of moderate physical activity per week.  You can tell that you are exercising at a moderate intensity if your heart starts beating faster and you start breathing faster but can still hold a conversation. Moderate-intensity exercise ideas include: Walking 1 mile (1.6 km) in about 15 minutes Biking Hiking Golfing Dancing Water aerobics Patient will verbalize understanding of everyday activities that increase physical activity by providing examples like the following: Yard work, such as: Insurance underwriter Gardening Washing windows or floors Patient will be able to explain general safety guidelines for exercising:  Before you start a new exercise program, talk with your health care provider. Do not exercise so much that you hurt yourself, feel dizzy, or get very short of breath. Wear comfortable clothes and wear shoes with good support. Drink plenty of water while you exercise to prevent dehydration or heat stroke. Work out until your breathing and your heartbeat get faster.      Prevent falls      02/20/2023 AWV Goal: Fall Prevention  Over the next year, patient will decrease their risk for falls by: Using assistive devices, such as a cane or walker, as needed Identifying fall risks within their home and correcting them by: Removing throw rugs Adding handrails to stairs or ramps Removing clutter and keeping a clear pathway throughout the home Increasing light, especially at night Adding shower handles/bars Raising toilet seat Identifying potential personal risk factors for falls: Medication side effects Incontinence/urgency Vestibular dysfunction Hearing loss Musculoskeletal disorders Neurological disorders Orthostatic hypotension          This is a list of the screening recommended for you and due dates:  Health Maintenance  Topic Date Due   Eye exam for diabetics  Never done   COVID-19 Vaccine (5 - 2023-24 season) 04/07/2022   Flu Shot  03/08/2023   Hemoglobin A1C  04/12/2023   Yearly kidney function blood test for diabetes  10/10/2023   Yearly kidney health urinalysis for diabetes  01/29/2024   Complete foot exam   01/29/2024   Medicare Annual Wellness Visit  02/20/2024   DTaP/Tdap/Td vaccine (4 - Td or Tdap) 04/17/2028   Pneumonia Vaccine  Completed   Zoster (Shingles) Vaccine  Completed   HPV Vaccine  Aged Out   REMINDER: Call to schedule your eye exam  Preventive Care 65 Years and Older, Male  Preventive care refers to lifestyle choices and visits with your health care provider that can promote health and wellness. What does preventive care include? A yearly physical exam. This is also called an annual well check. Dental exams once or twice  a year. Routine eye exams. Ask your health care provider how often you should have your eyes checked. Personal lifestyle choices, including: Daily care of your teeth and gums. Regular physical activity. Eating a healthy diet. Avoiding tobacco and drug use. Limiting alcohol use. Practicing safe sex. Taking low  doses of aspirin every day. Taking vitamin and mineral supplements as recommended by your health care provider. What happens during an annual well check? The services and screenings done by your health care provider during your annual well check will depend on your age, overall health, lifestyle risk factors, and family history of disease. Counseling  Your health care provider may ask you questions about your: Alcohol use. Tobacco use. Drug use. Emotional well-being. Home and relationship well-being. Sexual activity. Eating habits. History of falls. Memory and ability to understand (cognition). Work and work Astronomer. Screening  You may have the following tests or measurements: Height, weight, and BMI. Blood pressure. Lipid and cholesterol levels. These may be checked every 5 years, or more frequently if you are over 58 years old. Skin check. Lung cancer screening. You may have this screening every year starting at age 4 if you have a 30-pack-year history of smoking and currently smoke or have quit within the past 15 years. Fecal occult blood test (FOBT) of the stool. You may have this test every year starting at age 35. Flexible sigmoidoscopy or colonoscopy. You may have a sigmoidoscopy every 5 years or a colonoscopy every 10 years starting at age 26. Prostate cancer screening. Recommendations will vary depending on your family history and other risks. Hepatitis C blood test. Hepatitis B blood test. Sexually transmitted disease (STD) testing. Diabetes screening. This is done by checking your blood sugar (glucose) after you have not eaten for a while (fasting). You may have this done every 1-3 years. Abdominal aortic aneurysm (AAA) screening. You may need this if you are a current or former smoker. Osteoporosis. You may be screened starting at age 40 if you are at high risk. Talk with your health care provider about your test results, treatment options, and if necessary, the need  for more tests. Vaccines  Your health care provider may recommend certain vaccines, such as: Influenza vaccine. This is recommended every year. Tetanus, diphtheria, and acellular pertussis (Tdap, Td) vaccine. You may need a Td booster every 10 years. Zoster vaccine. You may need this after age 71. Pneumococcal 13-valent conjugate (PCV13) vaccine. One dose is recommended after age 3. Pneumococcal polysaccharide (PPSV23) vaccine. One dose is recommended after age 60. Talk to your health care provider about which screenings and vaccines you need and how often you need them. This information is not intended to replace advice given to you by your health care provider. Make sure you discuss any questions you have with your health care provider. Document Released: 08/20/2015 Document Revised: 04/12/2016 Document Reviewed: 05/25/2015 Elsevier Interactive Patient Education  2017 [employer].  Fall Prevention in the Home Falls can cause injuries. They can happen to people of all ages. There are many things you can do to make your home safe and to help prevent falls. What can I do on the outside of my home? Regularly fix the edges of walkways and driveways and fix any cracks. Remove anything that might make you trip as you walk through a door, such as a raised step or threshold. Trim any bushes or trees on the path to your home. Use bright outdoor lighting. Clear any walking paths of anything that might make  someone trip, such as rocks or tools. Regularly check to see if handrails are loose or broken. Make sure that both sides of any steps have handrails. Any raised decks and porches should have guardrails on the edges. Have any leaves, snow, or ice cleared regularly. Use sand or salt on walking paths during winter. Clean up any spills in your garage right away. This includes oil or grease spills. What can I do in the bathroom? Use night lights. Install grab bars by the toilet and in the tub and  shower. Do not use towel bars as grab bars. Use non-skid mats or decals in the tub or shower. If you need to sit down in the shower, use a plastic, non-slip stool. Keep the floor dry. Clean up any water that spills on the floor as soon as it happens. Remove soap buildup in the tub or shower regularly. Attach bath mats securely with double-sided non-slip rug tape. Do not have throw rugs and other things on the floor that can make you trip. What can I do in the bedroom? Use night lights. Make sure that you have a light by your bed that is easy to reach. Do not use any sheets or blankets that are too big for your bed. They should not hang down onto the floor. Have a firm chair that has side arms. You can use this for support while you get dressed. Do not have throw rugs and other things on the floor that can make you trip. What can I do in the kitchen? Clean up any spills right away. Avoid walking on wet floors. Keep items that you use a lot in easy-to-reach places. If you need to reach something above you, use a strong step stool that has a grab bar. Keep electrical cords out of the way. Do not use floor polish or wax that makes floors slippery. If you must use wax, use non-skid floor wax. Do not have throw rugs and other things on the floor that can make you trip. What can I do with my stairs? Do not leave any items on the stairs. Make sure that there are handrails on both sides of the stairs and use them. Fix handrails that are broken or loose. Make sure that handrails are as long as the stairways. Check any carpeting to make sure that it is firmly attached to the stairs. Fix any carpet that is loose or worn. Avoid having throw rugs at the top or bottom of the stairs. If you do have throw rugs, attach them to the floor with carpet tape. Make sure that you have a light switch at the top of the stairs and the bottom of the stairs. If you do not have them, ask someone to add them for  you. What else can I do to help prevent falls? Wear shoes that: Do not have high heels. Have rubber bottoms. Are comfortable and fit you well. Are closed at the toe. Do not wear sandals. If you use a stepladder: Make sure that it is fully opened. Do not climb a closed stepladder. Make sure that both sides of the stepladder are locked into place. Ask someone to hold it for you, if possible. Clearly mark and make sure that you can see: Any grab bars or handrails. First and last steps. Where the edge of each step is. Use tools that help you move around (mobility aids) if they are needed. These include: Canes. Walkers. Scooters. Crutches. Turn on the lights when you  go into a dark area. Replace any light bulbs as soon as they burn out. Set up your furniture so you have a clear path. Avoid moving your furniture around. If any of your floors are uneven, fix them. If there are any pets around you, be aware of where they are. Review your medicines with your doctor. Some medicines can make you feel dizzy. This can increase your chance of falling. Ask your doctor what other things that you can do to help prevent falls. This information is not intended to replace advice given to you by your health care provider. Make sure you discuss any questions you have with your health care provider. Document Released: 05/20/2009 Document Revised: 12/30/2015 Document Reviewed: 08/28/2014 Elsevier Interactive Patient Education  2017 [employer].

## 2023-03-07 ENCOUNTER — Encounter: Payer: Self-pay | Admitting: Cardiology

## 2023-03-07 ENCOUNTER — Ambulatory Visit: Payer: Medicare HMO | Attending: Cardiology | Admitting: Cardiology

## 2023-03-07 VITALS — BP 136/66 | HR 90 | Ht 71.0 in | Wt 222.6 lb

## 2023-03-07 DIAGNOSIS — I700 Atherosclerosis of aorta: Secondary | ICD-10-CM | POA: Diagnosis not present

## 2023-03-07 DIAGNOSIS — I152 Hypertension secondary to endocrine disorders: Secondary | ICD-10-CM

## 2023-03-07 DIAGNOSIS — E1121 Type 2 diabetes mellitus with diabetic nephropathy: Secondary | ICD-10-CM | POA: Diagnosis not present

## 2023-03-07 DIAGNOSIS — I493 Ventricular premature depolarization: Secondary | ICD-10-CM | POA: Diagnosis not present

## 2023-03-07 DIAGNOSIS — Z7985 Long-term (current) use of injectable non-insulin antidiabetic drugs: Secondary | ICD-10-CM | POA: Diagnosis not present

## 2023-03-07 DIAGNOSIS — R0609 Other forms of dyspnea: Secondary | ICD-10-CM | POA: Diagnosis not present

## 2023-03-07 DIAGNOSIS — E1159 Type 2 diabetes mellitus with other circulatory complications: Secondary | ICD-10-CM | POA: Diagnosis not present

## 2023-03-07 NOTE — Patient Instructions (Addendum)
Medication Instructions:  Your physician recommends that you continue on your current medications as directed. Please refer to the Current Medication list given to you today.  *If you need a refill on your cardiac medications before your next appointment, please call your pharmacy*   Lab Work: None Ordered If you have labs (blood work) drawn today and your tests are completely normal, you will receive your results only by: MyChart Message (if you have MyChart) OR A paper copy in the mail If you have any lab test that is abnormal or we need to change your treatment, we will call you to review the results.   Testing/Procedures: Your physician has requested that you have an echocardiogram. Echocardiography is a painless test that uses sound waves to create images of your heart. It provides your doctor with information about the size and shape of your heart and how well your heart's chambers and valves are working. This procedure takes approximately one hour. There are no restrictions for this procedure. Please do NOT wear cologne, perfume, aftershave, or lotions (deodorant is allowed). Please arrive 15 minutes prior to your appointment time.    Follow-Up: At [facility], you and your health needs are our priority.  As part of our continuing mission to provide you with exceptional heart care, we have created designated Provider Care Teams.  These Care Teams include your primary Cardiologist (physician) and Advanced Practice Providers (APPs -  Physician Assistants and Nurse Practitioners) who all work together to provide you with the care you need, when you need it.  We recommend signing up for the patient portal called "MyChart".  Sign up information is provided on this After Visit Summary.  MyChart is used to connect with patients for Virtual Visits (Telemedicine).  Patients are able to view lab/test results, encounter notes, upcoming appointments, etc.  Non-urgent messages can be sent to your  provider as well.   To learn more about what you can do with MyChart, go to [email or web address].    Your next appointment:   12 month(s)  The format for your next appointment:   In Person  Provider:   Robert Krasowski, MD    Other Instructions NA  

## 2023-03-07 NOTE — Addendum Note (Signed)
Addended by: Baldo Ash D on: 03/07/2023 03:57 PM   Modules accepted: Orders

## 2023-03-07 NOTE — Progress Notes (Signed)
Cardiology Office Note:    Date:  03/07/2023   ID:  Christian Lowe., DOB 07/12/1941, MRN [id number]  PCP:  Blane Ohara, MD  Cardiologist:  Gypsy Balsam, MD    Referring MD: Blane Ohara, MD   Chief Complaint  Patient presents with   Follow-up    History of Present Illness:    Christian Lowe. is a 82 y.o. male   with past medical history significant for essential hypertension, dyslipidemia with intolerance to multiple medications now he is on PCSK9 agent that he is LDL is very well controlled, calcification of the coronary artery noted on the CT of the abdomen couple years ago.  He was referred to [place] because he went to donate platelets like he does for many years he was noted to have skipped beats and he was sent to [place].  He is EKG showed PVCs with origin from LV.  Monitor he wear show a lot of burden of PVCs of 10.9%, no sustained arrhythmia, echocardiogram showed low normal ejection fraction.  He comes today to follow-up.  He is on low-dose of beta-blocker only 25 mg of Toprol twice daily he said he is feeling fine.  Denies have any palpitation tightness squeezing pressure burning chest.  He is very active in the matter of fact before he came to our office today he was cutting grass for couple hours and then weed eating for 2 hours with no difficulties.  Past Medical History:  Diagnosis Date   Actinic keratosis 10/31/2020   Benign prostatic hyperplasia 10/31/2020   Mar 13, 2014 Entered By: Caprice Kluver B Comment: s/p TURP 2010   Body mass index (BMI) 33.0-33.9, adult 11/23/2020   BPPV (benign paroxysmal positional vertigo)    Calculus of kidney    Calculus of kidney    Cancer (HCC) 2015   skin cancer removed off left forearm    Cataract extraction status 10/31/2020   Apr 17, 2009 Entered By: Suzette Battiest Comment: bilateral   Class 2 severe obesity due to excess calories with serious comorbidity and body mass index (BMI) of 35.0 to 35.9 in adult (HCC) 06/10/2021    Closed fracture of body of sternum 11/10/2019   Contusion of right thigh 11/10/2019   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/2022   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   Encounter for fitting and adjustment of hearing aid 03/22/2021   Encounter for immunization 06/02/2022   Epidermoid cyst of skin 10/31/2020   Fatigue 11/23/2020   Gastroesophageal reflux disease 10/31/2020   GERD (gastroesophageal reflux disease)    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   Hemorrhage of gastrointestinal tract 10/31/2020   History of basal cell carcinoma (BCC)    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   Joint pain 11/23/2020   Long-term current use of testosterone replacement therapy 11/23/2020   Low libido 11/23/2020   Mixed hyperlipidemia    Myalgia due to statin 12/14/2021   Need for immunization against influenza 06/10/2021   Osteoarthritis    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   Pain in limb 09/01/2022   Polyp of colon 11/01/2020   Aug 31, 2015 Entered By: Caprice Kluver B Comment: colonoscopy 05/2015 - needs repeat ASAP   Prediabetes    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   Shortness of breath 09/01/2022   Sleep disturbance 11/23/2020   Status post craniotomy 02/10/2020   Stiffness of unspecified hand, not elsewhere classified 06/02/2022   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   Urge incontinence  11/23/2020   Urge incontinence of urine 11/23/2020   Vitamin D deficiency     Past Surgical History:  Procedure Laterality Date   CRANIOTOMY Left 02/10/2020   Procedure: CRANIOTOMY HEMATOMA EVACUATION SUBDURAL;  Surgeon: Julio Sicks, MD;  Location: [facility] OR;  Service: Neurosurgery;  Laterality: Left;   EYE SURGERY Bilateral 2012   cataracts w lens implants   PROSTATECTOMY  2008   BPH   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   TEMPORAL ARTERY BIOPSY / LIGATION Bilateral 1997   negative   TRANSURETHRAL RESECTION OF PROSTATE  2010    Current  Medications: Current Meds  Medication Sig   albuterol (VENTOLIN HFA) 108 (90 Base) MCG/ACT inhaler Inhale 2 puffs into the lungs every 6 (six) hours as needed for wheezing or shortness of breath.   amoxicillin-clavulanate (AUGMENTIN) 875-125 MG tablet Take 1 tablet by mouth 2 (two) times daily.   cetirizine (ZYRTEC) 10 MG tablet Take 1 tablet (10 mg total) by mouth daily.   colchicine 0.6 MG tablet Take 1 tablet (0.6 mg total) by mouth 2 (two) times daily.   Empagliflozin-metFORMIN HCl ER 12.12-998 MG TB24 Take 1 tablet by mouth daily.   ezetimibe (ZETIA) 10 MG tablet Take 10 mg by mouth daily.   fluticasone (FLONASE) 50 MCG/ACT nasal spray Place 2 sprays into both nostrils daily.   fluticasone-salmeterol (WIXELA INHUB) 250-50 MCG/ACT AEPB Inhale 1 puff into the lungs in the morning and at bedtime.   gemfibrozil (LOPID) 600 MG tablet Take 600 mg by mouth 2 (two) times daily before a meal.   losartan (COZAAR) 100 MG tablet Take 1 tablet (100 mg total) by mouth daily.   methocarbamol (ROBAXIN) 750 MG tablet Take 750 mg by mouth 3 (three) times daily.   metoprolol tartrate (LOPRESSOR) 25 MG tablet TAKE 1 TABLET BY MOUTH TWICE A DAY   Mirabegron (MYRBETRIQ PO) Take 25 tablets by mouth daily.   MOUNJARO 7.5 MG/0.5ML Pen Inject 7.5 mg into the skin once a week.   naproxen sodium (ANAPROX) 550 MG tablet Take 550 mg by mouth 2 (two) times daily.   Omega 3 1000 MG CAPS Take 2 capsules (2,000 mg total) by mouth daily.   omeprazole (PRILOSEC) 20 MG capsule Take 20 mg by mouth daily.   rosuvastatin (CRESTOR) 40 MG tablet Take 40 mg by mouth daily.   tadalafil (CIALIS) 10 MG tablet Take 10 mg by mouth daily as needed for erectile dysfunction.     Allergies:   Atorvastatin, Codeine, Niacin, Piroxicam, Pravastatin, Statins, and Zocor [simvastatin]   Social History   Socioeconomic History   Marital status: Widowed    Spouse name: Not on file   Number of children: Not on file   Years of education: Not  on file   Highest education level: Not on file  Occupational History   Not on file  Tobacco Use   Smoking status: Former    Current packs/day: 0.00    Average packs/day: 1 pack/day for 20.0 years (20.0 ttl pk-yrs)    Types: Cigarettes    Start date: 01/06/1947    Quit date: 01/06/1967    Years since quitting: 56.2   Smokeless tobacco: Never  Vaping Use   Vaping status: Never Used  Substance and Sexual Activity   Alcohol use: Yes    Comment: occassionally   Drug use: No   Sexual activity: Not on file  Other Topics Concern   Not on file  Social History Narrative   Not on file  Social Determinants of Health   Financial Resource Strain: Low Risk  (02/20/2023)   Overall Financial Resource Strain (CARDIA)    Difficulty of Paying Living Expenses: Not hard at all  Food Insecurity: No Food Insecurity (02/20/2023)   Hunger Vital Sign    Worried About Running Out of Food in the Last Year: Never true    Ran Out of Food in the Last Year: Never true  Transportation Needs: No Transportation Needs (02/20/2023)   PRAPARE - Administrator, Civil Service (Medical): No    Lack of Transportation (Non-Medical): No  Physical Activity: Sufficiently Active (02/20/2023)   Exercise Vital Sign    Days of Exercise per Week: 5 days    Minutes of Exercise per Session: 30 min  Stress: No Stress Concern Present (02/20/2023)   [employer] of Occupational Health - Occupational Stress Questionnaire    Feeling of Stress : Not at all  Social Connections: Moderately Isolated (02/20/2023)   Social Connection and Isolation Panel [NHANES]    Frequency of Communication with Friends and Family: Twice a week    Frequency of Social Gatherings with Friends and Family: Twice a week    Attends Religious Services: Never    Database administrator or Organizations: No    Attends Engineer, structural: Never    Marital Status: Married     Family History: The patient's family history includes  Alcoholism in his father; Brain cancer in his mother; CAD in his brother, brother, and father; Lung cancer in his brother. ROS:   Please see the history of present illness.    All 14 point review of systems negative except as described per history of present illness  EKGs/Labs/Other Studies Reviewed:         Recent Labs: 06/02/2022: Magnesium 2.1; NT-Pro BNP 37 10/05/2022: Hemoglobin 16.6; Platelets 211 10/10/2022: ALT 9; BUN 14; Creatinine, Ser 1.28; Potassium CANCELED; Sodium 143  Recent Lipid Panel    Component Value Date/Time   CHOL 74 (L) 10/10/2022 0837   TRIG 112 10/10/2022 0837   HDL 36 (L) 10/10/2022 0837   CHOLHDL 2.1 10/10/2022 0837   LDLCALC 17 10/10/2022 0837    Physical Exam:    VS:  BP 136/66 (BP Location: Left Arm, Patient Position: Sitting)   Pulse 90   Ht 5\' 11"  (1.803 m)   Wt 222 lb 9.6 oz (101 kg)   SpO2 94%   BMI 31.05 kg/m     Wt Readings from Last 3 Encounters:  03/07/23 222 lb 9.6 oz (101 kg)  02/20/23 227 lb (103 kg)  02/13/23 226 lb 3.2 oz (102.6 kg)     GEN:  Well nourished, well developed in no acute distress HEENT: Normal NECK: No JVD; No carotid bruits LYMPHATICS: No lymphadenopathy CARDIAC: RRR, no murmurs, no rubs, no gallops RESPIRATORY:  Clear to auscultation without rales, wheezing or rhonchi  ABDOMEN: Soft, non-tender, non-distended MUSCULOSKELETAL:  No edema; No deformity  SKIN: Warm and dry LOWER EXTREMITIES: no swelling NEUROLOGIC:  Alert and oriented x 3 PSYCHIATRIC:  Normal affect   ASSESSMENT:    1. Ventricular ectopy   2. Aortic atherosclerosis (HCC)   3. Diabetic nephropathy associated with type 2 diabetes mellitus (HCC)   4. Hypertension associated with diabetes (HCC)    PLAN:    In order of problems listed above:  Ventricular ectopy.  Asymptomatic.  Will schedule him to have another echocardiogram to make sure that ejection fraction did not get any worse.  His  ejection fraction previously was lower limits of  normal. Aortic atherosclerosis risk factors modification he is taking aspirin which I will continue, Dyslipidemia I did review K PN which show me his LDL of 17 HDL 36 this is from March of this year continue present management. Hypertension blood pressure well-controlled.   Medication Adjustments/Labs and Tests Ordered: Current medicines are reviewed at length with the patient today.  Concerns regarding medicines are outlined above.  No orders of the defined types were placed in this encounter.  Medication changes: No orders of the defined types were placed in this encounter.   Signed, Georgeanna Lea, MD, [facility] 03/07/2023 3:52 PM    [facility]

## 2023-03-12 ENCOUNTER — Other Ambulatory Visit: Payer: Self-pay | Admitting: Family Medicine

## 2023-03-12 DIAGNOSIS — J441 Chronic obstructive pulmonary disease with (acute) exacerbation: Secondary | ICD-10-CM

## 2023-04-26 ENCOUNTER — Encounter: Payer: Self-pay | Admitting: Family Medicine

## 2023-04-26 ENCOUNTER — Ambulatory Visit (INDEPENDENT_AMBULATORY_CARE_PROVIDER_SITE_OTHER): Payer: Medicare HMO | Admitting: Family Medicine

## 2023-04-26 VITALS — BP 136/78 | HR 71 | Temp 97.3°F | Ht 71.0 in | Wt 222.0 lb

## 2023-04-26 DIAGNOSIS — J441 Chronic obstructive pulmonary disease with (acute) exacerbation: Secondary | ICD-10-CM | POA: Diagnosis not present

## 2023-04-26 DIAGNOSIS — R051 Acute cough: Secondary | ICD-10-CM | POA: Insufficient documentation

## 2023-04-26 DIAGNOSIS — J029 Acute pharyngitis, unspecified: Secondary | ICD-10-CM | POA: Insufficient documentation

## 2023-04-26 LAB — POC COVID19 BINAXNOW: SARS Coronavirus 2 Ag: NEGATIVE

## 2023-04-26 LAB — POCT RAPID STREP A (OFFICE): Rapid Strep A Screen: NEGATIVE

## 2023-04-26 MED ORDER — PREDNISONE 20 MG PO TABS
ORAL_TABLET | ORAL | 0 refills | Status: AC
Start: 2023-04-26 — End: 2023-05-04

## 2023-04-26 MED ORDER — AMOXICILLIN-POT CLAVULANATE 875-125 MG PO TABS: 1.0000 | ORAL_TABLET | Freq: Two times a day (BID) | ORAL | 0 refills | Status: DC

## 2023-04-26 MED ORDER — TRIAMCINOLONE ACETONIDE 40 MG/ML IJ SUSP
80.0000 mg | Freq: Once | INTRAMUSCULAR | Status: AC
Start: 2023-04-26 — End: 2023-04-26
  Administered 2023-04-26: 80 mg via INTRAMUSCULAR

## 2023-04-26 MED ORDER — ALBUTEROL SULFATE HFA 108 (90 BASE) MCG/ACT IN AERS
2.0000 | INHALATION_SPRAY | Freq: Four times a day (QID) | RESPIRATORY_TRACT | 2 refills | Status: AC | PRN
Start: 2023-04-26 — End: ?

## 2023-04-26 NOTE — Assessment & Plan Note (Addendum)
Covid - negative Acute Try Robitussin or honey and lemon for cough as needed.

## 2023-04-26 NOTE — Assessment & Plan Note (Addendum)
Strep - negative Acute Try to gargle with warm salt water

## 2023-04-26 NOTE — Assessment & Plan Note (Addendum)
Acute  Kenalog 80 mg IM injection given in the office Prednisone taper: Take 3 pills daily in am with food x 3 days, then 2 pills daily in am with food x 3 days, then 1 pill daily in am with food x 3 days, then stop.  Augmentin 1 tablet TWICE A DAY for 10 days Albuterol inhaler 2 puffs THREE TIMES A DAY for 48 hours

## 2023-04-26 NOTE — Progress Notes (Signed)
Acute Office Visit  Subjective:    Patient ID: Christian Cavey., male    DOB: 1941/01/05, 82 y.o.   MRN: [id number]  Chief Complaint  Patient presents with   URI    HPI: Patient is in today for Upper respiratory symptoms He complains of congestion, non productive cough, and sore throat, runny nose with no fever, chills, night sweats or weight loss. Onset of symptoms was  2 days ago and worsening.He is drinking plenty of fluids.  Past history had pneumonia "many years ago". Takes generic zyrtec daily. Took mucinex last night prior to going to bed. Denies chest pain or shortness of breath. He has been using his Advair but has not used his albuterol yet. :   Past Medical History:  Diagnosis Date   Actinic keratosis 10/31/2020   Benign prostatic hyperplasia 10/31/2020   Mar 13, 2014 Entered By: Caprice Kluver B Comment: s/p TURP 2010   Body mass index (BMI) 33.0-33.9, adult 11/23/2020   BPPV (benign paroxysmal positional vertigo)    Calculus of kidney    Calculus of kidney    Cancer (HCC) 2015   skin cancer removed off left forearm    Cataract extraction status 10/31/2020   Apr 17, 2009 Entered By: Suzette Battiest Comment: bilateral   Class 2 severe obesity due to excess calories with serious comorbidity and body mass index (BMI) of 35.0 to 35.9 in adult (HCC) 06/10/2021   Closed fracture of body of sternum 11/10/2019   Contusion of right thigh 11/10/2019   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/2022   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   Encounter for fitting and adjustment of hearing aid 03/22/2021   Encounter for immunization 06/02/2022   Epidermoid cyst of skin 10/31/2020   Fatigue 11/23/2020   Gastroesophageal reflux disease 10/31/2020   GERD (gastroesophageal reflux disease)    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   Hemorrhage of gastrointestinal tract 10/31/2020   History of basal cell carcinoma (BCC)    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   Joint pain  11/23/2020   Long-term current use of testosterone replacement therapy 11/23/2020   Low libido 11/23/2020   Mixed hyperlipidemia    Myalgia due to statin 12/14/2021   Need for immunization against influenza 06/10/2021   Osteoarthritis    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   Pain in limb 09/01/2022   Polyp of colon 11/01/2020   Aug 31, 2015 Entered By: Caprice Kluver B Comment: colonoscopy 05/2015 - needs repeat ASAP   Prediabetes    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   Shortness of breath 09/01/2022   Sleep disturbance 11/23/2020   Status post craniotomy 02/10/2020   Stiffness of unspecified hand, not elsewhere classified 06/02/2022   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   Urge incontinence 11/23/2020   Urge incontinence of urine 11/23/2020   Vitamin D deficiency     Past Surgical History:  Procedure Laterality Date   CRANIOTOMY Left 02/10/2020   Procedure: CRANIOTOMY HEMATOMA EVACUATION SUBDURAL;  Surgeon: Julio Sicks, MD;  Location: [facility] OR;  Service: Neurosurgery;  Laterality: Left;   EYE SURGERY Bilateral 2012   cataracts w lens implants   PROSTATECTOMY  2008   BPH   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   TEMPORAL ARTERY BIOPSY / LIGATION Bilateral 1997   negative   TRANSURETHRAL RESECTION OF PROSTATE  2010    Family History  Problem Relation Age of Onset   Brain cancer Mother    CAD Father  Alcoholism Father    CAD Brother    Lung cancer Brother    CAD Brother     Social History   Socioeconomic History   Marital status: Widowed    Spouse name: Not on file   Number of children: Not on file   Years of education: Not on file   Highest education level: Not on file  Occupational History   Not on file  Tobacco Use   Smoking status: Former    Current packs/day: 0.00    Average packs/day: 1 pack/day for 20.0 years (20.0 ttl pk-yrs)    Types: Cigarettes    Start date: 01/06/1947    Quit date: 01/06/1967    Years since  quitting: 56.3   Smokeless tobacco: Never  Vaping Use   Vaping status: Never Used  Substance and Sexual Activity   Alcohol use: Yes    Comment: occassionally   Drug use: No   Sexual activity: Not on file  Other Topics Concern   Not on file  Social History Narrative   Not on file   Social Determinants of Health   Financial Resource Strain: Low Risk  (02/20/2023)   Overall Financial Resource Strain (CARDIA)    Difficulty of Paying Living Expenses: Not hard at all  Food Insecurity: No Food Insecurity (02/20/2023)   Hunger Vital Sign    Worried About Running Out of Food in the Last Year: Never true    Ran Out of Food in the Last Year: Never true  Transportation Needs: No Transportation Needs (02/20/2023)   PRAPARE - Administrator, Civil Service (Medical): No    Lack of Transportation (Non-Medical): No  Physical Activity: Sufficiently Active (02/20/2023)   Exercise Vital Sign    Days of Exercise per Week: 5 days    Minutes of Exercise per Session: 30 min  Stress: No Stress Concern Present (02/20/2023)   [employer] of Occupational Health - Occupational Stress Questionnaire    Feeling of Stress : Not at all  Social Connections: Moderately Isolated (02/20/2023)   Social Connection and Isolation Panel [NHANES]    Frequency of Communication with Friends and Family: Twice a week    Frequency of Social Gatherings with Friends and Family: Twice a week    Attends Religious Services: Never    Database administrator or Organizations: No    Attends Banker Meetings: Never    Marital Status: Married  Catering manager Violence: Not At Risk (02/20/2023)   Humiliation, Afraid, Rape, and Kick questionnaire    Fear of Current or Ex-Partner: No    Emotionally Abused: No    Physically Abused: No    Sexually Abused: No    Outpatient Medications Prior to Visit  Medication Sig Dispense Refill   albuterol (VENTOLIN HFA) 108 (90 Base) MCG/ACT inhaler TAKE 2 PUFFS BY  MOUTH EVERY 6 HOURS AS NEEDED FOR WHEEZE OR SHORTNESS OF BREATH 18 each 2   cetirizine (ZYRTEC) 10 MG tablet Take 1 tablet (10 mg total) by mouth daily. 100 tablet 0   colchicine 0.6 MG tablet Take 1 tablet (0.6 mg total) by mouth 2 (two) times daily. 14 tablet 1   Empagliflozin-metFORMIN HCl ER 12.12-998 MG TB24 Take 1 tablet by mouth daily.     ezetimibe (ZETIA) 10 MG tablet Take 10 mg by mouth daily.     fluticasone (FLONASE) 50 MCG/ACT nasal spray Place 2 sprays into both nostrils daily. 16 g 6   fluticasone-salmeterol (WIXELA INHUB) 250-50 MCG/ACT  AEPB Inhale 1 puff into the lungs in the morning and at bedtime. 3 each 1   gemfibrozil (LOPID) 600 MG tablet Take 600 mg by mouth 2 (two) times daily before a meal.     losartan (COZAAR) 100 MG tablet Take 1 tablet (100 mg total) by mouth daily. 90 tablet 1   methocarbamol (ROBAXIN) 750 MG tablet Take 750 mg by mouth 3 (three) times daily.     metoprolol tartrate (LOPRESSOR) 25 MG tablet TAKE 1 TABLET BY MOUTH TWICE A DAY 180 tablet 2   Mirabegron (MYRBETRIQ PO) Take 25 tablets by mouth daily.     MOUNJARO 7.5 MG/0.5ML Pen Inject 7.5 mg into the skin once a week.     naproxen sodium (ANAPROX) 550 MG tablet Take 550 mg by mouth 2 (two) times daily.     Omega 3 1000 MG CAPS Take 2 capsules (2,000 mg total) by mouth daily. 180 capsule 1   omeprazole (PRILOSEC) 20 MG capsule Take 20 mg by mouth daily.     rosuvastatin (CRESTOR) 40 MG tablet Take 40 mg by mouth daily.     tadalafil (CIALIS) 10 MG tablet Take 10 mg by mouth daily as needed for erectile dysfunction.     amoxicillin-clavulanate (AUGMENTIN) 875-125 MG tablet Take 1 tablet by mouth 2 (two) times daily. 20 tablet 0   No facility-administered medications prior to visit.    Allergies  Allergen Reactions   Atorvastatin     Other reaction(s): Muscle pain   Codeine Other (See Comments)   Niacin Itching    Other reaction(s): Itching, Flushing   Piroxicam     Other reaction(s): Gastric  ulcer with hemorrhage   Pravastatin     Other reaction(s): Muscle pain   Statins     myalgias   Zocor [Simvastatin]     Other reaction(s): Muscle pain    Review of Systems  Constitutional:  Negative for chills, diaphoresis, fatigue and fever.  HENT:  Positive for congestion, rhinorrhea and sore throat. Negative for ear pain.   Respiratory:  Positive for cough. Negative for shortness of breath.   Cardiovascular:  Negative for chest pain and leg swelling.       Objective:        04/26/2023   11:11 AM 03/07/2023    3:38 PM 02/20/2023   10:38 AM  Vitals with BMI  Height 5\' 11"  5\' 11"  5\' 11"   Weight 222 lbs 222 lbs 10 oz 227 lbs  BMI 30.98 31.06 31.67  Systolic 136 136 161  Diastolic 78 66 72  Pulse 71 90 63    No data found.   Physical Exam Constitutional:      General: He is not in acute distress.    Appearance: He is ill-appearing.  HENT:     Right Ear: Tympanic membrane normal. No tenderness.     Left Ear: Tympanic membrane normal. No tenderness.     Nose: Congestion present.     Right Turbinates: Swollen.     Left Turbinates: Swollen.     Mouth/Throat:     Pharynx: Pharyngeal swelling and posterior oropharyngeal erythema present.  Cardiovascular:     Rate and Rhythm: Normal rate and regular rhythm.     Heart sounds: Normal heart sounds.  Pulmonary:     Breath sounds: Decreased breath sounds and wheezing present.  Skin:    General: Skin is warm.  Neurological:     Mental Status: He is alert.  Psychiatric:  Mood and Affect: Mood normal.     Health Maintenance Due  Topic Date Due   OPHTHALMOLOGY EXAM  Never done   INFLUENZA VACCINE  03/08/2023   COVID-19 Vaccine (5 - 2023-24 season) 04/08/2023   HEMOGLOBIN A1C  04/12/2023    There are no preventive care reminders to display for this patient.   Lab Results  Component Value Date   TSH 3.800 09/09/2021   Lab Results  Component Value Date   WBC 7.9 10/05/2022   HGB 16.6 10/05/2022   HCT  50.3 10/05/2022   MCV 85 10/05/2022   PLT 211 10/05/2022   Lab Results  Component Value Date   NA 143 10/10/2022   K CANCELED 10/10/2022   CO2 15 (L) 10/10/2022   GLUCOSE 87 10/10/2022   BUN 14 10/10/2022   CREATININE 1.28 (H) 10/10/2022   BILITOT 0.6 10/10/2022   ALKPHOS 52 10/10/2022   AST 26 10/10/2022   ALT 9 10/10/2022   PROT 6.9 10/10/2022   ALBUMIN 4.3 10/10/2022   CALCIUM 9.5 10/10/2022   ANIONGAP 11 02/11/2020   EGFR 58.0 02/08/2023   Lab Results  Component Value Date   CHOL 74 (L) 10/10/2022   Lab Results  Component Value Date   HDL 36 (L) 10/10/2022   Lab Results  Component Value Date   LDLCALC 17 10/10/2022   Lab Results  Component Value Date   TRIG 112 10/10/2022   Lab Results  Component Value Date   CHOLHDL 2.1 10/10/2022   Lab Results  Component Value Date   HGBA1C 5.5 10/10/2022       Assessment & Plan:  COPD exacerbation (HCC) Assessment & Plan: Acute  Kenalog 80 mg IM injection given in the office Prednisone taper: Take 3 pills daily in am with food x 3 days, then 2 pills daily in am with food x 3 days, then 1 pill daily in am with food x 3 days, then stop.  Augmentin 1 tablet TWICE A DAY for 10 days Albuterol inhaler 2 puffs THREE TIMES A DAY for 48 hours  Orders: -     Amoxicillin-Pot Clavulanate; Take 1 tablet by mouth 2 (two) times daily.  Dispense: 20 tablet; Refill: 0 -     predniSONE; Take 3 tablets (60 mg total) by mouth daily with breakfast for 3 days, THEN 2 tablets (40 mg total) daily with breakfast for 3 days, THEN 1 tablet (20 mg total) daily with breakfast for 3 days.  Dispense: 18 tablet; Refill: 0 -     Albuterol Sulfate HFA; Inhale 2 puffs into the lungs every 6 (six) hours as needed for wheezing or shortness of breath.  Dispense: 8 g; Refill: 2 -     Triamcinolone Acetonide  Acute cough Assessment & Plan: Covid - negative Acute Try Robitussin or honey and lemon for cough as needed.  Orders: -     POC COVID-19  BinaxNow  Sore throat Assessment & Plan: Strep - negative Acute Try to gargle with warm salt water   Orders: -     POCT rapid strep A     Meds ordered this encounter  Medications   amoxicillin-clavulanate (AUGMENTIN) 875-125 MG tablet    Sig: Take 1 tablet by mouth 2 (two) times daily.    Dispense:  20 tablet    Refill:  0   predniSONE (DELTASONE) 20 MG tablet    Sig: Take 3 tablets (60 mg total) by mouth daily with breakfast for 3 days, THEN 2 tablets (40  mg total) daily with breakfast for 3 days, THEN 1 tablet (20 mg total) daily with breakfast for 3 days.    Dispense:  18 tablet    Refill:  0   albuterol (VENTOLIN HFA) 108 (90 Base) MCG/ACT inhaler    Sig: Inhale 2 puffs into the lungs every 6 (six) hours as needed for wheezing or shortness of breath.    Dispense:  8 g    Refill:  2   triamcinolone acetonide (KENALOG-40) injection 80 mg    Orders Placed This Encounter  Procedures   POC COVID-19   Rapid Strep A     Follow-up: Return if symptoms worsen or fail to improve.  An After Visit Summary was printed and given to the patient.  Total time spent on today's visit was greater than 30 minutes, including both face-to-face time and nonface-to-face time personally spent on review of chart (labs and imaging), discussing labs and goals, discussing further work-up, treatment options, referrals to specialist if needed, reviewing outside records if pertinent, answering patient's questions, and coordinating care.   Renne Crigler, FNP [facility] [phone]

## 2023-05-11 DIAGNOSIS — H35361 Drusen (degenerative) of macula, right eye: Secondary | ICD-10-CM | POA: Diagnosis not present

## 2023-05-11 DIAGNOSIS — Z961 Presence of intraocular lens: Secondary | ICD-10-CM | POA: Diagnosis not present

## 2023-05-11 DIAGNOSIS — H18513 Endothelial corneal dystrophy, bilateral: Secondary | ICD-10-CM | POA: Diagnosis not present

## 2023-05-11 DIAGNOSIS — Z0100 Encounter for examination of eyes and vision without abnormal findings: Secondary | ICD-10-CM | POA: Diagnosis not present

## 2023-05-11 LAB — OPHTHALMOLOGY REPORT-SCANNED

## 2023-05-11 LAB — HM DIABETES EYE EXAM

## 2023-05-21 NOTE — Progress Notes (Unsigned)
Subjective:  Patient ID: Christian Barge., male    DOB: 1941/07/31  Age: 82 y.o. MRN: [id number]  Chief Complaint  Patient presents with   Medical Management of Chronic Issues    HPI Diabetes:  Not checking sugar. On synjardy  ER 12.12/998 mg once daily. Eye exam annually.  Gave up sweet teat and avoids sweets.   Hyperlipidemia/Aortic Atherosclerosis: Current medications: Tolerating crestor 40 mg 1/2 pill daily, zetia 10 mg before bed, and Gemfibrozil 600 mg twice a day. Ran out of fish oil otc recently.   Hypertension: Current medications: Losartan 100 mg once daily, metoprolol tartrate 25 mg daily.    COPD/Asthma: Current medications: on wixela 1 puff twice daily. No dyspnea. Albuterol as needed.  GERD: on omeprazole 20 mg daily.   BPH:   myrbetriq daily.       05/22/2023    7:41 AM 02/20/2023   10:45 AM 01/29/2023    7:35 AM 10/05/2022    3:05 PM 12/29/2021    2:47 PM  Depression screen PHQ 2/9  Decreased Interest 0 0 0 0 0  Down, Depressed, Hopeless 0 0 0 0 0  PHQ - 2 Score 0 0 0 0 0  Altered sleeping 0 0 0    Tired, decreased energy 0 1 1    Change in appetite 0 0 0    Feeling bad or failure about yourself  0 0 0    Trouble concentrating 0 0 0    Moving slowly or fidgety/restless 0 0 0    Suicidal thoughts 0 0 0    PHQ-9 Score 0 1 1    Difficult doing work/chores Not difficult at all Not difficult at all Not difficult at all          05/22/2023    7:41 AM  Fall Risk   Falls in the past year? 1  Number falls in past yr: 0  Injury with Fall? 0  Risk for fall due to : History of fall(s)  Follow up Falls evaluation completed;Follow up appointment    Patient Care Team: Blane Ohara, MD as PCP - General (Family Medicine) Zettie Pho, [facility] (Inactive) as Pharmacist (Pharmacist) [facility], [place]   Review of Systems  Constitutional:  Negative for chills, fatigue and fever.  HENT:  Negative for congestion, ear pain and sore throat.    Respiratory:  Negative for cough and shortness of breath.   Cardiovascular:  Negative for chest pain.  Gastrointestinal:  Negative for abdominal pain, constipation, diarrhea, nausea and vomiting.  Endocrine: Negative for polydipsia, polyphagia and polyuria.  Genitourinary:  Negative for dysuria and frequency.  Musculoskeletal:  Negative for arthralgias and myalgias.  Neurological:  Negative for dizziness and headaches.  Psychiatric/Behavioral:  Negative for dysphoric mood.        No dysphoria    Current Outpatient Medications on File Prior to Visit  Medication Sig Dispense Refill   albuterol (VENTOLIN HFA) 108 (90 Base) MCG/ACT inhaler Inhale 2 puffs into the lungs every 6 (six) hours as needed for wheezing or shortness of breath. 8 g 2   cetirizine (ZYRTEC) 10 MG tablet Take 1 tablet (10 mg total) by mouth daily. 100 tablet 0   Empagliflozin-metFORMIN HCl ER 12.12-998 MG TB24 Take 1 tablet by mouth daily.     ezetimibe (ZETIA) 10 MG tablet Take 10 mg by mouth daily.     fluticasone (FLONASE) 50 MCG/ACT nasal spray Place 2 sprays into both nostrils daily. 16 g  6   fluticasone-salmeterol (WIXELA INHUB) 250-50 MCG/ACT AEPB Inhale 1 puff into the lungs in the morning and at bedtime. 3 each 1   gemfibrozil (LOPID) 600 MG tablet Take 600 mg by mouth 2 (two) times daily before a meal.     losartan (COZAAR) 100 MG tablet Take 1 tablet (100 mg total) by mouth daily. 90 tablet 1   metoprolol tartrate (LOPRESSOR) 25 MG tablet TAKE 1 TABLET BY MOUTH TWICE A DAY 180 tablet 2   Mirabegron (MYRBETRIQ PO) Take 25 tablets by mouth daily.     naproxen sodium (ANAPROX) 550 MG tablet Take 550 mg by mouth 2 (two) times daily.     omeprazole (PRILOSEC) 20 MG capsule Take 20 mg by mouth daily.     rosuvastatin (CRESTOR) 40 MG tablet Take 40 mg by mouth daily.     tadalafil (CIALIS) 10 MG tablet Take 10 mg by mouth daily as needed for erectile dysfunction.     Omega 3 1000 MG CAPS Take 2 capsules (2,000 mg  total) by mouth daily. (Patient not taking: Reported on 05/22/2023) 180 capsule 1   No current facility-administered medications on file prior to visit.   Past Medical History:  Diagnosis Date   Actinic keratosis 10/31/2020   Benign prostatic hyperplasia 10/31/2020   Mar 13, 2014 Entered By: Caprice Kluver B Comment: s/p TURP 2010   Body mass index (BMI) 33.0-33.9, adult 11/23/2020   BPPV (benign paroxysmal positional vertigo)    Calculus of kidney    Calculus of kidney    Cancer (HCC) 2015   skin cancer removed off left forearm    Cataract extraction status 10/31/2020   Apr 17, 2009 Entered By: Suzette Battiest Comment: bilateral   Class 2 severe obesity due to excess calories with serious comorbidity and body mass index (BMI) of 35.0 to 35.9 in adult (HCC) 06/10/2021   Closed fracture of body of sternum 11/10/2019   Contusion of right thigh 11/10/2019   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/2022   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   Encounter for fitting and adjustment of hearing aid 03/22/2021   Encounter for immunization 06/02/2022   Epidermoid cyst of skin 10/31/2020   Fatigue 11/23/2020   Gastroesophageal reflux disease 10/31/2020   GERD (gastroesophageal reflux disease)    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   Hemorrhage of gastrointestinal tract 10/31/2020   History of basal cell carcinoma (BCC)    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   Joint pain 11/23/2020   Long-term current use of testosterone replacement therapy 11/23/2020   Low libido 11/23/2020   Mixed hyperlipidemia    Myalgia due to statin 12/14/2021   Need for immunization against influenza 06/10/2021   Osteoarthritis    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   Pain in limb 09/01/2022   Polyp of colon 11/01/2020   Aug 31, 2015 Entered By: Caprice Kluver B Comment: colonoscopy 05/2015 - needs repeat ASAP   Prediabetes    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   Shortness of breath 09/01/2022   Sleep  disturbance 11/23/2020   Status post craniotomy 02/10/2020   Stiffness of unspecified hand, not elsewhere classified 06/02/2022   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   Urge incontinence 11/23/2020   Urge incontinence of urine 11/23/2020   Vitamin D deficiency    Past Surgical History:  Procedure Laterality Date   CRANIOTOMY Left 02/10/2020   Procedure: CRANIOTOMY HEMATOMA EVACUATION SUBDURAL;  Surgeon: Julio Sicks, MD;  Location: [facility] OR;  Service:  Neurosurgery;  Laterality: Left;   EYE SURGERY Bilateral 2012   cataracts w lens implants   PROSTATECTOMY  2008   BPH   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   TEMPORAL ARTERY BIOPSY / LIGATION Bilateral 1997   negative   TRANSURETHRAL RESECTION OF PROSTATE  2010    Family History  Problem Relation Age of Onset   Brain cancer Mother    CAD Father    Alcoholism Father    CAD Brother    Lung cancer Brother    CAD Brother    Social History   Socioeconomic History   Marital status: Widowed    Spouse name: Not on file   Number of children: Not on file   Years of education: Not on file   Highest education level: Not on file  Occupational History   Not on file  Tobacco Use   Smoking status: Former    Current packs/day: 0.00    Average packs/day: 1 pack/day for 20.0 years (20.0 ttl pk-yrs)    Types: Cigarettes    Start date: 01/06/1947    Quit date: 01/06/1967    Years since quitting: 56.4   Smokeless tobacco: Never  Vaping Use   Vaping status: Never Used  Substance and Sexual Activity   Alcohol use: Yes    Comment: occassionally   Drug use: No   Sexual activity: Not on file  Other Topics Concern   Not on file  Social History Narrative   Not on file   Social Determinants of Health   Financial Resource Strain: Low Risk  (02/20/2023)   Overall Financial Resource Strain (CARDIA)    Difficulty of Paying Living Expenses: Not hard at all  Food Insecurity: No Food Insecurity (02/20/2023)    Hunger Vital Sign    Worried About Running Out of Food in the Last Year: Never true    Ran Out of Food in the Last Year: Never true  Transportation Needs: No Transportation Needs (02/20/2023)   PRAPARE - Administrator, Civil Service (Medical): No    Lack of Transportation (Non-Medical): No  Physical Activity: Sufficiently Active (02/20/2023)   Exercise Vital Sign    Days of Exercise per Week: 5 days    Minutes of Exercise per Session: 30 min  Stress: No Stress Concern Present (02/20/2023)   [employer] of Occupational Health - Occupational Stress Questionnaire    Feeling of Stress : Not at all  Social Connections: Moderately Isolated (02/20/2023)   Social Connection and Isolation Panel [NHANES]    Frequency of Communication with Friends and Family: Twice a week    Frequency of Social Gatherings with Friends and Family: Twice a week    Attends Religious Services: Never    Diplomatic Services operational officer: No    Attends Engineer, structural: Never    Marital Status: Married    Objective:  BP 120/60   Pulse 60   Temp (!) 97.1 F (36.2 C)   Resp 14   Ht 5\' 11"  (1.803 m)   Wt 222 lb 9.6 oz (101 kg)   BMI 31.05 kg/m      05/22/2023    7:36 AM 04/26/2023   11:11 AM 03/07/2023    3:38 PM  BP/Weight  Systolic BP 120 136 136  Diastolic BP 60 78 66  Wt. (Lbs) 222.6 222 222.6  BMI 31.05 kg/m2 30.96 kg/m2 31.05 kg/m2    Physical Exam Vitals reviewed.  Constitutional:  Appearance: Normal appearance. He is obese.  Neck:     Vascular: No carotid bruit.  Cardiovascular:     Rate and Rhythm: Normal rate and regular rhythm.     Heart sounds: Normal heart sounds.  Pulmonary:     Effort: Pulmonary effort is normal.     Breath sounds: Normal breath sounds. No wheezing, rhonchi or rales.  Abdominal:     General: Bowel sounds are normal.     Palpations: Abdomen is soft.     Tenderness: There is no abdominal tenderness.  Neurological:     Mental  Status: He is alert and oriented to person, place, and time.  Psychiatric:        Mood and Affect: Mood normal.        Behavior: Behavior normal.     Diabetic Foot Exam - Simple   Simple Foot Form  05/22/2023  8:10 AM  Visual Inspection No deformities, no ulcerations, no other skin breakdown bilaterally: Yes Sensation Testing Intact to touch and monofilament testing bilaterally: Yes Pulse Check See comments: Yes Comments Dorsalis pedis not palpable BL.       Lab Results  Component Value Date   WBC 7.9 10/05/2022   HGB 16.6 10/05/2022   HCT 50.3 10/05/2022   PLT 211 10/05/2022   GLUCOSE 87 10/10/2022   CHOL 74 (L) 10/10/2022   TRIG 112 10/10/2022   HDL 36 (L) 10/10/2022   LDLCALC 17 10/10/2022   ALT 9 10/10/2022   AST 26 10/10/2022   NA 143 10/10/2022   K CANCELED 10/10/2022   CL 107 (H) 10/10/2022   CREATININE 1.28 (H) 10/10/2022   BUN 14 10/10/2022   CO2 15 (L) 10/10/2022   TSH 3.800 09/09/2021   INR 1.1 02/10/2020   HGBA1C 5.5 10/10/2022      Assessment & Plan:    Simple chronic bronchitis (HCC) Assessment & Plan: The current medical regimen is effective;  continue present plan and medications.  Wixela, albuterol.   GERD without esophagitis Assessment & Plan: The current medical regimen is effective;  continue present plan and medications. omeprazole 20 mg daily.    Diabetic nephropathy associated with type 2 diabetes mellitus (HCC) Assessment & Plan: Control: Controlled Recommend check feet daily. Recommend annual eye exams. Medicines:  synjardy 12.12/998 mg once daily. Continue to work on eating a healthy diet and exercise.    Orders: -     Hemoglobin A1c  Mixed hyperlipidemia Assessment & Plan: Well controlled.  No changes to medicines. Continue crestor 20 mg daily, zetia 10 mg daily,  fish oil 1000 mg twice daily and lopid 600 mg twice daily.   Continue to work on eating a healthy diet and exercise.     Orders: -     Lipid  panel  Encounter for immunization -     Flu Vaccine Trivalent High Dose (Fluad)  Decreased pulses in feet -     US ARTERIAL ABI (SCREENING LOWER EXTREMITY); Future  Essential hypertension, benign Assessment & Plan: Well controlled.  No changes to medicines. Continue Losartan 100 mg once daily, Metoprolol 25 mg twice a day. Continue to work on eating a healthy diet and exercise.    Orders: -     CBC with Differential/Platelet -     Comprehensive metabolic panel  Class 1 obesity due to excess calories with serious comorbidity and body mass index (BMI) of 31.0 to 31.9 in adult Assessment & Plan: Recommend continue to work on eating healthy diet and exercise.  No orders of the defined types were placed in this encounter.   Orders Placed This Encounter  Procedures   US ARTERIAL ABI (SCREENING LOWER EXTREMITY)   Flu Vaccine Trivalent High Dose (Fluad)   CBC with Differential/Platelet   Comprehensive metabolic panel   Hemoglobin A1c   Lipid panel     Follow-up: Return in about 4 months (around 09/22/2023) for chronic fasting.   I,Marla I Leal-Borjas,acting as a scribe for Blane Ohara, MD.,have documented all relevant documentation on the behalf of Blane Ohara, MD,as directed by  Blane Ohara, MD while in the presence of Blane Ohara, MD.   An After Visit Summary was printed and given to the patient.  Blane Ohara, MD [facility] [phone]

## 2023-05-21 NOTE — Assessment & Plan Note (Signed)
Control: Controlled Recommend check sugars fasting daily. Recommend check feet daily. Recommend annual eye exams. Medicines:  synjardy 12.12/998 mg once daily, Mounjaro 7.5 mg weekly. Continue to work on eating a healthy diet and exercise.

## 2023-05-21 NOTE — Assessment & Plan Note (Signed)
Well controlled.  No changes to medicines. Continue Losartan 100 mg once daily, Metoprolol 25 mg twice a day. Continue to work on eating a healthy diet and exercise.

## 2023-05-21 NOTE — Assessment & Plan Note (Signed)
Well controlled.  No changes to medicines. Continue crestor 20 mg daily, zetia 10 mg daily,  fish oil 1000 mg twice daily and lopid 600 mg twice daily.   Continue to work on eating a healthy diet and exercise.

## 2023-05-21 NOTE — Assessment & Plan Note (Signed)
The current medical regimen is effective;  continue present plan and medications. omeprazole 20 mg daily.

## 2023-05-21 NOTE — Assessment & Plan Note (Signed)
The current medical regimen is effective;  continue present plan and medications.  Wixela, albuterol.

## 2023-05-22 ENCOUNTER — Encounter: Payer: Self-pay | Admitting: Family Medicine

## 2023-05-22 ENCOUNTER — Ambulatory Visit (INDEPENDENT_AMBULATORY_CARE_PROVIDER_SITE_OTHER): Payer: Medicare HMO | Admitting: Family Medicine

## 2023-05-22 VITALS — BP 120/60 | HR 60 | Temp 97.1°F | Resp 14 | Ht 71.0 in | Wt 222.6 lb

## 2023-05-22 DIAGNOSIS — I10 Essential (primary) hypertension: Secondary | ICD-10-CM | POA: Diagnosis not present

## 2023-05-22 DIAGNOSIS — R0989 Other specified symptoms and signs involving the circulatory and respiratory systems: Secondary | ICD-10-CM

## 2023-05-22 DIAGNOSIS — K219 Gastro-esophageal reflux disease without esophagitis: Secondary | ICD-10-CM | POA: Diagnosis not present

## 2023-05-22 DIAGNOSIS — Z6831 Body mass index (BMI) 31.0-31.9, adult: Secondary | ICD-10-CM | POA: Diagnosis not present

## 2023-05-22 DIAGNOSIS — Z23 Encounter for immunization: Secondary | ICD-10-CM | POA: Insufficient documentation

## 2023-05-22 DIAGNOSIS — E6609 Other obesity due to excess calories: Secondary | ICD-10-CM | POA: Diagnosis not present

## 2023-05-22 DIAGNOSIS — J410 Simple chronic bronchitis: Secondary | ICD-10-CM

## 2023-05-22 DIAGNOSIS — E66811 Obesity, class 1: Secondary | ICD-10-CM | POA: Insufficient documentation

## 2023-05-22 DIAGNOSIS — I152 Hypertension secondary to endocrine disorders: Secondary | ICD-10-CM | POA: Diagnosis not present

## 2023-05-22 DIAGNOSIS — E1159 Type 2 diabetes mellitus with other circulatory complications: Secondary | ICD-10-CM | POA: Diagnosis not present

## 2023-05-22 DIAGNOSIS — J441 Chronic obstructive pulmonary disease with (acute) exacerbation: Secondary | ICD-10-CM

## 2023-05-22 DIAGNOSIS — I700 Atherosclerosis of aorta: Secondary | ICD-10-CM | POA: Diagnosis not present

## 2023-05-22 DIAGNOSIS — E1121 Type 2 diabetes mellitus with diabetic nephropathy: Secondary | ICD-10-CM | POA: Diagnosis not present

## 2023-05-22 DIAGNOSIS — E782 Mixed hyperlipidemia: Secondary | ICD-10-CM | POA: Diagnosis not present

## 2023-05-22 HISTORY — DX: Other specified symptoms and signs involving the circulatory and respiratory systems: R09.89

## 2023-05-22 LAB — CBC WITH DIFFERENTIAL/PLATELET
Basophils Absolute: 0.1 10*3/uL (ref 0.0–0.2)
Basos: 1 %
EOS (ABSOLUTE): 0.3 10*3/uL (ref 0.0–0.4)
Eos: 7 %
Hematocrit: 50.6 % (ref 37.5–51.0)
Hemoglobin: 15.7 g/dL (ref 13.0–17.7)
Immature Grans (Abs): 0 10*3/uL (ref 0.0–0.1)
Immature Granulocytes: 0 %
Lymphocytes Absolute: 1.1 10*3/uL (ref 0.7–3.1)
Lymphs: 23 %
MCH: 25.9 pg — ABNORMAL LOW (ref 26.6–33.0)
MCHC: 31 g/dL — ABNORMAL LOW (ref 31.5–35.7)
MCV: 84 fL (ref 79–97)
Monocytes Absolute: 0.6 10*3/uL (ref 0.1–0.9)
Monocytes: 12 %
Neutrophils Absolute: 2.8 10*3/uL (ref 1.4–7.0)
Neutrophils: 57 %
Platelets: 233 10*3/uL (ref 150–450)
RBC: 6.06 x10E6/uL — ABNORMAL HIGH (ref 4.14–5.80)
RDW: 14.3 % (ref 11.6–15.4)
WBC: 4.8 10*3/uL (ref 3.4–10.8)

## 2023-05-22 LAB — COMPREHENSIVE METABOLIC PANEL
ALT: 12 [IU]/L (ref 0–44)
AST: 15 [IU]/L (ref 0–40)
Albumin: 4.2 g/dL (ref 3.7–4.7)
Alkaline Phosphatase: 47 [IU]/L (ref 44–121)
BUN/Creatinine Ratio: 13 (ref 10–24)
BUN: 16 mg/dL (ref 8–27)
Bilirubin Total: 0.7 mg/dL (ref 0.0–1.2)
CO2: 22 mmol/L (ref 20–29)
Calcium: 9.6 mg/dL (ref 8.6–10.2)
Chloride: 104 mmol/L (ref 96–106)
Creatinine, Ser: 1.28 mg/dL — ABNORMAL HIGH (ref 0.76–1.27)
Globulin, Total: 2.2 g/dL (ref 1.5–4.5)
Glucose: 102 mg/dL — ABNORMAL HIGH (ref 70–99)
Potassium: 4.9 mmol/L (ref 3.5–5.2)
Sodium: 141 mmol/L (ref 134–144)
Total Protein: 6.4 g/dL (ref 6.0–8.5)
eGFR: 56 mL/min/{1.73_m2} — ABNORMAL LOW (ref >59)

## 2023-05-22 LAB — LIPID PANEL
Chol/HDL Ratio: 3.6 {ratio} (ref 0.0–5.0)
Cholesterol, Total: 176 mg/dL (ref 100–199)
HDL: 49 mg/dL (ref >39)
LDL Chol Calc (NIH): 106 mg/dL — ABNORMAL HIGH (ref 0–99)
Triglycerides: 118 mg/dL (ref 0–149)
VLDL Cholesterol Cal: 21 mg/dL (ref 5–40)

## 2023-05-22 LAB — HEMOGLOBIN A1C
Est. average glucose Bld gHb Est-mCnc: 126 mg/dL
Hgb A1c MFr Bld: 6 % — ABNORMAL HIGH (ref 4.8–5.6)

## 2023-05-22 NOTE — Assessment & Plan Note (Signed)
Recommend continue to work on eating healthy diet and exercise.  

## 2023-05-25 ENCOUNTER — Ambulatory Visit
Admission: RE | Admit: 2023-05-25 | Discharge: 2023-05-25 | Disposition: A | Discharge status: Home / Self Care | Payer: Medicare HMO | Source: Ambulatory Visit | Attending: Family Medicine | Admitting: Family Medicine

## 2023-05-25 ENCOUNTER — Other Ambulatory Visit: Payer: Self-pay | Admitting: Family Medicine

## 2023-05-25 DIAGNOSIS — Z23 Encounter for immunization: Secondary | ICD-10-CM

## 2023-05-25 DIAGNOSIS — E66811 Obesity, class 1: Secondary | ICD-10-CM

## 2023-05-25 DIAGNOSIS — R0989 Other specified symptoms and signs involving the circulatory and respiratory systems: Secondary | ICD-10-CM | POA: Diagnosis not present

## 2023-05-25 DIAGNOSIS — J410 Simple chronic bronchitis: Secondary | ICD-10-CM

## 2023-05-25 DIAGNOSIS — K219 Gastro-esophageal reflux disease without esophagitis: Secondary | ICD-10-CM

## 2023-05-25 DIAGNOSIS — I10 Essential (primary) hypertension: Secondary | ICD-10-CM

## 2023-05-25 DIAGNOSIS — E782 Mixed hyperlipidemia: Secondary | ICD-10-CM

## 2023-05-25 DIAGNOSIS — E1121 Type 2 diabetes mellitus with diabetic nephropathy: Secondary | ICD-10-CM

## 2023-06-19 ENCOUNTER — Ambulatory Visit: Payer: Medicare HMO | Attending: Cardiology

## 2023-06-19 DIAGNOSIS — R0609 Other forms of dyspnea: Secondary | ICD-10-CM

## 2023-06-19 LAB — ECHOCARDIOGRAM COMPLETE
AR max vel: 1.37 cm2
AV Area VTI: 1.53 cm2
AV Area mean vel: 1.44 cm2
AV Mean grad: 12 mm[Hg]
AV Peak grad: 21.9 mm[Hg]
Ao pk vel: 2.34 m/s
Area-P 1/2: 3.31 cm2
S' Lateral: 3.3 cm

## 2023-06-27 ENCOUNTER — Telehealth: Payer: Self-pay

## 2023-06-27 NOTE — Telephone Encounter (Signed)
LVM per DPR- per Dr. Krasowski's note regarding normal Echo results. Encouraged to call with any questions. Routed to PCP. 

## 2023-09-05 NOTE — Progress Notes (Signed)
Subjective:  Patient ID: Christian Lowe., male    DOB: September 23, 1940  Age: 83 y.o. MRN: [id number]  Chief Complaint  Patient presents with   Medical Management of Chronic Issues    HPI Diabetes:  Not checking sugar. On synjardy ER 12.12/998 mg once daily. Eye exam annually.   Hyperlipidemia/Aortic Atherosclerosis: Current medications: Tolerating crestor 40 mg 1 pill daily, zetia 10 mg before bed, and Gemfibrozil 600 mg twice a day. Ran out of fish oil otc recently.  Patient was on Repatha but it was too expensive.   Hypertension: Current medications: Losartan 100 mg once daily, metoprolol tartrate 25 mg daily.    COPD/Asthma: Current medications: on wixela 1 puff twice daily. No dyspnea. Albuterol as needed.  GERD: on omeprazole 20 mg daily.   BPH:   myrbetriq daily.    Complaining of a rash under his left arm which has been there for 2 months.  It does not hurt.  Does not itch.  He has not tried any creams on it.  He is trying to get in to dermatology for the last 2 months in the [place] and having little success.  There is post to get him an appointment outside of the [place] since they are backed up but he has not heard anything.  The lesion does not cause any discomfort or itching and has not changed in size. The patient has not applied any creams or treatments to the area. The patient has been trying to get an appointment with a dermatologist but has been unsuccessful. The patient also has a long-standing cyst on his shoulder which he plans to have removed by a dermatologist. The patient also mentions having crusty spots on his ears.      05/22/2023    7:41 AM 02/20/2023   10:45 AM 01/29/2023    7:35 AM 10/05/2022    3:05 PM 12/29/2021    2:47 PM  Depression screen PHQ 2/9  Decreased Interest 0 0 0 0 0  Down, Depressed, Hopeless 0 0 0 0 0  PHQ - 2 Score 0 0 0 0 0  Altered sleeping 0 0 0    Tired, decreased energy 0 1 1    Change in appetite 0 0 0    Feeling bad or failure about  yourself  0 0 0    Trouble concentrating 0 0 0    Moving slowly or fidgety/restless 0 0 0    Suicidal thoughts 0 0 0    PHQ-9 Score 0 1 1    Difficult doing work/chores Not difficult at all Not difficult at all Not difficult at all          05/22/2023    7:41 AM  Fall Risk   Falls in the past year? 1  Number falls in past yr: 0  Injury with Fall? 0  Risk for fall due to : History of fall(s)  Follow up Falls evaluation completed;Follow up appointment    Patient Care Team: Blane Ohara, MD as PCP - General (Family Medicine) Zettie Pho, [facility] (Inactive) as Pharmacist (Pharmacist) [facility] [place], [place]   Review of Systems  Constitutional:  Negative for chills, diaphoresis, fatigue and fever.  HENT:  Negative for congestion, ear pain and sore throat.   Respiratory:  Negative for cough and shortness of breath.   Cardiovascular:  Negative for chest pain and leg swelling.  Gastrointestinal:  Negative for abdominal pain, constipation, diarrhea, nausea and vomiting.  Genitourinary:  Negative  for dysuria and urgency.  Musculoskeletal:  Negative for arthralgias and myalgias.  Neurological:  Negative for dizziness and headaches.  Psychiatric/Behavioral:  Negative for dysphoric mood.     Current Outpatient Medications on File Prior to Visit  Medication Sig Dispense Refill   albuterol (VENTOLIN HFA) 108 (90 Base) MCG/ACT inhaler Inhale 2 puffs into the lungs every 6 (six) hours as needed for wheezing or shortness of breath. 8 g 2   cetirizine (ZYRTEC) 10 MG tablet Take 1 tablet (10 mg total) by mouth daily. 100 tablet 0   Empagliflozin-metFORMIN HCl ER 12.12-998 MG TB24 Take 1 tablet by mouth daily.     ezetimibe (ZETIA) 10 MG tablet Take 10 mg by mouth daily.     fluticasone (FLONASE) 50 MCG/ACT nasal spray Place 2 sprays into both nostrils daily. 16 g 6   fluticasone-salmeterol (WIXELA INHUB) 250-50 MCG/ACT AEPB Inhale 1 puff into the lungs in the morning and at bedtime. 3 each 1    gemfibrozil (LOPID) 600 MG tablet Take 600 mg by mouth 2 (two) times daily before a meal.     losartan (COZAAR) 100 MG tablet Take 1 tablet (100 mg total) by mouth daily. 90 tablet 1   metoprolol tartrate (LOPRESSOR) 25 MG tablet TAKE 1 TABLET BY MOUTH TWICE A DAY 180 tablet 2   Mirabegron (MYRBETRIQ PO) Take 25 tablets by mouth daily.     naproxen sodium (ANAPROX) 550 MG tablet Take 550 mg by mouth 2 (two) times daily.     Omega 3 1000 MG CAPS Take 2 capsules (2,000 mg total) by mouth daily. (Patient not taking: Reported on 05/22/2023) 180 capsule 1   omeprazole (PRILOSEC) 20 MG capsule Take 20 mg by mouth daily.     rosuvastatin (CRESTOR) 40 MG tablet Take 40 mg by mouth daily.     tadalafil (CIALIS) 10 MG tablet Take 10 mg by mouth daily as needed for erectile dysfunction.     No current facility-administered medications on file prior to visit.   Past Medical History:  Diagnosis Date   Actinic keratosis 10/31/2020   Benign prostatic hyperplasia 10/31/2020   Mar 13, 2014 Entered By: Caprice Kluver B Comment: s/p TURP 2010   Body mass index (BMI) 33.0-33.9, adult 11/23/2020   BPPV (benign paroxysmal positional vertigo)    Calculus of kidney    Calculus of kidney    Cancer (HCC) 2015   skin cancer removed off left forearm    Cataract extraction status 10/31/2020   Apr 17, 2009 Entered By: Suzette Battiest Comment: bilateral   Class 2 severe obesity due to excess calories with serious comorbidity and body mass index (BMI) of 35.0 to 35.9 in adult [facility]) 06/10/2021   Closed fracture of body of sternum 11/10/2019   Contusion of right thigh 11/10/2019   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/2022   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   Encounter for fitting and adjustment of hearing aid 03/22/2021   Encounter for immunization 06/02/2022   Epidermoid cyst of skin 10/31/2020   Fatigue 11/23/2020   Gastroesophageal reflux disease 10/31/2020   GERD (gastroesophageal  reflux disease)    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   Hemorrhage of gastrointestinal tract 10/31/2020   History of basal cell carcinoma (BCC)    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   Joint pain 11/23/2020   Long-term current use of testosterone replacement therapy 11/23/2020   Low libido 11/23/2020   Mixed hyperlipidemia    Myalgia due to statin 12/14/2021   Need for immunization  against influenza 06/10/2021   Osteoarthritis    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   Pain in limb 09/01/2022   Polyp of colon 11/01/2020   Aug 31, 2015 Entered By: Caprice Kluver B Comment: colonoscopy 05/2015 - needs repeat ASAP   Prediabetes    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   Shortness of breath 09/01/2022   Sleep disturbance 11/23/2020   Status post craniotomy 02/10/2020   Stiffness of unspecified hand, not elsewhere classified 06/02/2022   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   Urge incontinence 11/23/2020   Urge incontinence of urine 11/23/2020   Vitamin D deficiency    Past Surgical History:  Procedure Laterality Date   CRANIOTOMY Left 02/10/2020   Procedure: CRANIOTOMY HEMATOMA EVACUATION SUBDURAL;  Surgeon: Julio Sicks, MD;  Location: [facility] OR;  Service: Neurosurgery;  Laterality: Left;   EYE SURGERY Bilateral 2012   cataracts w lens implants   PROSTATECTOMY  2008   BPH   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   TEMPORAL ARTERY BIOPSY / LIGATION Bilateral 1997   negative   TRANSURETHRAL RESECTION OF PROSTATE  2010    Family History  Problem Relation Age of Onset   Brain cancer Mother    CAD Father    Alcoholism Father    CAD Brother    Lung cancer Brother    CAD Brother    Social History   Socioeconomic History   Marital status: Widowed    Spouse name: Not on file   Number of children: Not on file   Years of education: Not on file   Highest education level: Not on file  Occupational History   Not on file  Tobacco Use   Smoking status: Former     Current packs/day: 0.00    Average packs/day: 1 pack/day for 20.0 years (20.0 ttl pk-yrs)    Types: Cigarettes    Start date: 01/06/1947    Quit date: 01/06/1967    Years since quitting: 56.7   Smokeless tobacco: Never  Vaping Use   Vaping status: Never Used  Substance and Sexual Activity   Alcohol use: Yes    Comment: occassionally   Drug use: No   Sexual activity: Not on file  Other Topics Concern   Not on file  Social History Narrative   Not on file   Social Drivers of Health   Financial Resource Strain: Low Risk  (02/20/2023)   Overall Financial Resource Strain (CARDIA)    Difficulty of Paying Living Expenses: Not hard at all  Food Insecurity: No Food Insecurity (02/20/2023)   Hunger Vital Sign    Worried About Running Out of Food in the Last Year: Never true    Ran Out of Food in the Last Year: Never true  Transportation Needs: No Transportation Needs (02/20/2023)   PRAPARE - Administrator, Civil Service (Medical): No    Lack of Transportation (Non-Medical): No  Physical Activity: Sufficiently Active (02/20/2023)   Exercise Vital Sign    Days of Exercise per Week: 5 days    Minutes of Exercise per Session: 30 min  Stress: No Stress Concern Present (02/20/2023)   [employer] of Occupational Health - Occupational Stress Questionnaire    Feeling of Stress : Not at all  Social Connections: Moderately Isolated (02/20/2023)   Social Connection and Isolation Panel [NHANES]    Frequency of Communication with Friends and Family: Twice a week    Frequency of Social Gatherings with Friends and Family: Twice  a week    Attends Religious Services: Never    Active Member of Clubs or Organizations: No    Attends Banker Meetings: Never    Marital Status: Married    Objective:  BP 130/74   Pulse 72   Temp 97.6 F (36.4 C)   Ht 5\' 11"  (1.803 m)   Wt 231 lb (104.8 kg)   SpO2 98%   BMI 32.22 kg/m      09/06/2023    7:43 AM 05/22/2023    7:36 AM  04/26/2023   11:11 AM  BP/Weight  Systolic BP 130 120 136  Diastolic BP 74 60 78  Wt. (Lbs) 231 222.6 222  BMI 32.22 kg/m2 31.05 kg/m2 30.96 kg/m2    Physical Exam Vitals reviewed.  Constitutional:      Appearance: Normal appearance. He is obese.  Neck:     Vascular: No carotid bruit.  Cardiovascular:     Rate and Rhythm: Normal rate and regular rhythm.     Heart sounds: No murmur heard. Pulmonary:     Effort: Pulmonary effort is normal.     Breath sounds: Normal breath sounds.  Abdominal:     General: Abdomen is flat. Bowel sounds are normal.     Palpations: Abdomen is soft.     Tenderness: There is no abdominal tenderness.  Neurological:     Mental Status: He is alert and oriented to person, place, and time.  Psychiatric:        Mood and Affect: Mood normal.        Behavior: Behavior normal.   No induration.  Nontender.  Diabetic Foot Exam - Simple   Simple Foot Form  09/06/2023 10:44 PM  Visual Inspection No deformities, no ulcerations, no other skin breakdown bilaterally: Yes Sensation Testing Intact to touch and monofilament testing bilaterally: Yes Pulse Check Posterior Tibialis and Dorsalis pulse intact bilaterally: Yes Comments      Lab Results  Component Value Date   WBC 5.6 09/06/2023   HGB 15.2 09/06/2023   HCT 49.1 09/06/2023   PLT 240 09/06/2023   GLUCOSE 110 (H) 09/06/2023   CHOL 168 09/06/2023   TRIG 135 09/06/2023   HDL 45 09/06/2023   LDLCALC 99 09/06/2023   ALT 11 09/06/2023   AST 15 09/06/2023   NA 142 09/06/2023   K 4.3 09/06/2023   CL 104 09/06/2023   CREATININE 1.12 09/06/2023   BUN 18 09/06/2023   CO2 21 09/06/2023   TSH 3.800 09/09/2021   INR 1.1 02/10/2020   HGBA1C 6.0 (H) 09/06/2023      Assessment & Plan:    Diabetic nephropathy associated with type 2 diabetes mellitus (HCC) Assessment & Plan: Control: Controlled Recommend check feet daily. Recommend annual eye exams. Medicines:  synjardy 12.12/998 mg once  daily. Continue to work on eating a healthy diet and exercise.  Check labs.  Check microalbuminuria.  Orders: -     Hemoglobin A1c -     Microalbumin / creatinine urine ratio  GERD without esophagitis Assessment & Plan: The current medical regimen is effective;  continue present plan and medications. omeprazole 20 mg daily.    Mixed hyperlipidemia Assessment & Plan: No changes to medicines. Continue crestor 20 mg daily, zetia 10 mg daily,  fish oil 1000 mg twice daily and lopid 600 mg twice daily.   Continue to work on eating a healthy diet and exercise.     Orders: -     Lipid panel  Essential hypertension,  benign Assessment & Plan: Well controlled.  No changes to medicines. Continue Losartan 100 mg once daily, Metoprolol 25 mg twice a day. Continue to work on eating a healthy diet and exercise.    Orders: -     CBC with Differential/Platelet -     Comprehensive metabolic panel  Intertrigo Assessment & Plan: Well-circumscribed, non-pruritic, non-painful lesion under the arm present for two months. Differential includes fungal infection or dermatitis. No other similar lesions noted on the body. -Start Lotrimin cream twice daily. -If no improvement in one week, consider referral to dermatology or trial of steroid cream.  Orders: -     Clotrimazole-Betamethasone; Apply 1 Application topically 2 (two) times daily.  Dispense: 45 g; Refill: 0  Class 1 obesity due to excess calories with serious comorbidity and body mass index (BMI) of 32.0 to 32.9 in adult Assessment & Plan: - Exercise at least 30-45 minutes a day, 3-4 days a week.  - Eat a low-fat diet with lots of fruits and vegetables, up to 7-9 servings per day.    Simple chronic bronchitis (HCC) Assessment & Plan: The current medical regimen is effective;  continue present plan and medications.  Wixela, albuterol.      Meds ordered this encounter  Medications   clotrimazole-betamethasone (LOTRISONE) cream     Sig: Apply 1 Application topically 2 (two) times daily.    Dispense:  45 g    Refill:  0    Orders Placed This Encounter  Procedures   CBC with Differential/Platelet   Comprehensive metabolic panel   Hemoglobin A1c   Lipid panel   Microalbumin / creatinine urine ratio     Follow-up: Return in about 3 months (around 12/05/2023) for chronic fasting.   I,Marla I Leal-Borjas,acting as a scribe for Blane Ohara, MD.,have documented all relevant documentation on the behalf of Blane Ohara, MD,as directed by  Blane Ohara, MD while in the presence of Blane Ohara, MD.   An After Visit Summary was printed and given to the patient.  I attest that I have reviewed this visit and agree with the plan scribed by my staff.   Blane Ohara, MD [facility] [phone]

## 2023-09-06 ENCOUNTER — Encounter: Payer: Self-pay | Admitting: Family Medicine

## 2023-09-06 ENCOUNTER — Ambulatory Visit (INDEPENDENT_AMBULATORY_CARE_PROVIDER_SITE_OTHER): Payer: No Typology Code available for payment source | Admitting: Family Medicine

## 2023-09-06 VITALS — BP 130/74 | HR 72 | Temp 97.6°F | Ht 71.0 in | Wt 231.0 lb

## 2023-09-06 DIAGNOSIS — E6609 Other obesity due to excess calories: Secondary | ICD-10-CM

## 2023-09-06 DIAGNOSIS — E782 Mixed hyperlipidemia: Secondary | ICD-10-CM | POA: Diagnosis not present

## 2023-09-06 DIAGNOSIS — E66811 Obesity, class 1: Secondary | ICD-10-CM

## 2023-09-06 DIAGNOSIS — Z6832 Body mass index (BMI) 32.0-32.9, adult: Secondary | ICD-10-CM

## 2023-09-06 DIAGNOSIS — L304 Erythema intertrigo: Secondary | ICD-10-CM

## 2023-09-06 DIAGNOSIS — J410 Simple chronic bronchitis: Secondary | ICD-10-CM

## 2023-09-06 DIAGNOSIS — I10 Essential (primary) hypertension: Secondary | ICD-10-CM | POA: Diagnosis not present

## 2023-09-06 DIAGNOSIS — K219 Gastro-esophageal reflux disease without esophagitis: Secondary | ICD-10-CM

## 2023-09-06 DIAGNOSIS — M059 Rheumatoid arthritis with rheumatoid factor, unspecified: Secondary | ICD-10-CM

## 2023-09-06 DIAGNOSIS — E1121 Type 2 diabetes mellitus with diabetic nephropathy: Secondary | ICD-10-CM

## 2023-09-06 HISTORY — DX: Erythema intertrigo: L30.4

## 2023-09-06 HISTORY — DX: Other obesity due to excess calories: E66.09

## 2023-09-06 MED ORDER — CLOTRIMAZOLE-BETAMETHASONE 1-0.05 % EX CREA: 1.0000 | TOPICAL_CREAM | Freq: Two times a day (BID) | CUTANEOUS | 0 refills | Status: DC

## 2023-09-06 NOTE — Patient Instructions (Signed)
Continue all current medications.  Start on Lotrisone cream twice a day for 1 week if no improvement in rash on your left arm please let us know.

## 2023-09-06 NOTE — Assessment & Plan Note (Signed)
Well controlled.  No changes to medicines. Continue Losartan 100 mg once daily, Metoprolol 25 mg twice a day. Continue to work on eating a healthy diet and exercise.

## 2023-09-06 NOTE — Assessment & Plan Note (Addendum)
Well-circumscribed, non-pruritic, non-painful lesion under the arm present for two months. Differential includes fungal infection or dermatitis. No other similar lesions noted on the body. -Start Lotrimin cream twice daily. -If no improvement in one week, consider referral to dermatology or trial of steroid cream.

## 2023-09-06 NOTE — Assessment & Plan Note (Signed)
Control: Controlled Recommend check feet daily. Recommend annual eye exams. Medicines:  synjardy 12.12/998 mg once daily. Continue to work on eating a healthy diet and exercise.  Check labs.  Check microalbuminuria.

## 2023-09-06 NOTE — Assessment & Plan Note (Addendum)
No changes to medicines. Continue crestor 20 mg daily, zetia 10 mg daily,  fish oil 1000 mg twice daily and lopid 600 mg twice daily.   Continue to work on eating a healthy diet and exercise.

## 2023-09-06 NOTE — Assessment & Plan Note (Signed)
The current medical regimen is effective;  continue present plan and medications. omeprazole 20 mg daily.

## 2023-09-07 LAB — CBC WITH DIFFERENTIAL/PLATELET
Basophils Absolute: 0.1 10*3/uL (ref 0.0–0.2)
Basos: 1 %
EOS (ABSOLUTE): 0.2 10*3/uL (ref 0.0–0.4)
Eos: 4 %
Hematocrit: 49.1 % (ref 37.5–51.0)
Hemoglobin: 15.2 g/dL (ref 13.0–17.7)
Immature Grans (Abs): 0 10*3/uL (ref 0.0–0.1)
Immature Granulocytes: 0 %
Lymphocytes Absolute: 1.1 10*3/uL (ref 0.7–3.1)
Lymphs: 19 %
MCH: 25.5 pg — ABNORMAL LOW (ref 26.6–33.0)
MCHC: 31 g/dL — ABNORMAL LOW (ref 31.5–35.7)
MCV: 83 fL (ref 79–97)
Monocytes Absolute: 0.7 10*3/uL (ref 0.1–0.9)
Monocytes: 13 %
Neutrophils Absolute: 3.5 10*3/uL (ref 1.4–7.0)
Neutrophils: 63 %
Platelets: 240 10*3/uL (ref 150–450)
RBC: 5.95 x10E6/uL — ABNORMAL HIGH (ref 4.14–5.80)
RDW: 16.9 % — ABNORMAL HIGH (ref 11.6–15.4)
WBC: 5.6 10*3/uL (ref 3.4–10.8)

## 2023-09-07 LAB — COMPREHENSIVE METABOLIC PANEL
ALT: 11 [IU]/L (ref 0–44)
AST: 15 [IU]/L (ref 0–40)
Albumin: 4.4 g/dL (ref 3.7–4.7)
Alkaline Phosphatase: 51 [IU]/L (ref 44–121)
BUN/Creatinine Ratio: 16 (ref 10–24)
BUN: 18 mg/dL (ref 8–27)
Bilirubin Total: 0.7 mg/dL (ref 0.0–1.2)
CO2: 21 mmol/L (ref 20–29)
Calcium: 9.6 mg/dL (ref 8.6–10.2)
Chloride: 104 mmol/L (ref 96–106)
Creatinine, Ser: 1.12 mg/dL (ref 0.76–1.27)
Globulin, Total: 2.5 g/dL (ref 1.5–4.5)
Glucose: 110 mg/dL — ABNORMAL HIGH (ref 70–99)
Potassium: 4.3 mmol/L (ref 3.5–5.2)
Sodium: 142 mmol/L (ref 134–144)
Total Protein: 6.9 g/dL (ref 6.0–8.5)
eGFR: 65 mL/min/{1.73_m2} (ref >59)

## 2023-09-07 LAB — LIPID PANEL
Chol/HDL Ratio: 3.7 {ratio} (ref 0.0–5.0)
Cholesterol, Total: 168 mg/dL (ref 100–199)
HDL: 45 mg/dL (ref >39)
LDL Chol Calc (NIH): 99 mg/dL (ref 0–99)
Triglycerides: 135 mg/dL (ref 0–149)
VLDL Cholesterol Cal: 24 mg/dL (ref 5–40)

## 2023-09-07 LAB — HEMOGLOBIN A1C
Est. average glucose Bld gHb Est-mCnc: 126 mg/dL
Hgb A1c MFr Bld: 6 % — ABNORMAL HIGH (ref 4.8–5.6)

## 2023-09-07 LAB — MICROALBUMIN / CREATININE URINE RATIO
Creatinine, Urine: 113.1 mg/dL
Microalb/Creat Ratio: 9 mg/g{creat} (ref 0–29)
Microalbumin, Urine: 9.7 ug/mL

## 2023-09-09 NOTE — Assessment & Plan Note (Signed)
The current medical regimen is effective;  continue present plan and medications.  Wixela, albuterol.

## 2023-09-09 NOTE — Assessment & Plan Note (Addendum)
-   Exercise at least 30-45 minutes a day, 3-4 days a week.  - Eat a low-fat diet with lots of fruits and vegetables, up to 7-9 servings per day.

## 2023-09-18 ENCOUNTER — Ambulatory Visit (INDEPENDENT_AMBULATORY_CARE_PROVIDER_SITE_OTHER): Payer: No Typology Code available for payment source

## 2023-09-18 VITALS — BP 128/72 | HR 63 | Temp 97.6°F | Ht 71.0 in | Wt 232.0 lb

## 2023-09-18 DIAGNOSIS — J42 Unspecified chronic bronchitis: Secondary | ICD-10-CM | POA: Diagnosis not present

## 2023-09-18 DIAGNOSIS — R0982 Postnasal drip: Secondary | ICD-10-CM

## 2023-09-18 DIAGNOSIS — J069 Acute upper respiratory infection, unspecified: Secondary | ICD-10-CM | POA: Diagnosis not present

## 2023-09-18 DIAGNOSIS — H18593 Other hereditary corneal dystrophies, bilateral: Secondary | ICD-10-CM

## 2023-09-18 DIAGNOSIS — N3281 Overactive bladder: Secondary | ICD-10-CM

## 2023-09-18 HISTORY — DX: Other hereditary corneal dystrophies, bilateral: H18.593

## 2023-09-18 HISTORY — DX: Overactive bladder: N32.81

## 2023-09-18 MED ORDER — DOXYCYCLINE HYCLATE 100 MG PO TABS: 100.0000 mg | ORAL_TABLET | Freq: Two times a day (BID) | ORAL | 0 refills | Status: AC

## 2023-09-18 MED ORDER — FLUTICASONE PROPIONATE 50 MCG/ACT NA SUSP: 2.0000 | Freq: Every day | NASAL | 6 refills | Status: AC

## 2023-09-18 NOTE — Assessment & Plan Note (Signed)
COPD likely secondary to 20-year smoking history. Using inhalers as prescribed. Current URTI poses risk of COPD exacerbation. Advised to monitor for exacerbation signs and start antibiotic DOXYCYCLINE 100 MG TWICE DAILY FOR 7 DAYS if symptoms worsen. - Continue Wixela inhaler - Use rescue inhaler as needed - Monitor for COPD exacerbation signs and start antibiotic if symptoms worsen  Follow-up - Send prescription to [facility] [address] - Follow-up if symptoms do not improve or worsen after one week.

## 2023-09-18 NOTE — Progress Notes (Signed)
 Acute Office Visit  Subjective:    Patient ID: Christian Lowe., male    DOB: 11-Jul-1941, 83 y.o.   MRN: [id number]  No chief complaint on file.   Discussed the use of AI scribe software for clinical note transcription with the patient, who gave verbal consent to proceed.      HPI: Patient is in today for cough, congestion, and fatigue. Patient states he has had the above for about 4-5 days.  Christian Lowe. "Loleta Books" is an 83 year old male with COPD who presents with a cough and cold symptoms. He is accompanied by his wife.  He developed cold symptoms approximately four to five days ago, including a cough that produces greenish phlegm and a sensation of postnasal drip. His wife had similar symptoms but is improving, whereas his symptoms are worsening. He has no fever or ear pain but experiences nasal congestion and drainage.  He has been using Mucinex at night and his inhalers as prescribed. He occasionally uses Flonase nasal spray but not regularly, as his nasal congestion has not been significant. He also takes Zyrtec for his symptoms.  He has a history of COPD and smoked for about twenty years from 66 to 1968 before quitting. He uses a Wixela inhaler and has been compliant with his inhaler regimen. Has not been using his Flonase recently. Needs refill  Past Medical History:  Diagnosis Date   Actinic keratosis 10/31/2020   Benign prostatic hyperplasia 10/31/2020   Mar 13, 2014 Entered By: Caprice Kluver B Comment: s/p TURP 2010   Body mass index (BMI) 33.0-33.9, adult 11/23/2020   BPPV (benign paroxysmal positional vertigo)    Calculus of kidney    Calculus of kidney    Cancer (HCC) 2015   skin cancer removed off left forearm    Cataract extraction status 10/31/2020   Apr 17, 2009 Entered By: Suzette Battiest Comment: bilateral   Class 2 severe obesity due to excess calories with serious comorbidity and body mass index (BMI) of 35.0 to 35.9 in adult (HCC)  06/10/2021   Closed fracture of body of sternum 11/10/2019   Contusion of right thigh 11/10/2019   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/2022   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   Encounter for fitting and adjustment of hearing aid 03/22/2021   Encounter for immunization 06/02/2022   Epidermoid cyst of skin 10/31/2020   Fatigue 11/23/2020   Gastroesophageal reflux disease 10/31/2020   GERD (gastroesophageal reflux disease)    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   Hemorrhage of gastrointestinal tract 10/31/2020   History of basal cell carcinoma (BCC)    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   Joint pain 11/23/2020   Long-term current use of testosterone replacement therapy 11/23/2020   Low libido 11/23/2020   Mixed hyperlipidemia    Myalgia due to statin 12/14/2021   Need for immunization against influenza 06/10/2021   Osteoarthritis    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   Pain in limb 09/01/2022   Polyp of colon 11/01/2020   Aug 31, 2015 Entered By: Caprice Kluver B Comment: colonoscopy 05/2015 - needs repeat ASAP   Prediabetes    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   Shortness of breath 09/01/2022   Sleep disturbance 11/23/2020   Status post craniotomy 02/10/2020   Stiffness of unspecified hand, not elsewhere classified 06/02/2022   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   Urge incontinence 11/23/2020   Urge incontinence of urine 11/23/2020   Vitamin D  deficiency     Past Surgical History:  Procedure Laterality Date   CRANIOTOMY Left 02/10/2020   Procedure: CRANIOTOMY HEMATOMA EVACUATION SUBDURAL;  Surgeon: Julio Sicks, MD;  Location: [facility] OR;  Service: Neurosurgery;  Laterality: Left;   EYE SURGERY Bilateral 2012   cataracts w lens implants   PROSTATECTOMY  2008   BPH   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   TEMPORAL ARTERY BIOPSY / LIGATION Bilateral 1997   negative   TRANSURETHRAL RESECTION OF PROSTATE  2010    Family  History  Problem Relation Age of Onset   Brain cancer Mother    CAD Father    Alcoholism Father    CAD Brother    Lung cancer Brother    CAD Brother     Social History   Socioeconomic History   Marital status: Widowed    Spouse name: Not on file   Number of children: Not on file   Years of education: Not on file   Highest education level: Not on file  Occupational History   Not on file  Tobacco Use   Smoking status: Former    Current packs/day: 0.00    Average packs/day: 1 pack/day for 20.0 years (20.0 ttl pk-yrs)    Types: Cigarettes    Start date: 01/06/1947    Quit date: 01/06/1967    Years since quitting: 56.7   Smokeless tobacco: Never  Vaping Use   Vaping status: Never Used  Substance and Sexual Activity   Alcohol use: Yes    Comment: occassionally   Drug use: No   Sexual activity: Not on file  Other Topics Concern   Not on file  Social History Narrative   Not on file   Social Drivers of Health   Financial Resource Strain: Low Risk  (02/20/2023)   Overall Financial Resource Strain (CARDIA)    Difficulty of Paying Living Expenses: Not hard at all  Food Insecurity: No Food Insecurity (02/20/2023)   Hunger Vital Sign    Worried About Running Out of Food in the Last Year: Never true    Ran Out of Food in the Last Year: Never true  Transportation Needs: No Transportation Needs (02/20/2023)   PRAPARE - Administrator, Civil Service (Medical): No    Lack of Transportation (Non-Medical): No  Physical Activity: Sufficiently Active (02/20/2023)   Exercise Vital Sign    Days of Exercise per Week: 5 days    Minutes of Exercise per Session: 30 min  Stress: No Stress Concern Present (02/20/2023)   [employer] of Occupational Health - Occupational Stress Questionnaire    Feeling of Stress : Not at all  Social Connections: Moderately Isolated (02/20/2023)   Social Connection and Isolation Panel [NHANES]    Frequency of Communication with Friends and Family:  Twice a week    Frequency of Social Gatherings with Friends and Family: Twice a week    Attends Religious Services: Never    Database administrator or Organizations: No    Attends Banker Meetings: Never    Marital Status: Married  Catering manager Violence: Not At Risk (02/20/2023)   Humiliation, Afraid, Rape, and Kick questionnaire    Fear of Current or Ex-Partner: No    Emotionally Abused: No    Physically Abused: No    Sexually Abused: No    Outpatient Medications Prior to Visit  Medication Sig Dispense Refill   albuterol (VENTOLIN HFA) 108 (90 Base) MCG/ACT inhaler Inhale 2 puffs into the  lungs every 6 (six) hours as needed for wheezing or shortness of breath. 8 g 2   cetirizine (ZYRTEC) 10 MG tablet Take 1 tablet (10 mg total) by mouth daily. 100 tablet 0   clotrimazole-betamethasone (LOTRISONE) cream Apply 1 Application topically 2 (two) times daily. 45 g 0   Empagliflozin-metFORMIN HCl ER 12.12-998 MG TB24 Take 1 tablet by mouth daily.     ezetimibe (ZETIA) 10 MG tablet Take 10 mg by mouth daily.     fluticasone-salmeterol (WIXELA INHUB) 250-50 MCG/ACT AEPB Inhale 1 puff into the lungs in the morning and at bedtime. 3 each 1   gemfibrozil (LOPID) 600 MG tablet Take 600 mg by mouth 2 (two) times daily before a meal.     losartan (COZAAR) 100 MG tablet Take 1 tablet (100 mg total) by mouth daily. 90 tablet 1   metoprolol tartrate (LOPRESSOR) 25 MG tablet TAKE 1 TABLET BY MOUTH TWICE A DAY 180 tablet 2   Mirabegron (MYRBETRIQ PO) Take 25 tablets by mouth daily.     naproxen sodium (ANAPROX) 550 MG tablet Take 550 mg by mouth 2 (two) times daily.     Omega 3 1000 MG CAPS Take 2 capsules (2,000 mg total) by mouth daily. 180 capsule 1   omeprazole (PRILOSEC) 20 MG capsule Take 20 mg by mouth daily.     rosuvastatin (CRESTOR) 40 MG tablet Take 40 mg by mouth daily.     tadalafil (CIALIS) 10 MG tablet Take 10 mg by mouth daily as needed for erectile dysfunction.      fluticasone (FLONASE) 50 MCG/ACT nasal spray Place 2 sprays into both nostrils daily. 16 g 6   No facility-administered medications prior to visit.    Allergies  Allergen Reactions   Atorvastatin     Other reaction(s): Muscle pain   Codeine Other (See Comments)   Niacin Itching    Other reaction(s): Itching, Flushing   Piroxicam     Other reaction(s): Gastric ulcer with hemorrhage   Pravastatin     Other reaction(s): Muscle pain   Statins     myalgias   Zocor [Simvastatin]     Other reaction(s): Muscle pain    Review of Systems  Constitutional:  Positive for fatigue. Negative for chills and fever.  HENT:  Positive for congestion. Negative for ear pain and sinus pain.   Respiratory:  Positive for cough. Negative for shortness of breath.   Cardiovascular:  Negative for chest pain.  Gastrointestinal:  Negative for abdominal pain, constipation, diarrhea, nausea and vomiting.  Musculoskeletal:  Negative for myalgias.  Neurological:  Negative for headaches.       Objective:        09/18/2023   10:53 AM 09/06/2023    7:43 AM 05/22/2023    7:36 AM  Vitals with BMI  Height 5\' 11"  5\' 11"  5\' 11"   Weight 232 lbs 231 lbs 222 lbs 10 oz  BMI 32.37 32.23 31.06  Systolic 128 130 829  Diastolic 72 74 60  Pulse 63 72 60    No data found.   Physical Exam Vitals and nursing note reviewed.  HENT:     Head: Normocephalic.     Comments: Bilateral hearing aids noted    Nose: Congestion present.     Mouth/Throat:     Pharynx: Posterior oropharyngeal erythema (mild erythema with post nasal drainage) present. No oropharyngeal exudate.  Cardiovascular:     Rate and Rhythm: Normal rate and regular rhythm.  Pulmonary:     Effort:  Pulmonary effort is normal.     Breath sounds: Normal breath sounds.  Musculoskeletal:        General: Normal range of motion.     Cervical back: Normal range of motion.  Skin:    General: Skin is warm.  Neurological:     General: No focal deficit  present.     Mental Status: He is alert.  Psychiatric:        Mood and Affect: Mood normal.     Health Maintenance Due  Topic Date Due   OPHTHALMOLOGY EXAM  Never done    There are no preventive care reminders to display for this patient.   Lab Results  Component Value Date   TSH 3.800 09/09/2021   Lab Results  Component Value Date   WBC 5.6 09/06/2023   HGB 15.2 09/06/2023   HCT 49.1 09/06/2023   MCV 83 09/06/2023   PLT 240 09/06/2023   Lab Results  Component Value Date   NA 142 09/06/2023   K 4.3 09/06/2023   CO2 21 09/06/2023   GLUCOSE 110 (H) 09/06/2023   BUN 18 09/06/2023   CREATININE 1.12 09/06/2023   BILITOT 0.7 09/06/2023   ALKPHOS 51 09/06/2023   AST 15 09/06/2023   ALT 11 09/06/2023   PROT 6.9 09/06/2023   ALBUMIN 4.4 09/06/2023   CALCIUM 9.6 09/06/2023   ANIONGAP 11 02/11/2020   EGFR 65 09/06/2023   Lab Results  Component Value Date   CHOL 168 09/06/2023   Lab Results  Component Value Date   HDL 45 09/06/2023   Lab Results  Component Value Date   LDLCALC 99 09/06/2023   Lab Results  Component Value Date   TRIG 135 09/06/2023   Lab Results  Component Value Date   CHOLHDL 3.7 09/06/2023   Lab Results  Component Value Date   HGBA1C 6.0 (H) 09/06/2023       Assessment & Plan:  URI with cough and congestion Assessment & Plan: Presents with cold and cough for 4-5 days, likely contracted from his grandson's wife. Symptoms include cough with greenish phlegm, postnasal drainage, and nasal congestion. No fever or ear pain.   Physical exam shows inflamed nasal passages and significant oropharyngeal drainage. Lungs clear on auscultation, no lymphadenopathy. Likely viral URTI with potential COPD exacerbation risk.   Advised to delay antibiotics to avoid unnecessary medication and potential kidney harm. If symptoms worsen or persist after one week, start antibiotics. - Prescribe antibiotic DOXYCYCLINE with instructions to use if symptoms  worsen after one week - Refill Flonase nasal spray - Continue Wixela inhaler and rescue inhaler as needed - Continue Mucinex - Advise increased fluid intake - Monitor symptoms and start antibiotic if no improvement or worsening after one week   Chronic bronchitis, unspecified chronic bronchitis type (HCC) Assessment & Plan: COPD likely secondary to 20-year smoking history. Using inhalers as prescribed. Current URTI poses risk of COPD exacerbation. Advised to monitor for exacerbation signs and start antibiotic DOXYCYCLINE 100 MG TWICE DAILY FOR 7 DAYS if symptoms worsen. - Continue Wixela inhaler - Use rescue inhaler as needed - Monitor for COPD exacerbation signs and start antibiotic if symptoms worsen  Follow-up - Send prescription to [facility] [address] - Follow-up if symptoms do not improve or worsen after one week.   Post-nasal drip -     Fluticasone Propionate; Place 2 sprays into both nostrils daily.  Dispense: 16 g; Refill: 6  Other orders -     Doxycycline Hyclate;  Take 1 tablet (100 mg total) by mouth 2 (two) times daily for 7 days.  Dispense: 14 tablet; Refill: 0     Meds ordered this encounter  Medications   doxycycline (VIBRA-TABS) 100 MG tablet    Sig: Take 1 tablet (100 mg total) by mouth 2 (two) times daily for 7 days.    Dispense:  14 tablet    Refill:  0   fluticasone (FLONASE) 50 MCG/ACT nasal spray    Sig: Place 2 sprays into both nostrils daily.    Dispense:  16 g    Refill:  6    No orders of the defined types were placed in this encounter.    Follow-up: No follow-ups on file.  An After Visit Summary was printed and given to the patient.  Windell Moment, MD [facility] [phone]

## 2023-09-18 NOTE — Patient Instructions (Signed)
VISIT SUMMARY:  Today, we discussed your recent cold symptoms, including a cough with greenish phlegm, postnasal drip, and nasal congestion. We also reviewed your COPD management and the potential risk of exacerbation due to your current illness.  YOUR PLAN:  -UPPER RESPIRATORY TRACT INFECTION (URTI): An upper respiratory tract infection is a viral infection that affects the nose, throat, and airways. You have been experiencing symptoms for 4-5 days, likely contracted from a family member. We recommend continuing your current medications, including Mucinex and Flonase nasal spray, and increasing your fluid intake. If your symptoms worsen or do not improve after one week, you should start the prescribed antibiotic.  -CHRONIC OBSTRUCTIVE PULMONARY DISEASE (COPD): COPD is a chronic lung condition often caused by long-term smoking, which makes it hard to breathe. Your current cold symptoms could potentially worsen your COPD. Continue using your Vexela inhaler and rescue inhaler as needed. Monitor for any signs of worsening and start the antibiotic if your symptoms get worse.  INSTRUCTIONS:  We have [facility] [address]. Please follow up if your symptoms do not improve or worsen after one week.

## 2023-09-18 NOTE — Assessment & Plan Note (Signed)
Presents with cold and cough for 4-5 days, likely contracted from his grandson's wife. Symptoms include cough with greenish phlegm, postnasal drainage, and nasal congestion. No fever or ear pain.   Physical exam shows inflamed nasal passages and significant oropharyngeal drainage. Lungs clear on auscultation, no lymphadenopathy. Likely viral URTI with potential COPD exacerbation risk.   Advised to delay antibiotics to avoid unnecessary medication and potential kidney harm. If symptoms worsen or persist after one week, start antibiotics. - Prescribe antibiotic DOXYCYCLINE with instructions to use if symptoms worsen after one week - Refill Flonase nasal spray - Continue Wixela inhaler and rescue inhaler as needed - Continue Mucinex - Advise increased fluid intake - Monitor symptoms and start antibiotic if no improvement or worsening after one week

## 2023-11-13 DIAGNOSIS — L578 Other skin changes due to chronic exposure to nonionizing radiation: Secondary | ICD-10-CM | POA: Diagnosis not present

## 2023-11-13 DIAGNOSIS — L570 Actinic keratosis: Secondary | ICD-10-CM | POA: Diagnosis not present

## 2023-11-13 DIAGNOSIS — L821 Other seborrheic keratosis: Secondary | ICD-10-CM | POA: Diagnosis not present

## 2023-11-22 DIAGNOSIS — H353132 Nonexudative age-related macular degeneration, bilateral, intermediate dry stage: Secondary | ICD-10-CM

## 2023-11-22 HISTORY — DX: Nonexudative age-related macular degeneration, bilateral, intermediate dry stage: H35.3132

## 2023-12-11 NOTE — Progress Notes (Unsigned)
 Subjective:  Patient ID: Christian Lowe., male    DOB: 06-30-1941  Age: 83 y.o. MRN: [id number]  No chief complaint on file.   HPI:  Diabetes:  Not checking sugar. On synjardy ER 12.12/998 mg once daily. Eye exam annually.   Hyperlipidemia/Aortic Atherosclerosis: Current medications: Tolerating crestor 40 mg 1 pill daily, zetia  10 mg before bed, and Gemfibrozil 600 mg twice a day. Ran out of fish oil otc recently.  Patient was on Repatha  but it was too expensive.   Hypertension: Current medications: Losartan  100 mg once daily, metoprolol  tartrate 25 mg daily.    COPD/Asthma: Current medications: on wixela 1 puff twice daily. No dyspnea. Albuterol  as needed.  GERD: on omeprazole 20 mg daily.   BPH:   myrbetriq daily.       09/18/2023   10:55 AM 05/22/2023    7:41 AM 02/20/2023   10:45 AM 01/29/2023    7:35 AM 10/05/2022    3:05 PM  Depression screen PHQ 2/9  Decreased Interest 0 0 0 0 0  Down, Depressed, Hopeless 0 0 0 0 0  PHQ - 2 Score 0 0 0 0 0  Altered sleeping  0 0 0   Tired, decreased energy  0 1 1   Change in appetite  0 0 0   Feeling bad or failure about yourself   0 0 0   Trouble concentrating  0 0 0   Moving slowly or fidgety/restless  0 0 0   Suicidal thoughts  0 0 0   PHQ-9 Score  0 1 1   Difficult doing work/chores  Not difficult at all Not difficult at all Not difficult at all         09/18/2023   10:55 AM  Fall Risk   Falls in the past year? 0  Number falls in past yr: 0  Injury with Fall? 0  Risk for fall due to : No Fall Risks    Patient Care Team: Mercy Stall, MD as PCP - General (Family Medicine) Devon Fogo, [facility] (Inactive) as Pharmacist (Pharmacist) [facility] [place], [place]   Review of Systems  Current Outpatient Medications on File Prior to Visit  Medication Sig Dispense Refill   albuterol  (VENTOLIN  HFA) 108 (90 Base) MCG/ACT inhaler Inhale 2 puffs into the lungs every 6 (six) hours as needed for wheezing or shortness of  breath. 8 g 2   cetirizine  (ZYRTEC ) 10 MG tablet Take 1 tablet (10 mg total) by mouth daily. 100 tablet 0   clotrimazole -betamethasone  (LOTRISONE ) cream Apply 1 Application topically 2 (two) times daily. 45 g 0   Empagliflozin-metFORMIN  HCl ER 12.12-998 MG TB24 Take 1 tablet by mouth daily.     ezetimibe  (ZETIA ) 10 MG tablet Take 10 mg by mouth daily.     fluticasone  (FLONASE ) 50 MCG/ACT nasal spray Place 2 sprays into both nostrils daily. 16 g 6   fluticasone -salmeterol (WIXELA INHUB) 250-50 MCG/ACT AEPB Inhale 1 puff into the lungs in the morning and at bedtime. 3 each 1   gemfibrozil (LOPID) 600 MG tablet Take 600 mg by mouth 2 (two) times daily before a meal.     losartan  (COZAAR ) 100 MG tablet Take 1 tablet (100 mg total) by mouth daily. 90 tablet 1   metoprolol  tartrate (LOPRESSOR ) 25 MG tablet TAKE 1 TABLET BY MOUTH TWICE A DAY 180 tablet 2   Mirabegron (MYRBETRIQ PO) Take 25 tablets by mouth daily.     naproxen sodium (ANAPROX) 550  MG tablet Take 550 mg by mouth 2 (two) times daily.     Omega 3 1000 MG CAPS Take 2 capsules (2,000 mg total) by mouth daily. 180 capsule 1   omeprazole (PRILOSEC) 20 MG capsule Take 20 mg by mouth daily.     rosuvastatin (CRESTOR) 40 MG tablet Take 40 mg by mouth daily.     tadalafil (CIALIS) 10 MG tablet Take 10 mg by mouth daily as needed for erectile dysfunction.     No current facility-administered medications on file prior to visit.   Past Medical History:  Diagnosis Date   Actinic keratosis 10/31/2020   Benign prostatic hyperplasia 10/31/2020   Mar 13, 2014 Entered By: Landon Pinion B Comment: s/p TURP 2010   Body mass index (BMI) 33.0-33.9, adult 11/23/2020   BPPV (benign paroxysmal positional vertigo)    Calculus of kidney    Calculus of kidney    Cancer (HCC) 2015   skin cancer removed off left forearm    Cataract extraction status 10/31/2020   Apr 17, 2009 Entered By: Tempie Fee Comment: bilateral   Class 2 severe obesity due to excess  calories with serious comorbidity and body mass index (BMI) of 35.0 to 35.9 in adult (HCC) 06/10/2021   Closed fracture of body of sternum 11/10/2019   Contusion of right thigh 11/10/2019   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/2022   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   Encounter for fitting and adjustment of hearing aid 03/22/2021   Encounter for immunization 06/02/2022   Epidermoid cyst of skin 10/31/2020   Fatigue 11/23/2020   Gastroesophageal reflux disease 10/31/2020   GERD (gastroesophageal reflux disease)    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   Hemorrhage of gastrointestinal tract 10/31/2020   History of basal cell carcinoma (BCC)    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   Joint pain 11/23/2020   Long-term current use of testosterone  replacement therapy 11/23/2020   Low libido 11/23/2020   Mixed hyperlipidemia    Myalgia due to statin 12/14/2021   Need for immunization against influenza 06/10/2021   Osteoarthritis    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   Pain in limb 09/01/2022   Polyp of colon 11/01/2020   Aug 31, 2015 Entered By: Landon Pinion B Comment: colonoscopy 05/2015 - needs repeat ASAP   Prediabetes    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   Shortness of breath 09/01/2022   Sleep disturbance 11/23/2020   Status post craniotomy 02/10/2020   Stiffness of unspecified hand, not elsewhere classified 06/02/2022   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   Urge incontinence 11/23/2020   Urge incontinence of urine 11/23/2020   Vitamin D  deficiency    Past Surgical History:  Procedure Laterality Date   CRANIOTOMY Left 02/10/2020   Procedure: CRANIOTOMY HEMATOMA EVACUATION SUBDURAL;  Surgeon: Agustina Aldrich, MD;  Location: [facility] OR;  Service: Neurosurgery;  Laterality: Left;   EYE SURGERY Bilateral 2012   cataracts w lens implants   PROSTATECTOMY  2008   BPH   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   TEMPORAL ARTERY BIOPSY /  LIGATION Bilateral 1997   negative   TRANSURETHRAL RESECTION OF PROSTATE  2010    Family History  Problem Relation Age of Onset   Brain cancer Mother    CAD Father    Alcoholism Father    CAD Brother    Lung cancer Brother    CAD Brother    Social History   Socioeconomic History   Marital status: Widowed  Spouse name: Not on file   Number of children: Not on file   Years of education: Not on file   Highest education level: Not on file  Occupational History   Not on file  Tobacco Use   Smoking status: Former    Current packs/day: 0.00    Average packs/day: 1 pack/day for 20.0 years (20.0 ttl pk-yrs)    Types: Cigarettes    Start date: 01/06/1947    Quit date: 01/06/1967    Years since quitting: 56.9   Smokeless tobacco: Never  Vaping Use   Vaping status: Never Used  Substance and Sexual Activity   Alcohol use: Yes    Comment: occassionally   Drug use: No   Sexual activity: Not on file  Other Topics Concern   Not on file  Social History Narrative   Not on file   Social Drivers of Health   Financial Resource Strain: Low Risk  (02/20/2023)   Overall Financial Resource Strain (CARDIA)    Difficulty of Paying Living Expenses: Not hard at all  Food Insecurity: No Food Insecurity (02/20/2023)   Hunger Vital Sign    Worried About Running Out of Food in the Last Year: Never true    Ran Out of Food in the Last Year: Never true  Transportation Needs: No Transportation Needs (02/20/2023)   PRAPARE - Administrator, Civil Service (Medical): No    Lack of Transportation (Non-Medical): No  Physical Activity: Sufficiently Active (02/20/2023)   Exercise Vital Sign    Days of Exercise per Week: 5 days    Minutes of Exercise per Session: 30 min  Stress: No Stress Concern Present (02/20/2023)   [employer] of Occupational Health - Occupational Stress Questionnaire    Feeling of Stress : Not at all  Social Connections: Moderately Isolated (02/20/2023)   Social  Connection and Isolation Panel [NHANES]    Frequency of Communication with Friends and Family: Twice a week    Frequency of Social Gatherings with Friends and Family: Twice a week    Attends Religious Services: Never    Database administrator or Organizations: No    Attends Banker Meetings: Never    Marital Status: Married    Objective:  There were no vitals taken for this visit.     09/18/2023   10:53 AM 09/06/2023    7:43 AM 05/22/2023    7:36 AM  BP/Weight  Systolic BP 128 130 120  Diastolic BP 72 74 60  Wt. (Lbs) 232 231 222.6  BMI 32.36 kg/m2 32.22 kg/m2 31.05 kg/m2    Physical Exam  Diabetic Foot Exam - Simple   No data filed      Lab Results  Component Value Date   WBC 5.6 09/06/2023   HGB 15.2 09/06/2023   HCT 49.1 09/06/2023   PLT 240 09/06/2023   GLUCOSE 110 (H) 09/06/2023   CHOL 168 09/06/2023   TRIG 135 09/06/2023   HDL 45 09/06/2023   LDLCALC 99 09/06/2023   ALT 11 09/06/2023   AST 15 09/06/2023   NA 142 09/06/2023   K 4.3 09/06/2023   CL 104 09/06/2023   CREATININE 1.12 09/06/2023   BUN 18 09/06/2023   CO2 21 09/06/2023   TSH 3.800 09/09/2021   INR 1.1 02/10/2020   HGBA1C 6.0 (H) 09/06/2023      Assessment & Plan:  Essential hypertension, benign  Aortic atherosclerosis (HCC)  Chronic bronchitis, unspecified chronic bronchitis type (HCC)  GERD without  esophagitis  Mixed hyperlipidemia  Prediabetes     No orders of the defined types were placed in this encounter.   No orders of the defined types were placed in this encounter.    Follow-up: No follow-ups on file.   I,Marla I Leal-Borjas,acting as a scribe for Mercy Stall, MD.,have documented all relevant documentation on the behalf of Mercy Stall, MD,as directed by  Mercy Stall, MD while in the presence of Mercy Stall, MD.   An After Visit Summary was printed and given to the patient.  Mercy Stall, MD [facility] [phone]

## 2023-12-12 ENCOUNTER — Encounter: Payer: Self-pay | Admitting: Family Medicine

## 2023-12-12 ENCOUNTER — Ambulatory Visit (INDEPENDENT_AMBULATORY_CARE_PROVIDER_SITE_OTHER): Payer: No Typology Code available for payment source | Admitting: Family Medicine

## 2023-12-12 VITALS — BP 120/78 | HR 71 | Temp 97.5°F | Ht 71.0 in | Wt 224.2 lb

## 2023-12-12 DIAGNOSIS — J42 Unspecified chronic bronchitis: Secondary | ICD-10-CM | POA: Diagnosis not present

## 2023-12-12 DIAGNOSIS — I700 Atherosclerosis of aorta: Secondary | ICD-10-CM | POA: Diagnosis not present

## 2023-12-12 DIAGNOSIS — E119 Type 2 diabetes mellitus without complications: Secondary | ICD-10-CM

## 2023-12-12 DIAGNOSIS — Z6831 Body mass index (BMI) 31.0-31.9, adult: Secondary | ICD-10-CM

## 2023-12-12 DIAGNOSIS — E782 Mixed hyperlipidemia: Secondary | ICD-10-CM

## 2023-12-12 DIAGNOSIS — R7303 Prediabetes: Secondary | ICD-10-CM | POA: Diagnosis not present

## 2023-12-12 DIAGNOSIS — I10 Essential (primary) hypertension: Secondary | ICD-10-CM

## 2023-12-12 DIAGNOSIS — K219 Gastro-esophageal reflux disease without esophagitis: Secondary | ICD-10-CM | POA: Diagnosis not present

## 2023-12-12 HISTORY — DX: Type 2 diabetes mellitus without complications: E11.9

## 2023-12-12 NOTE — Assessment & Plan Note (Signed)
 Well controlled.  No changes to medicines. Continue crestor 40 mg 1 pill daily, zetia  10 mg before bed, and Gemfibrozil 600 mg twice a day Continue to work on eating a healthy diet and exercise.  Labs drawn today.

## 2023-12-12 NOTE — Assessment & Plan Note (Signed)
 Control: good Recommend check feet daily. Recommend annual eye exams. Medicines: continue synjardy ER 12.12/998 mg once daily. Continue to work on eating a healthy diet and exercise.  Labs drawn today.

## 2023-12-12 NOTE — Assessment & Plan Note (Signed)
 The current medical regimen is effective;  continue present plan and medications. omeprazole 20 mg daily.

## 2023-12-12 NOTE — Assessment & Plan Note (Signed)
 Recommend continue to work on eating healthy diet and exercise.

## 2023-12-12 NOTE — Assessment & Plan Note (Signed)
 Await labs/testing for assessment and recommendations. No changes to medicines. Continue crestor 40 mg daily, zetia  10 mg daily,  fish oil 1000 mg twice daily and lopid 600 mg twice daily.   Continue to work on eating a healthy diet and exercise.

## 2023-12-12 NOTE — Assessment & Plan Note (Signed)
 The current medical regimen is effective;  continue present plan and medications. Continue wixela one puff twice daily and albuterol  inhaler as needed.

## 2023-12-12 NOTE — Assessment & Plan Note (Signed)
 Well controlled.  No changes to medicines. Continue Losartan 100 mg once daily, Metoprolol 25 mg twice a day. Continue to work on eating a healthy diet and exercise.

## 2023-12-13 LAB — LIPID PANEL
Chol/HDL Ratio: 3.1 ratio (ref 0.0–5.0)
Cholesterol, Total: 120 mg/dL (ref 100–199)
HDL: 39 mg/dL — ABNORMAL LOW (ref >39)
LDL Chol Calc (NIH): 60 mg/dL (ref 0–99)
Triglycerides: 114 mg/dL (ref 0–149)
VLDL Cholesterol Cal: 21 mg/dL (ref 5–40)

## 2023-12-13 LAB — CBC WITH DIFFERENTIAL/PLATELET
Basophils Absolute: 0.1 10*3/uL (ref 0.0–0.2)
Basos: 1 %
EOS (ABSOLUTE): 0.3 10*3/uL (ref 0.0–0.4)
Eos: 6 %
Hematocrit: 51.1 % — ABNORMAL HIGH (ref 37.5–51.0)
Hemoglobin: 16.2 g/dL (ref 13.0–17.7)
Immature Grans (Abs): 0 10*3/uL (ref 0.0–0.1)
Immature Granulocytes: 0 %
Lymphocytes Absolute: 1 10*3/uL (ref 0.7–3.1)
Lymphs: 21 %
MCH: 27 pg (ref 26.6–33.0)
MCHC: 31.7 g/dL (ref 31.5–35.7)
MCV: 85 fL (ref 79–97)
Monocytes Absolute: 0.6 10*3/uL (ref 0.1–0.9)
Monocytes: 12 %
Neutrophils Absolute: 2.9 10*3/uL (ref 1.4–7.0)
Neutrophils: 60 %
Platelets: 205 10*3/uL (ref 150–450)
RBC: 6.01 x10E6/uL — ABNORMAL HIGH (ref 4.14–5.80)
RDW: 15.8 % — ABNORMAL HIGH (ref 11.6–15.4)
WBC: 4.8 10*3/uL (ref 3.4–10.8)

## 2023-12-13 LAB — HEMOGLOBIN A1C
Est. average glucose Bld gHb Est-mCnc: 128 mg/dL
Hgb A1c MFr Bld: 6.1 % — ABNORMAL HIGH (ref 4.8–5.6)

## 2023-12-13 LAB — COMPREHENSIVE METABOLIC PANEL WITH GFR
ALT: 8 IU/L (ref 0–44)
AST: 12 IU/L (ref 0–40)
Albumin: 4.1 g/dL (ref 3.7–4.7)
Alkaline Phosphatase: 52 IU/L (ref 44–121)
BUN/Creatinine Ratio: 22 (ref 10–24)
BUN: 24 mg/dL (ref 8–27)
Bilirubin Total: 0.6 mg/dL (ref 0.0–1.2)
CO2: 18 mmol/L — ABNORMAL LOW (ref 20–29)
Calcium: 9.4 mg/dL (ref 8.6–10.2)
Chloride: 108 mmol/L — ABNORMAL HIGH (ref 96–106)
Creatinine, Ser: 1.11 mg/dL (ref 0.76–1.27)
Globulin, Total: 2.4 g/dL (ref 1.5–4.5)
Glucose: 89 mg/dL (ref 70–99)
Potassium: 4.4 mmol/L (ref 3.5–5.2)
Sodium: 141 mmol/L (ref 134–144)
Total Protein: 6.5 g/dL (ref 6.0–8.5)
eGFR: 65 mL/min/{1.73_m2} (ref >59)

## 2024-01-25 DIAGNOSIS — L570 Actinic keratosis: Secondary | ICD-10-CM | POA: Diagnosis not present

## 2024-01-25 DIAGNOSIS — C44319 Basal cell carcinoma of skin of other parts of face: Secondary | ICD-10-CM | POA: Diagnosis not present

## 2024-02-26 ENCOUNTER — Ambulatory Visit (INDEPENDENT_AMBULATORY_CARE_PROVIDER_SITE_OTHER): Admitting: Family Medicine

## 2024-02-26 ENCOUNTER — Encounter: Payer: Self-pay | Admitting: Family Medicine

## 2024-02-26 VITALS — BP 124/60 | HR 85 | Temp 98.7°F | Resp 18 | Ht 71.0 in | Wt 227.2 lb

## 2024-02-26 DIAGNOSIS — L304 Erythema intertrigo: Secondary | ICD-10-CM | POA: Diagnosis not present

## 2024-02-26 DIAGNOSIS — Z0000 Encounter for general adult medical examination without abnormal findings: Secondary | ICD-10-CM | POA: Insufficient documentation

## 2024-02-26 DIAGNOSIS — M10072 Idiopathic gout, left ankle and foot: Secondary | ICD-10-CM | POA: Diagnosis not present

## 2024-02-26 HISTORY — DX: Encounter for general adult medical examination without abnormal findings: Z00.00

## 2024-02-26 MED ORDER — COLCHICINE 0.6 MG PO TABS
0.6000 mg | ORAL_TABLET | Freq: Every day | ORAL | 1 refills | Status: AC
Start: 2024-02-26 — End: ?

## 2024-02-26 MED ORDER — CLOTRIMAZOLE-BETAMETHASONE 1-0.05 % EX CREA: 1.0000 | TOPICAL_CREAM | Freq: Two times a day (BID) | CUTANEOUS | 0 refills | Status: AC

## 2024-02-26 NOTE — Assessment & Plan Note (Signed)
 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 - Prescribe Lotrimin  cream for application to affected area as needed.

## 2024-02-26 NOTE — Progress Notes (Signed)
 Subjective:   Christian Lowe. is a 83 y.o. male who presents for Medicare Annual/Subsequent preventive examination.  Visit Complete: In person  Patient Medicare AWV questionnaire was completed by the patient on 02/26/2024; I have confirmed that all information answered by patient is correct and no changes since this date.        Objective:    Today's Vitals   02/26/24 0838 02/26/24 0844  BP: 124/60   Pulse: 85   Resp: 18   Temp: 98.7 F (37.1 C)   TempSrc: Temporal   SpO2: 98%   Weight: 227 lb 3.2 oz (103.1 kg)   Height: 5' 11 (1.803 m)   PainSc: 3  3   PainLoc: Knee    Body mass index is 31.69 kg/m.     02/11/2020    1:00 AM 12/30/2019    3:29 PM 01/10/2016    8:00 AM  Advanced Directives  Does Patient Have a Medical Advance Directive? No Yes Yes   Type of Special educational needs teacher of [place];Living will   Does patient want to make changes to medical advance directive?  Yes (MAU/Ambulatory/Procedural Areas - Information given)   Copy of Healthcare Power of Attorney in Chart?  No - copy requested No - copy requested   Would patient like information on creating a medical advance directive? No - Patient declined       Data saved with a previous flowsheet row definition    Current Medications (verified) Outpatient Encounter Medications as of 02/26/2024  Medication Sig   albuterol  (VENTOLIN  HFA) 108 (90 Base) MCG/ACT inhaler Inhale 2 puffs into the lungs every 6 (six) hours as needed for wheezing or shortness of breath.   cetirizine  (ZYRTEC ) 10 MG tablet Take 1 tablet (10 mg total) by mouth daily.   clotrimazole -betamethasone  (LOTRISONE ) cream Apply 1 Application topically 2 (two) times daily.   Empagliflozin-metFORMIN  HCl ER 12.12-998 MG TB24 Take 1 tablet by mouth daily.   ezetimibe  (ZETIA ) 10 MG tablet Take 10 mg by mouth daily.   fluticasone  (FLONASE ) 50 MCG/ACT nasal spray Place 2 sprays into both nostrils daily.   fluticasone -salmeterol (WIXELA  INHUB) 250-50 MCG/ACT AEPB Inhale 1 puff into the lungs in the morning and at bedtime.   gemfibrozil (LOPID) 600 MG tablet Take 600 mg by mouth 2 (two) times daily before a meal.   losartan  (COZAAR ) 100 MG tablet Take 1 tablet (100 mg total) by mouth daily.   metoprolol  tartrate (LOPRESSOR ) 25 MG tablet TAKE 1 TABLET BY MOUTH TWICE A DAY   Mirabegron (MYRBETRIQ PO) Take 25 tablets by mouth daily.   naproxen sodium (ANAPROX) 550 MG tablet Take 550 mg by mouth 2 (two) times daily.   Omega 3 1000 MG CAPS Take 2 capsules (2,000 mg total) by mouth daily.   omeprazole (PRILOSEC) 20 MG capsule Take 20 mg by mouth daily.   rosuvastatin (CRESTOR) 40 MG tablet Take 40 mg by mouth daily.   tadalafil (CIALIS) 10 MG tablet Take 10 mg by mouth daily as needed for erectile dysfunction.   No facility-administered encounter medications on file as of 02/26/2024.    Allergies (verified) Atorvastatin, Codeine, Niacin, Piroxicam, Pravastatin, Statins, and Zocor [simvastatin]   History: Past Medical History:  Diagnosis Date   Actinic keratosis 10/31/2020   Benign prostatic hyperplasia 10/31/2020   Mar 13, 2014 Entered By: RANDY PURCHASE B Comment: s/p TURP 2010   Body mass index (BMI) 33.0-33.9, adult 11/23/2020   BPPV (benign paroxysmal positional vertigo)  Calculus of kidney    Calculus of kidney    Cancer (HCC) 2015   skin cancer removed off left forearm    Cataract extraction status 10/31/2020   Apr 17, 2009 Entered By: ADELAIDA PIZZA Comment: bilateral   Class 2 severe obesity due to excess calories with serious comorbidity and body mass index (BMI) of 35.0 to 35.9 in adult [facility]) 06/10/2021   Closed fracture of body of sternum 11/10/2019   Contusion of right thigh 11/10/2019   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/2022   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   Encounter for fitting and adjustment of hearing aid 03/22/2021   Encounter for immunization 06/02/2022    Epidermoid cyst of skin 10/31/2020   Fatigue 11/23/2020   Gastroesophageal reflux disease 10/31/2020   GERD (gastroesophageal reflux disease)    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   Hemorrhage of gastrointestinal tract 10/31/2020   History of basal cell carcinoma (BCC)    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   Joint pain 11/23/2020   Long-term current use of testosterone  replacement therapy 11/23/2020   Low libido 11/23/2020   Mixed hyperlipidemia    Myalgia due to statin 12/14/2021   Need for immunization against influenza 06/10/2021   Osteoarthritis    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   Pain in limb 09/01/2022   Polyp of colon 11/01/2020   Aug 31, 2015 Entered By: RANDY PURCHASE B Comment: colonoscopy 05/2015 - needs repeat ASAP   Prediabetes    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   Shortness of breath 09/01/2022   Sleep disturbance 11/23/2020   Status post craniotomy 02/10/2020   Stiffness of unspecified hand, not elsewhere classified 06/02/2022   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   Urge incontinence 11/23/2020   Urge incontinence of urine 11/23/2020   Vitamin D  deficiency    Past Surgical History:  Procedure Laterality Date   CRANIOTOMY Left 02/10/2020   Procedure: CRANIOTOMY HEMATOMA EVACUATION SUBDURAL;  Surgeon: Louis Shove, MD;  Location: [facility] OR;  Service: Neurosurgery;  Laterality: Left;   EYE SURGERY Bilateral 2012   cataracts w lens implants   PROSTATECTOMY  2008   BPH   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   TEMPORAL ARTERY BIOPSY / LIGATION Bilateral 1997   negative   TRANSURETHRAL RESECTION OF PROSTATE  2010   Family History  Problem Relation Age of Onset   Brain cancer Mother    CAD Father    Alcoholism Father    CAD Brother    Lung cancer Brother    CAD Brother    Social History   Socioeconomic History   Marital status: Widowed    Spouse name: Not on file   Number of children: Not on file   Years of education: Not on file    Highest education level: Not on file  Occupational History   Not on file  Tobacco Use   Smoking status: Former    Current packs/day: 0.00    Average packs/day: 1 pack/day for 20.0 years (20.0 ttl pk-yrs)    Types: Cigarettes    Start date: 01/06/1947    Quit date: 01/06/1967    Years since quitting: 57.1   Smokeless tobacco: Never  Vaping Use   Vaping status: Never Used  Substance and Sexual Activity   Alcohol use: Yes    Comment: occassionally   Drug use: No   Sexual activity: Not on file  Other Topics Concern   Not on file  Social History Narrative  Not on file   Social Drivers of Health   Financial Resource Strain: Low Risk  (02/20/2023)   Overall Financial Resource Strain (CARDIA)    Difficulty of Paying Living Expenses: Not hard at all  Food Insecurity: No Food Insecurity (02/26/2024)   Hunger Vital Sign    Worried About Running Out of Food in the Last Year: Never true    Ran Out of Food in the Last Year: Never true  Transportation Needs: No Transportation Needs (02/26/2024)   PRAPARE - Administrator, Civil Service (Medical): No    Lack of Transportation (Non-Medical): No  Physical Activity: Sufficiently Active (02/20/2023)   Exercise Vital Sign    Days of Exercise per Week: 5 days    Minutes of Exercise per Session: 30 min  Stress: No Stress Concern Present (02/20/2023)   [employer] of Occupational Health - Occupational Stress Questionnaire    Feeling of Stress : Not at all  Social Connections: Moderately Isolated (02/20/2023)   Social Connection and Isolation Panel    Frequency of Communication with Friends and Family: Twice a week    Frequency of Social Gatherings with Friends and Family: Twice a week    Attends Religious Services: Never    Database administrator or Organizations: No    Attends Engineer, structural: Never    Marital Status: Married    Tobacco Counseling Counseling given: Not Answered   Clinical Intake:  Pre-visit  preparation completed: Yes  Pain Score: 3      BMI - recorded: 31.69 Nutritional Status: BMI > 30  Obese Diabetes: Yes CBG done?: No Did pt. bring in CBG monitor from home?: No  How often do you need to have someone help you when you read instructions, pamphlets, or other written materials from your doctor or pharmacy?: 1 - Never What is the last grade level you completed in school?: college  Interpreter Needed?: No      Activities of Daily Living     No data to display          Patient Care Team: Sherre Clapper, MD as PCP - General (Family Medicine) Nyle Rankin POUR, [facility] (Inactive) as Pharmacist (Pharmacist) [facility], [place]  Indicate any recent Medical Services you may have received from other than [facility] providers in the past year (date may be approximate).     Assessment:   This is a routine wellness examination for [employer].  Hearing/Vision screen No results found.   Goals Addressed             This Visit's Progress    Exercise 3x per week (30 min per time)   On track    02/26/24  AWV Goal: Exercise for General Health  Patient will verbalize understanding of the benefits of increased physical activity: Exercising regularly is important. It will improve your overall fitness, flexibility, and endurance. Regular exercise also will improve your overall health. It can help you control your weight, reduce stress, and improve your bone density. Over the next year, patient will increase physical activity as tolerated with a goal of at least 150 minutes of moderate physical activity per week.  You can tell that you are exercising at a moderate intensity if your heart starts beating faster and you start breathing faster but can still hold a conversation. Moderate-intensity exercise ideas include: Walking 1 mile (1.6 km) in about 15 minutes Biking Hiking Golfing Dancing Water aerobics Patient will verbalize understanding of everyday activities that  increase  physical activity by providing examples like the following: Yard work, such as: Insurance underwriter Gardening Washing windows or floors Patient will be able to explain general safety guidelines for exercising:  Before you start a new exercise program, talk with your health care provider. Do not exercise so much that you hurt yourself, feel dizzy, or get very short of breath. Wear comfortable clothes and wear shoes with good support. Drink plenty of water while you exercise to prevent dehydration or heat stroke. Work out until your breathing and your heartbeat get faster.      Prevent falls   On track    02/26/24  AWV Goal: Fall Prevention  Over the next year, patient will decrease their risk for falls by: Using assistive devices, such as a cane or walker, as needed Identifying fall risks within their home and correcting them by: Removing throw rugs Adding handrails to stairs or ramps Removing clutter and keeping a clear pathway throughout the home Increasing light, especially at night Adding shower handles/bars Raising toilet seat Identifying potential personal risk factors for falls: Medication side effects Incontinence/urgency Vestibular dysfunction Hearing loss Musculoskeletal disorders Neurological disorders Orthostatic hypotension         Depression Screen    02/26/2024    8:55 AM 12/12/2023    7:33 AM 09/18/2023   10:55 AM 05/22/2023    7:41 AM 02/20/2023   10:45 AM 01/29/2023    7:35 AM 10/05/2022    3:05 PM  PHQ 2/9 Scores  PHQ - 2 Score 0 0 0 0 0 0 0  PHQ- 9 Score    0 1 1     Fall Risk    02/26/2024    8:53 AM 12/12/2023    7:33 AM 09/18/2023   10:55 AM 05/22/2023    7:41 AM 02/20/2023   10:47 AM  Fall Risk   Falls in the past year? 0 0 0 1 0  Number falls in past yr: 0 0 0 0 0  Injury with Fall? 0 0 0 0 0  Risk for fall due to : No Fall Risks No Fall Risks No Fall Risks  History of fall(s) No Fall Risks  Follow up Falls evaluation completed   Falls evaluation completed;Follow up appointment Falls evaluation completed;Education provided    MEDICARE RISK AT HOME: Medicare Risk at Home Any stairs in or around the home?: Yes If so, are there any without handrails?: Yes Home free of loose throw rugs in walkways, pet beds, electrical cords, etc?: Yes Adequate lighting in your home to reduce risk of falls?: Yes Life alert?: No Use of a cane, walker or w/c?: No Grab bars in the bathroom?: Yes Shower chair or bench in shower?: Yes Elevated toilet seat or a handicapped toilet?: Yes  TIMED UP AND GO:  Was the test performed?  Yes  Length of time to ambulate 10 feet: 7 sec Gait steady and fast without use of assistive device    Cognitive Function:        02/26/2024    8:56 AM 02/20/2023   10:49 AM 12/29/2021    2:53 PM 12/30/2019    3:32 PM  6CIT Screen  What Year? 0 points 0 points 0 points 0 points  What month? 0 points 0 points 0 points 0 points  What time? 0 points 0 points 0 points 0 points  Count back from 20 0 points 0 points  0 points 0 points  Months in reverse 0 points 0 points 0 points 0 points  Repeat phrase 0 points 0 points 0 points 0 points  Total Score 0 points 0 points 0 points 0 points    Immunizations Immunization History  Administered Date(s) Administered   Fluad Quad(high Dose 65+) 04/08/2020, 06/09/2021, 07/06/2022   Fluad Trivalent(High Dose 65+) 05/22/2023   Influenza Split 05/08/2015, 04/26/2016   Influenza, High Dose Seasonal PF 05/07/2017   Influenza, Seasonal, Injecte, Preservative Fre 05/05/2015   Influenza-Unspecified 06/07/2001, 05/07/2002, 05/07/2004, 06/07/2006, 06/08/2007, 05/25/2008, 05/16/2009, 05/07/2010, 05/07/2013, 04/07/2014, 05/21/2021, 06/21/2022   Moderna Covid-19 Vaccine Bivalent Booster 43yrs & up 11/28/2021   PFIZER Comirnaty(Gray Top)Covid-19 Tri-Sucrose Vaccine 12/15/2020   PFIZER(Purple  Top)SARS-COV-2 Vaccination 07/28/2020, 08/19/2020   PNEUMOCOCCAL CONJUGATE-20 10/10/2021   Pneumococcal Conjugate-13 09/24/2014, 10/04/2016   Pneumococcal Polysaccharide-23 06/07/2005, 05/16/2017, 04/17/2018   Rsv, Mab, [place], 0.5 Ml, Neonate To 24 Mos(Beyfortus) 01/15/2023   Td 04/07/2018   Tdap 03/13/2014, 04/17/2018   Zoster Recombinant(Shingrix) 07/18/2021, 09/20/2021   Zoster, Live 07/28/2013, 09/24/2014    TDAP status: Up to date  Flu Vaccine status: Up to date  Pneumococcal vaccine status: Up to date  Covid-19 vaccine status: Declined, Education has been provided regarding the importance of this vaccine but patient still declined. Advised may receive this vaccine at local pharmacy or Health Dept.or vaccine clinic. Aware to provide a copy of the vaccination record if obtained from local pharmacy or Health Dept. Verbalized acceptance and understanding.  Qualifies for Shingles Vaccine? Yes   Zostavax completed No   Shingrix Completed?: Yes  Screening Tests Health Maintenance  Topic Date Due   COVID-19 Vaccine (5 - 2024-25 season) 05/07/2024 (Originally 04/08/2023)   INFLUENZA VACCINE  03/07/2024   OPHTHALMOLOGY EXAM  05/13/2024   HEMOGLOBIN A1C  06/13/2024   Diabetic kidney evaluation - Urine ACR  09/05/2024   Diabetic kidney evaluation - eGFR measurement  12/11/2024   FOOT EXAM  12/11/2024   Medicare Annual Wellness (AWV)  02/25/2025   DTaP/Tdap/Td (4 - Td or Tdap) 04/17/2028   Pneumococcal Vaccine: 50+ Years  Completed   Zoster Vaccines- Shingrix  Completed   Hepatitis B Vaccines  Aged Out   HPV VACCINES  Aged Out   Meningococcal B Vaccine  Aged Out    Health Maintenance  There are no preventive care reminders to display for this patient.   Colorectal cancer screening: No longer required.   Lung Cancer Screening: (Low Dose CT Chest recommended if Age 21-80 years, 20 pack-year currently smoking OR have quit w/in 15years.) does not qualify.   Lung Cancer  Screening Referral: N/A  Additional Screening:  Hepatitis C Screening: does not qualify; Completed N/A  Vision Screening: Recommended annual ophthalmology exams for early detection of glaucoma and other disorders of the eye. Is the patient up to date with their annual eye exam?  Yes  Who is the provider or what is the name of the office in which the patient attends annual eye exams? [facility] If pt is not established with a provider, would they like to be referred to a provider to establish care? No .   Dental Screening: Recommended annual dental exams for proper oral hygiene  Diabetic Foot Exam: Diabetic Foot Exam: Completed 12/12/2023  Community Resource Referral / Chronic Care Management: CRR required this visit?  No   CCM required this visit?  No     Plan:    Encounter for Medicare annual wellness exam Assessment & Plan: General Health Maintenance  Annual wellness visit completed. Screenings and vaccinations up to date. Discussed exercise and fall prevention. - Encourage continuation of yard work as exercise. - Discuss additional exercise options: walking, biking, hiking, dancing, water aerobics.  Things to do to keep yourself healthy  - Exercise at least 30-45 minutes a day, 3-4 days a week.  - Eat a low-fat diet with lots of fruits and vegetables, up to 7-9 servings per day.  - Seatbelts can save your life. Wear them always.  - Smoke detectors on every level of your home, check batteries every year.  - Eye Doctor - have an eye exam every 1-2 years  - Alcohol -  If you drink, do it moderately, less than 2 drinks per day.  - Health Care Power of Attorney. Choose someone to speak for you if you are not able.  - Depression is common in our stressful world.If you're feeling down or losing interest in things you normally enjoy, please come in for a visit.  - Violence - If anyone is threatening or hurting you, please call immediately.    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 - Prescribe Lotrimin  cream for application to affected area as needed.  Orders: -     Clotrimazole -Betamethasone ; Apply 1 Application topically 2 (two) times daily.  Dispense: 45 g; Refill: 0  Acute idiopathic gout involving toe of left foot Assessment & Plan: Intermittent flare-ups managed with colchicine . Recent episode resolved with medication. - Prescribe colchicine  0.6 mg: 1.2 mg at flare onset, 0.6 mg one hour later, then 0.6 mg daily for up to three days if needed.  Orders: -     Colchicine ; Take 1 tablet (0.6 mg total) by mouth daily.  Dispense: 30 tablet; Refill: 1         I have personally reviewed and noted the following in the patient's chart:   Medical and social history Use of alcohol, tobacco or illicit drugs  Current medications and supplements including opioid prescriptions. Patient is not currently taking opioid prescriptions. Functional ability and status Nutritional status Physical activity Advanced directives List of other physicians Hospitalizations, surgeries, and ER visits in previous 12 months Vitals Screenings to include cognitive, depression, and falls Referrals and appointments  In addition, I have reviewed and discussed with patient certain preventive protocols, quality metrics, and best practice recommendations. A written personalized care plan for preventive services as well as general preventive health recommendations were provided to patient.   02/26/2024   Harrie Cedar, FNP [facility] [phone]     After Visit Summary: (In Person-Printed) AVS printed and given to the patient

## 2024-02-26 NOTE — Assessment & Plan Note (Signed)
 General Health Maintenance Annual wellness visit completed. Screenings and vaccinations up to date. Discussed exercise and fall prevention. - Encourage continuation of yard work as exercise. - Discuss additional exercise options: walking, biking, hiking, dancing, water aerobics.  Things to do to keep yourself healthy  - Exercise at least 30-45 minutes a day, 3-4 days a week.  - Eat a low-fat diet with lots of fruits and vegetables, up to 7-9 servings per day.  - Seatbelts can save your life. Wear them always.  - Smoke detectors on every level of your home, check batteries every year.  - Eye Doctor - have an eye exam every 1-2 years  - Alcohol -  If you drink, do it moderately, less than 2 drinks per day.  - Health Care Power of Attorney. Choose someone to speak for you if you are not able.  - Depression is common in our stressful world.If you're feeling down or losing interest in things you normally enjoy, please come in for a visit.  - Violence - If anyone is threatening or hurting you, please call immediately.

## 2024-02-26 NOTE — Assessment & Plan Note (Signed)
 Intermittent flare-ups managed with colchicine . Recent episode resolved with medication. - Prescribe colchicine  0.6 mg: 1.2 mg at flare onset, 0.6 mg one hour later, then 0.6 mg daily for up to three days if needed.

## 2024-03-14 ENCOUNTER — Ambulatory Visit (INDEPENDENT_AMBULATORY_CARE_PROVIDER_SITE_OTHER): Admitting: Family Medicine

## 2024-03-14 ENCOUNTER — Encounter: Payer: Self-pay | Admitting: Family Medicine

## 2024-03-14 VITALS — BP 120/70 | HR 70 | Temp 97.3°F | Resp 16 | Ht 71.0 in | Wt 230.0 lb

## 2024-03-14 DIAGNOSIS — E119 Type 2 diabetes mellitus without complications: Secondary | ICD-10-CM

## 2024-03-14 DIAGNOSIS — N3941 Urge incontinence: Secondary | ICD-10-CM

## 2024-03-14 DIAGNOSIS — K219 Gastro-esophageal reflux disease without esophagitis: Secondary | ICD-10-CM | POA: Diagnosis not present

## 2024-03-14 DIAGNOSIS — Z6832 Body mass index (BMI) 32.0-32.9, adult: Secondary | ICD-10-CM

## 2024-03-14 DIAGNOSIS — E782 Mixed hyperlipidemia: Secondary | ICD-10-CM | POA: Diagnosis not present

## 2024-03-14 DIAGNOSIS — I10 Essential (primary) hypertension: Secondary | ICD-10-CM

## 2024-03-14 DIAGNOSIS — J42 Unspecified chronic bronchitis: Secondary | ICD-10-CM

## 2024-03-14 DIAGNOSIS — E6609 Other obesity due to excess calories: Secondary | ICD-10-CM | POA: Diagnosis not present

## 2024-03-14 DIAGNOSIS — E66811 Obesity, class 1: Secondary | ICD-10-CM

## 2024-03-14 MED ORDER — MIRABEGRON ER 50 MG PO TB24: 50.0000 mg | ORAL_TABLET | Freq: Every day | ORAL | 1 refills | Status: DC

## 2024-03-14 NOTE — Patient Instructions (Signed)
 Increase myrbetriq  to 50 mg daily (double up 25 mg to 2 daily.)

## 2024-03-14 NOTE — Progress Notes (Signed)
 Subjective:  Patient ID: Christian Lowe., male    DOB: 1941/05/29  Age: 83 y.o. MRN: [id number]  Chief Complaint  Patient presents with   Medical Management of Chronic Issues    Discussed the use of AI scribe software for clinical note transcription with the patient, who gave verbal consent to proceed.  History of Present Illness   Christian Lowe. [name] is an 83 year old male who presents with left knee pain.  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 On synjardy ER 12.12/998 mg once daily. Eye exam annually. Checking feet daily. No numbness on feet.   Antihypertensive and lipid-lowering therapy - Currently taking losartan  and metoprolol  for blood pressure control - Currently taking gemfibrozil, Zetia , and Crestor for cholesterol management - Has not taken fish oil for several months  Bladder urgency - Takes Myrbetriq  for bladder urgency, but medication is less effective now     COPD/Asthma: Current medications: on wixela 1 puff twice daily and albuterol  inhaler as needed. . No dyspnea. Albuterol  as needed.     03/14/2024    8:11 AM 02/26/2024    8:55 AM 12/12/2023    7:33 AM 09/18/2023   10:55 AM 05/22/2023    7:41 AM  Depression screen PHQ 2/9  Decreased Interest 0 0 0 0 0  Down, Depressed, Hopeless 0 0 0 0 0  PHQ - 2 Score 0 0 0 0 0  Altered sleeping 1    0  Tired, decreased energy 1    0  Change in appetite 0    0  Feeling bad or failure about yourself  0    0  Trouble concentrating 0    0  Moving  slowly or fidgety/restless 0    0  Suicidal thoughts 0    0  PHQ-9 Score 2    0  Difficult doing work/chores Not difficult at all    Not difficult at all        02/26/2024    8:53 AM  Fall Risk   Falls in the past year? 0  Number falls in past yr: 0  Injury with Fall? 0  Risk for fall due to : No Fall Risks  Follow up Falls evaluation completed    Patient Care Team: Sherre Clapper, MD as PCP - General (Family Medicine) Nyle Rankin POUR, [facility] (Inactive) as Pharmacist (Pharmacist) [facility] [place], [place]   Review of Systems  Constitutional:  Negative for chills, fatigue and fever.  HENT:  Negative for congestion, ear pain and sore throat.   Respiratory:  Negative for cough and shortness of breath.   Cardiovascular:  Negative for chest pain.  Gastrointestinal:  Negative for abdominal pain, constipation, diarrhea, nausea and vomiting.  Endocrine: Negative for polydipsia, polyphagia and polyuria.  Genitourinary:  Negative for dysuria and frequency.  Musculoskeletal:  Positive for arthralgias. Negative for myalgias.  Neurological:  Negative for dizziness and headaches.  Psychiatric/Behavioral:  Negative for dysphoric mood.        No dysphoria  Current Outpatient Medications on File Prior to Visit  Medication Sig Dispense Refill   albuterol  (VENTOLIN  HFA) 108 (90 Base) MCG/ACT inhaler Inhale 2 puffs into the lungs every 6 (six) hours as needed for wheezing or shortness of breath. 8 g 2   cetirizine  (ZYRTEC ) 10 MG tablet Take 1 tablet (10 mg total) by mouth daily. 100 tablet 0   clotrimazole -betamethasone  (LOTRISONE ) cream Apply 1 Application topically 2 (two) times daily. 45 g 0   colchicine  0.6 MG tablet Take 1 tablet (0.6 mg total) by mouth daily. 30 tablet 1   Empagliflozin-metFORMIN  HCl ER 12.12-998 MG TB24 Take 1 tablet by mouth daily.     ezetimibe  (ZETIA ) 10 MG tablet Take 10 mg by mouth daily.     fluticasone  (FLONASE ) 50 MCG/ACT nasal spray Place 2 sprays into both nostrils  daily. 16 g 6   fluticasone -salmeterol (WIXELA INHUB) 250-50 MCG/ACT AEPB Inhale 1 puff into the lungs in the morning and at bedtime. 3 each 1   gemfibrozil (LOPID) 600 MG tablet Take 600 mg by mouth 2 (two) times daily before a meal.     losartan  (COZAAR ) 100 MG tablet Take 1 tablet (100 mg total) by mouth daily. 90 tablet 1   metoprolol  tartrate (LOPRESSOR ) 25 MG tablet TAKE 1 TABLET BY MOUTH TWICE A DAY 180 tablet 2   naproxen sodium (ANAPROX) 550 MG tablet Take 550 mg by mouth 2 (two) times daily.     omeprazole (PRILOSEC) 20 MG capsule Take 20 mg by mouth daily.     rosuvastatin (CRESTOR) 40 MG tablet Take 40 mg by mouth daily.     tadalafil (CIALIS) 10 MG tablet Take 10 mg by mouth daily as needed for erectile dysfunction.     Omega 3 1000 MG CAPS Take 2 capsules (2,000 mg total) by mouth daily. (Patient not taking: Reported on 03/14/2024) 180 capsule 1   No current facility-administered medications on file prior to visit.   Past Medical History:  Diagnosis Date   Actinic keratosis 10/31/2020   Benign prostatic hyperplasia 10/31/2020   Mar 13, 2014 Entered By: RANDY PURCHASE B Comment: s/p TURP 2010   Body mass index (BMI) 33.0-33.9, adult 11/23/2020   BPPV (benign paroxysmal positional vertigo)    Calculus of kidney    Calculus of kidney    Cancer (HCC) 2015   skin cancer removed off left forearm    Cataract extraction status 10/31/2020   Apr 17, 2009 Entered By: ADELAIDA PIZZA Comment: bilateral   Class 2 severe obesity due to excess calories with serious comorbidity and body mass index (BMI) of 35.0 to 35.9 in adult [facility]) 06/10/2021   Closed fracture of body of sternum 11/10/2019   Contusion of right thigh 11/10/2019   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/2022   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   Encounter for fitting and adjustment of hearing aid 03/22/2021   Encounter for immunization 06/02/2022   Epidermoid cyst of skin 10/31/2020    Fatigue 11/23/2020   Gastroesophageal reflux disease 10/31/2020   GERD (gastroesophageal reflux disease)    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   Hemorrhage of gastrointestinal tract 10/31/2020   History of basal cell carcinoma (BCC)    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   Joint pain 11/23/2020   Long-term current use of testosterone  replacement therapy 11/23/2020   Low libido 11/23/2020   Mixed hyperlipidemia    Myalgia due to statin 12/14/2021   Need for immunization against influenza 06/10/2021   Osteoarthritis    Osteoarthritis  of right knee 10/31/2020   Pain in limb 09/01/2022   Polyp of colon 11/01/2020   Aug 31, 2015 Entered By: RANDY PURCHASE B Comment: colonoscopy 05/2015 - needs repeat ASAP   Prediabetes    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   Shortness of breath 09/01/2022   Sleep disturbance 11/23/2020   Status post craniotomy 02/10/2020   Stiffness of unspecified hand, not elsewhere classified 06/02/2022   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   Urge incontinence 11/23/2020   Urge incontinence of urine 11/23/2020   Vitamin D  deficiency    Past Surgical History:  Procedure Laterality Date   CRANIOTOMY Left 02/10/2020   Procedure: CRANIOTOMY HEMATOMA EVACUATION SUBDURAL;  Surgeon: Louis Shove, MD;  Location: [facility] OR;  Service: Neurosurgery;  Laterality: Left;   EYE SURGERY Bilateral 2012   cataracts w lens implants   PROSTATECTOMY  2008   BPH   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   TEMPORAL ARTERY BIOPSY / LIGATION Bilateral 1997   negative   TRANSURETHRAL RESECTION OF PROSTATE  2010    Family History  Problem Relation Age of Onset   Brain cancer Mother    CAD Father    Alcoholism Father    CAD Brother    Lung cancer Brother    CAD Brother    Social History   Socioeconomic History   Marital status: Widowed    Spouse name: Not on file   Number of children: Not on file   Years of education: Not on file   Highest education level: Not on file   Occupational History   Not on file  Tobacco Use   Smoking status: Former    Current packs/day: 0.00    Average packs/day: 1 pack/day for 20.0 years (20.0 ttl pk-yrs)    Types: Cigarettes    Start date: 01/06/1947    Quit date: 01/06/1967    Years since quitting: 57.2   Smokeless tobacco: Never  Vaping Use   Vaping status: Never Used  Substance and Sexual Activity   Alcohol use: Yes    Comment: occassionally   Drug use: No   Sexual activity: Yes    Partners: Female  Other Topics Concern   Not on file  Social History Narrative   Not on file   Social Drivers of Health   Financial Resource Strain: Low Risk  (02/20/2023)   Overall Financial Resource Strain (CARDIA)    Difficulty of Paying Living Expenses: Not hard at all  Food Insecurity: No Food Insecurity (02/26/2024)   Hunger Vital Sign    Worried About Running Out of Food in the Last Year: Never true    Ran Out of Food in the Last Year: Never true  Transportation Needs: No Transportation Needs (02/26/2024)   PRAPARE - Administrator, Civil Service (Medical): No    Lack of Transportation (Non-Medical): No  Physical Activity: Sufficiently Active (02/20/2023)   Exercise Vital Sign    Days of Exercise per Week: 5 days    Minutes of Exercise per Session: 30 min  Stress: No Stress Concern Present (02/20/2023)   [employer] of Occupational Health - Occupational Stress Questionnaire    Feeling of Stress : Not at all  Social Connections: Moderately Isolated (02/20/2023)   Social Connection and Isolation Panel    Frequency of Communication with Friends and Family: Twice a week    Frequency of Social Gatherings with Friends and Family: Twice a week    Attends Religious Services:  Never    Active Member of Clubs or Organizations: No    Attends Banker Meetings: Never    Marital Status: Married    Objective:  BP 120/70   Pulse 70   Temp (!) 97.3 F (36.3 C)   Resp 16   Ht 5' 11 (1.803 m)   Wt 230  lb (104.3 kg)   SpO2 95%   BMI 32.08 kg/m      03/14/2024    7:59 AM 02/26/2024    8:38 AM 12/12/2023    7:31 AM  BP/Weight  Systolic BP 120 124 120  Diastolic BP 70 60 78  Wt. (Lbs) 230 227.2 224.2  BMI 32.08 kg/m2 31.69 kg/m2 31.27 kg/m2    Physical Exam Vitals reviewed.  Constitutional:      Appearance: Normal appearance. He is obese.  Neck:     Vascular: No carotid bruit.  Cardiovascular:     Rate and Rhythm: Normal rate and regular rhythm.     Pulses: Normal pulses.     Heart sounds: Normal heart sounds.  Pulmonary:     Effort: Pulmonary effort is normal.     Breath sounds: Normal breath sounds. No wheezing, rhonchi or rales.  Abdominal:     General: Bowel sounds are normal.     Palpations: Abdomen is soft.     Tenderness: There is no abdominal tenderness.  Neurological:     Mental Status: He is alert.  Psychiatric:        Mood and Affect: Mood normal.        Behavior: Behavior normal.      Diabetic foot exam was performed with the following findings:   No deformities, ulcerations, or other skin breakdown Normal sensation of 10g monofilament Intact posterior tibialis and dorsalis pedis pulses      Lab Results  Component Value Date   WBC 5.3 03/14/2024   HGB 16.4 03/14/2024   HCT 49.7 03/14/2024   PLT 194 03/14/2024   GLUCOSE 102 (H) 03/14/2024   CHOL 159 03/14/2024   TRIG 225 (H) 03/14/2024   HDL 38 (L) 03/14/2024   LDLCALC 83 03/14/2024   ALT 8 03/14/2024   AST 11 03/14/2024   NA 140 03/14/2024   K 4.9 03/14/2024   CL 103 03/14/2024   CREATININE 1.26 03/14/2024   BUN 24 03/14/2024   CO2 22 03/14/2024   TSH 3.800 09/09/2021   INR 1.1 02/10/2020   HGBA1C 5.8 (H) 03/14/2024      Assessment & Plan:  Essential hypertension, benign Assessment & Plan: Well controlled.  No changes to medicines. Continue Losartan  100 mg once daily, Metoprolol  25 mg twice a day. Continue to work on eating a healthy diet and exercise.  Labs drawn  today   Orders: -     CBC with Differential/Platelet -     Comprehensive metabolic panel with GFR  Mixed hyperlipidemia Assessment & Plan: Await labs/testing for assessment and recommendations. No changes to medicines. Continue crestor 40 mg daily, zetia  10 mg daily,  fish oil 1000 mg twice daily and lopid 600 mg twice daily.   Continue to work on eating a healthy diet and exercise.     Orders: -     Lipid panel  Diabetes mellitus without complication (HCC) Assessment & Plan: Control: good Recommend check feet daily. Recommend annual eye exams. Medicines: continue synjardy ER 12.12/998 mg once daily. Continue to work on eating a healthy diet and exercise.  Labs drawn today.  Orders: -     Vitamin B12 -     Hemoglobin A1c  Chronic bronchitis, unspecified chronic bronchitis type [facility]) Assessment & Plan: The current medical regimen is effective;  continue present plan and medications. Continue wixela one puff twice daily and albuterol  inhaler as needed.   GERD without esophagitis Assessment & Plan: The current medical regimen is effective;  continue present plan and medications. omeprazole 20 mg daily.    Class 1 obesity due to excess calories with serious comorbidity and body mass index (BMI) of 32.0 to 32.9 in adult Assessment & Plan: Recommend continue to work on eating healthy diet and exercise.    Urge incontinence Assessment & Plan: Increase mybetriq 50 mg daily.   Orders: -     Mirabegron  ER; Take 1 tablet (50 mg total) by mouth daily.  Dispense: 90 tablet; Refill: 1     Meds ordered this encounter  Medications   mirabegron  ER (MYRBETRIQ ) 50 MG TB24 tablet    Sig: Take 1 tablet (50 mg total) by mouth daily.    Dispense:  90 tablet    Refill:  1    Orders Placed This Encounter  Procedures   CBC with Differential/Platelet   Vitamin B12   Hemoglobin A1c   Comprehensive metabolic panel with GFR   Lipid panel     Follow-up: Return in  about 3 months (around 06/14/2024) for chronic follow up.   I,Marla I Leal-Borjas,acting as a scribe for Abigail Free, MD.,have documented all relevant documentation on the behalf of Abigail Free, MD,as directed by  Abigail Free, MD while in the presence of Abigail Free, MD.   An After Visit Summary was printed and given to the patient.  I attest that I have reviewed this visit and agree with the plan scribed by my staff.   Abigail Free, MD [facility] [phone]

## 2024-03-15 ENCOUNTER — Ambulatory Visit: Payer: Self-pay | Admitting: Family Medicine

## 2024-03-15 LAB — COMPREHENSIVE METABOLIC PANEL WITH GFR
ALT: 8 IU/L (ref 0–44)
AST: 11 IU/L (ref 0–40)
Albumin: 4.1 g/dL (ref 3.7–4.7)
Alkaline Phosphatase: 51 IU/L (ref 44–121)
BUN/Creatinine Ratio: 19 (ref 10–24)
BUN: 24 mg/dL (ref 8–27)
Bilirubin Total: 0.6 mg/dL (ref 0.0–1.2)
CO2: 22 mmol/L (ref 20–29)
Calcium: 9.6 mg/dL (ref 8.6–10.2)
Chloride: 103 mmol/L (ref 96–106)
Creatinine, Ser: 1.26 mg/dL (ref 0.76–1.27)
Globulin, Total: 2.4 g/dL (ref 1.5–4.5)
Glucose: 102 mg/dL — ABNORMAL HIGH (ref 70–99)
Potassium: 4.9 mmol/L (ref 3.5–5.2)
Sodium: 140 mmol/L (ref 134–144)
Total Protein: 6.5 g/dL (ref 6.0–8.5)
eGFR: 56 mL/min/1.73 — ABNORMAL LOW (ref >59)

## 2024-03-15 LAB — CBC WITH DIFFERENTIAL/PLATELET
Basophils Absolute: 0 x10E3/uL (ref 0.0–0.2)
Basos: 1 %
EOS (ABSOLUTE): 0.2 x10E3/uL (ref 0.0–0.4)
Eos: 5 %
Hematocrit: 49.7 % (ref 37.5–51.0)
Hemoglobin: 16.4 g/dL (ref 13.0–17.7)
Immature Grans (Abs): 0 x10E3/uL (ref 0.0–0.1)
Immature Granulocytes: 0 %
Lymphocytes Absolute: 1.2 x10E3/uL (ref 0.7–3.1)
Lymphs: 22 %
MCH: 29.6 pg (ref 26.6–33.0)
MCHC: 33 g/dL (ref 31.5–35.7)
MCV: 90 fL (ref 79–97)
Monocytes Absolute: 0.6 x10E3/uL (ref 0.1–0.9)
Monocytes: 12 %
Neutrophils Absolute: 3.2 x10E3/uL (ref 1.4–7.0)
Neutrophils: 60 %
Platelets: 194 x10E3/uL (ref 150–450)
RBC: 5.54 x10E6/uL (ref 4.14–5.80)
RDW: 15.1 % (ref 11.6–15.4)
WBC: 5.3 x10E3/uL (ref 3.4–10.8)

## 2024-03-15 LAB — LIPID PANEL
Chol/HDL Ratio: 4.2 ratio (ref 0.0–5.0)
Cholesterol, Total: 159 mg/dL (ref 100–199)
HDL: 38 mg/dL — ABNORMAL LOW (ref >39)
LDL Chol Calc (NIH): 83 mg/dL (ref 0–99)
Triglycerides: 225 mg/dL — ABNORMAL HIGH (ref 0–149)
VLDL Cholesterol Cal: 38 mg/dL (ref 5–40)

## 2024-03-15 LAB — HEMOGLOBIN A1C
Est. average glucose Bld gHb Est-mCnc: 120 mg/dL
Hgb A1c MFr Bld: 5.8 % — ABNORMAL HIGH (ref 4.8–5.6)

## 2024-03-15 LAB — VITAMIN B12: Vitamin B-12: 362 pg/mL (ref 232–1245)

## 2024-03-15 NOTE — Assessment & Plan Note (Signed)
 The current medical regimen is effective;  continue present plan and medications. omeprazole 20 mg daily.

## 2024-03-15 NOTE — Assessment & Plan Note (Signed)
 Await labs/testing for assessment and recommendations. No changes to medicines. Continue crestor 40 mg daily, zetia  10 mg daily,  fish oil 1000 mg twice daily and lopid 600 mg twice daily.   Continue to work on eating a healthy diet and exercise.

## 2024-03-15 NOTE — Assessment & Plan Note (Signed)
 The current medical regimen is effective;  continue present plan and medications. Continue wixela one puff twice daily and albuterol  inhaler as needed.

## 2024-03-15 NOTE — Assessment & Plan Note (Signed)
 Control: good Recommend check feet daily. Recommend annual eye exams. Medicines: continue synjardy ER 12.12/998 mg once daily. Continue to work on eating a healthy diet and exercise.  Labs drawn today.

## 2024-03-15 NOTE — Assessment & Plan Note (Signed)
 Well controlled.  No changes to medicines. Continue Losartan  100 mg once daily, Metoprolol  25 mg twice a day. Continue to work on eating a healthy diet and exercise.  Labs drawn today

## 2024-03-15 NOTE — Assessment & Plan Note (Signed)
 Increase mybetriq 50 mg daily.

## 2024-03-15 NOTE — Assessment & Plan Note (Signed)
 Recommend continue to work on eating healthy diet and exercise.

## 2024-06-24 NOTE — Assessment & Plan Note (Signed)
 SABRA

## 2024-06-24 NOTE — Progress Notes (Unsigned)
 Subjective:  Patient ID: Christian Lupita Claudene Mickey., male    DOB: 01-20-1941  Age: 83 y.o. MRN: [id number]  No chief complaint on file.   HPI: Discussed the use of AI scribe software for clinical note transcription with the patient, who gave verbal consent to proceed.  History of Present Illness        03/14/2024    8:11 AM 02/26/2024    8:55 AM 12/12/2023    7:33 AM 09/18/2023   10:55 AM 05/22/2023    7:41 AM  Depression screen PHQ 2/9  Decreased Interest 0 0 0 0 0  Down, Depressed, Hopeless 0 0 0 0 0  PHQ - 2 Score 0 0 0 0 0  Altered sleeping 1    0  Tired, decreased energy 1    0  Change in appetite 0    0  Feeling bad or failure about yourself  0    0  Trouble concentrating 0    0  Moving slowly or fidgety/restless 0    0  Suicidal thoughts 0    0  PHQ-9 Score 2     0   Difficult doing work/chores Not difficult at all    Not difficult at all     Data saved with a previous flowsheet row definition        02/26/2024    8:53 AM  Fall Risk   Falls in the past year? 0  Number falls in past yr: 0  Injury with Fall? 0  Risk for fall due to : No Fall Risks  Follow up Falls evaluation completed    Patient Care Team: Sherre Clapper, MD as PCP - General (Family Medicine) Nyle Rankin POUR, [facility] (Inactive) as Pharmacist (Pharmacist) [facility] [place], [place]   Review of Systems  Current Outpatient Medications on File Prior to Visit  Medication Sig Dispense Refill   albuterol  (VENTOLIN  HFA) 108 (90 Base) MCG/ACT inhaler Inhale 2 puffs into the lungs every 6 (six) hours as needed for wheezing or shortness of breath. 8 g 2   cetirizine  (ZYRTEC ) 10 MG tablet Take 1 tablet (10 mg total) by mouth daily. 100 tablet 0   clotrimazole -betamethasone  (LOTRISONE ) cream Apply 1 Application topically 2 (two) times daily. 45 g 0   colchicine  0.6 MG tablet Take 1 tablet (0.6 mg total) by mouth daily. 30 tablet 1   Empagliflozin-metFORMIN  HCl ER 12.12-998 MG TB24 Take 1 tablet by mouth daily.      ezetimibe  (ZETIA ) 10 MG tablet Take 10 mg by mouth daily.     fluticasone  (FLONASE ) 50 MCG/ACT nasal spray Place 2 sprays into both nostrils daily. 16 g 6   fluticasone -salmeterol (WIXELA INHUB) 250-50 MCG/ACT AEPB Inhale 1 puff into the lungs in the morning and at bedtime. 3 each 1   gemfibrozil (LOPID) 600 MG tablet Take 600 mg by mouth 2 (two) times daily before a meal.     losartan  (COZAAR ) 100 MG tablet Take 1 tablet (100 mg total) by mouth daily. 90 tablet 1   metoprolol  tartrate (LOPRESSOR ) 25 MG tablet TAKE 1 TABLET BY MOUTH TWICE A DAY 180 tablet 2   mirabegron  ER (MYRBETRIQ ) 50 MG TB24 tablet Take 1 tablet (50 mg total) by mouth daily. 90 tablet 1   naproxen sodium (ANAPROX) 550 MG tablet Take 550 mg by mouth 2 (two) times daily.     Omega 3 1000 MG CAPS Take 2 capsules (2,000 mg total) by mouth daily. (Patient not taking: Reported  on 03/14/2024) 180 capsule 1   omeprazole (PRILOSEC) 20 MG capsule Take 20 mg by mouth daily.     rosuvastatin (CRESTOR) 40 MG tablet Take 40 mg by mouth daily.     tadalafil (CIALIS) 10 MG tablet Take 10 mg by mouth daily as needed for erectile dysfunction.     No current facility-administered medications on file prior to visit.   Past Medical History:  Diagnosis Date   Actinic keratosis 10/31/2020   Benign prostatic hyperplasia 10/31/2020   Mar 13, 2014 Entered By: RANDY PURCHASE B Comment: s/p TURP 2010   Body mass index (BMI) 33.0-33.9, adult 11/23/2020   BPPV (benign paroxysmal positional vertigo)    Calculus of kidney    Calculus of kidney    Cancer (HCC) 2015   skin cancer removed off left forearm    Cataract extraction status 10/31/2020   Apr 17, 2009 Entered By: ADELAIDA PIZZA Comment: bilateral   Class 2 severe obesity due to excess calories with serious comorbidity and body mass index (BMI) of 35.0 to 35.9 in adult 06/10/2021   Closed fracture of body of sternum 11/10/2019   Contusion of right thigh 11/10/2019   Cyclic citrullinated peptide (CCP)  antibody positive 06/02/2022   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   Encounter for fitting and adjustment of hearing aid 03/22/2021   Encounter for immunization 06/02/2022   Epidermoid cyst of skin 10/31/2020   Fatigue 11/23/2020   Gastroesophageal reflux disease 10/31/2020   GERD (gastroesophageal reflux disease)    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   Hemorrhage of gastrointestinal tract 10/31/2020   History of basal cell carcinoma (BCC)    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   Joint pain 11/23/2020   Long-term current use of testosterone  replacement therapy 11/23/2020   Low libido 11/23/2020   Mixed hyperlipidemia    Myalgia due to statin 12/14/2021   Need for immunization against influenza 06/10/2021   Osteoarthritis    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   Pain in limb 09/01/2022   Polyp of colon 11/01/2020   Aug 31, 2015 Entered By: RANDY PURCHASE B Comment: colonoscopy 05/2015 - needs repeat ASAP   Prediabetes    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   Shortness of breath 09/01/2022   Sleep disturbance 11/23/2020   Status post craniotomy 02/10/2020   Stiffness of unspecified hand, not elsewhere classified 06/02/2022   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   Urge incontinence 11/23/2020   Urge incontinence of urine 11/23/2020   Vitamin D  deficiency    Past Surgical History:  Procedure Laterality Date   CRANIOTOMY Left 02/10/2020   Procedure: CRANIOTOMY HEMATOMA EVACUATION SUBDURAL;  Surgeon: Louis Shove, MD;  Location: [facility] OR;  Service: Neurosurgery;  Laterality: Left;   EYE SURGERY Bilateral 2012   cataracts w lens implants   PROSTATECTOMY  2008   BPH   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   TEMPORAL ARTERY BIOPSY / LIGATION Bilateral 1997   negative   TRANSURETHRAL RESECTION OF PROSTATE  2010    Family History  Problem Relation Age of Onset   Brain cancer Mother    CAD Father    Alcoholism Father    CAD Brother    Lung cancer  Brother    CAD Brother    Social History   Socioeconomic History   Marital status: Widowed    Spouse name: Not on file   Number of children: Not on file   Years of education: Not on file   Highest education level:  Not on file  Occupational History   Not on file  Tobacco Use   Smoking status: Former    Current packs/day: 0.00    Average packs/day: 1 pack/day for 20.0 years (20.0 ttl pk-yrs)    Types: Cigarettes    Start date: 01/06/1947    Quit date: 01/06/1967    Years since quitting: 57.5   Smokeless tobacco: Never  Vaping Use   Vaping status: Never Used  Substance and Sexual Activity   Alcohol use: Yes    Comment: occassionally   Drug use: No   Sexual activity: Yes    Partners: Female  Other Topics Concern   Not on file  Social History Narrative   Not on file   Social Drivers of Health   Financial Resource Strain: Low Risk  (02/20/2023)   Overall Financial Resource Strain (CARDIA)    Difficulty of Paying Living Expenses: Not hard at all  Food Insecurity: No Food Insecurity (02/26/2024)   Hunger Vital Sign    Worried About Running Out of Food in the Last Year: Never true    Ran Out of Food in the Last Year: Never true  Transportation Needs: No Transportation Needs (02/26/2024)   PRAPARE - Administrator, Civil Service (Medical): No    Lack of Transportation (Non-Medical): No  Physical Activity: Sufficiently Active (02/20/2023)   Exercise Vital Sign    Days of Exercise per Week: 5 days    Minutes of Exercise per Session: 30 min  Stress: No Stress Concern Present (02/20/2023)   [employer] of Occupational Health - Occupational Stress Questionnaire    Feeling of Stress : Not at all  Social Connections: Moderately Isolated (02/20/2023)   Social Connection and Isolation Panel    Frequency of Communication with Friends and Family: Twice a week    Frequency of Social Gatherings with Friends and Family: Twice a week    Attends Religious Services: Never     Database Administrator or Organizations: No    Attends Banker Meetings: Never    Marital Status: Married    Objective:  There were no vitals taken for this visit.     03/14/2024    7:59 AM 02/26/2024    8:38 AM 12/12/2023    7:31 AM  BP/Weight  Systolic BP 120 124 120  Diastolic BP 70 60 78  Wt. (Lbs) 230 227.2 224.2  BMI 32.08 kg/m2 31.69 kg/m2 31.27 kg/m2    Physical Exam  {Perform Simple Foot Exam  Perform Detailed exam:1} {Insert foot Exam (Optional):30965}   Lab Results  Component Value Date   WBC 5.3 03/14/2024   HGB 16.4 03/14/2024   HCT 49.7 03/14/2024   PLT 194 03/14/2024   GLUCOSE 102 (H) 03/14/2024   CHOL 159 03/14/2024   TRIG 225 (H) 03/14/2024   HDL 38 (L) 03/14/2024   LDLCALC 83 03/14/2024   ALT 8 03/14/2024   AST 11 03/14/2024   NA 140 03/14/2024   K 4.9 03/14/2024   CL 103 03/14/2024   CREATININE 1.26 03/14/2024   BUN 24 03/14/2024   CO2 22 03/14/2024   TSH 3.800 09/09/2021   INR 1.1 02/10/2020   HGBA1C 5.8 (H) 03/14/2024    Results for orders placed or performed in visit on 03/14/24  CBC with Differential/Platelet   Collection Time: 03/14/24  8:34 AM  Result Value Ref Range   WBC 5.3 3.4 - 10.8 x10E3/uL   RBC 5.54 4.14 - 5.80 x10E6/uL   Hemoglobin  16.4 13.0 - 17.7 g/dL   Hematocrit 50.2 62.4 - 51.0 %   MCV 90 79 - 97 fL   MCH 29.6 26.6 - 33.0 pg   MCHC 33.0 31.5 - 35.7 g/dL   RDW 84.8 88.3 - 84.5 %   Platelets 194 150 - 450 x10E3/uL   Neutrophils 60 Not Estab. %   Lymphs 22 Not Estab. %   Monocytes 12 Not Estab. %   Eos 5 Not Estab. %   Basos 1 Not Estab. %   Neutrophils Absolute 3.2 1.4 - 7.0 x10E3/uL   Lymphocytes Absolute 1.2 0.7 - 3.1 x10E3/uL   Monocytes Absolute 0.6 0.1 - 0.9 x10E3/uL   EOS (ABSOLUTE) 0.2 0.0 - 0.4 x10E3/uL   Basophils Absolute 0.0 0.0 - 0.2 x10E3/uL   Immature Granulocytes 0 Not Estab. %   Immature Grans (Abs) 0.0 0.0 - 0.1 x10E3/uL  Vitamin B12   Collection Time: 03/14/24  8:34 AM  Result  Value Ref Range   Vitamin B-12 362 232 - 1,245 pg/mL  Hemoglobin A1c   Collection Time: 03/14/24  8:34 AM  Result Value Ref Range   Hgb A1c MFr Bld 5.8 (H) 4.8 - 5.6 %   Est. average glucose Bld gHb Est-mCnc 120 mg/dL  Comprehensive metabolic panel with GFR   Collection Time: 03/14/24  8:34 AM  Result Value Ref Range   Glucose 102 (H) 70 - 99 mg/dL   BUN 24 8 - 27 mg/dL   Creatinine, Ser 8.73 0.76 - 1.27 mg/dL   eGFR 56 (L) >40 fO/fpw/8.26   BUN/Creatinine Ratio 19 10 - 24   Sodium 140 134 - 144 mmol/L   Potassium 4.9 3.5 - 5.2 mmol/L   Chloride 103 96 - 106 mmol/L   CO2 22 20 - 29 mmol/L   Calcium 9.6 8.6 - 10.2 mg/dL   Total Protein 6.5 6.0 - 8.5 g/dL   Albumin  4.1 3.7 - 4.7 g/dL   Globulin, Total 2.4 1.5 - 4.5 g/dL   Bilirubin Total 0.6 0.0 - 1.2 mg/dL   Alkaline Phosphatase 51 44 - 121 IU/L   AST 11 0 - 40 IU/L   ALT 8 0 - 44 IU/L  Lipid panel   Collection Time: 03/14/24  8:34 AM  Result Value Ref Range   Cholesterol, Total 159 100 - 199 mg/dL   Triglycerides 774 (H) 0 - 149 mg/dL   HDL 38 (L) >60 mg/dL   VLDL Cholesterol Cal 38 5 - 40 mg/dL   LDL Chol Calc (NIH) 83 0 - 99 mg/dL   Chol/HDL Ratio 4.2 0.0 - 5.0 ratio  .  Assessment & Plan:   Assessment & Plan Essential hypertension, benign     Mixed hyperlipidemia     Diabetic nephropathy associated with type 2 diabetes mellitus (HCC)       There is no height or weight on file to calculate BMI.  Assessment and Plan Assessment & Plan      No orders of the defined types were placed in this encounter.   No orders of the defined types were placed in this encounter.      Follow-up: No follow-ups on file.  An After Visit Summary was printed and given to the patient.  Christian Free, MD [facility] [phone]

## 2024-06-25 ENCOUNTER — Ambulatory Visit (INDEPENDENT_AMBULATORY_CARE_PROVIDER_SITE_OTHER): Admitting: Family Medicine

## 2024-06-25 ENCOUNTER — Encounter: Payer: Self-pay | Admitting: Family Medicine

## 2024-06-25 VITALS — BP 132/74 | HR 74 | Temp 98.0°F | Ht 71.0 in | Wt 234.0 lb

## 2024-06-25 DIAGNOSIS — R040 Epistaxis: Secondary | ICD-10-CM

## 2024-06-25 DIAGNOSIS — E1121 Type 2 diabetes mellitus with diabetic nephropathy: Secondary | ICD-10-CM

## 2024-06-25 DIAGNOSIS — I10 Essential (primary) hypertension: Secondary | ICD-10-CM

## 2024-06-25 DIAGNOSIS — Z23 Encounter for immunization: Secondary | ICD-10-CM

## 2024-06-25 DIAGNOSIS — E782 Mixed hyperlipidemia: Secondary | ICD-10-CM

## 2024-06-25 DIAGNOSIS — M1712 Unilateral primary osteoarthritis, left knee: Secondary | ICD-10-CM

## 2024-06-25 HISTORY — DX: Epistaxis: R04.0

## 2024-06-25 LAB — POCT GLYCOSYLATED HEMOGLOBIN (HGB A1C): HbA1c POC (<> result, manual entry): 5.8 % (ref 4.0–5.6)

## 2024-06-25 LAB — POCT LIPID PANEL
HDL: 27
LDL: 44
Non-HDL: 83
TC: 110
TRG: 197

## 2024-06-25 NOTE — Assessment & Plan Note (Signed)
 Nasal saline. Consider holding flonase  for 1-2 weeks if recurs.

## 2024-06-27 ENCOUNTER — Ambulatory Visit: Payer: Self-pay

## 2024-06-27 NOTE — Telephone Encounter (Signed)
 FYI Only or Action Required?: Action required by provider: request for appointment.  Patient was last seen in primary care on 06/25/2024 by Sherre Clapper, MD.  Called Nurse Triage reporting Knee Pain.  Symptoms began several years ago.  Interventions attempted: Nothing.  --  Copied from CRM #[id number]. Topic: Clinical - Red Word Triage >> Jun 27, 2024  8:20 AM Christian Lowe wrote: Red Word that prompted transfer to Nurse Triage: Patient said he is experiencing severe knee pain once he stands and starts walking. Requesting a cortisone shot. Reason for Disposition  Knee pain  Answer Assessment - Initial Assessment Questions Patient disconnected the call and refused to continue triage.    1. LOCATION and RADIATION: Where is the pain located?      Left Knee  2. QUALITY: What does the pain feel like?  (e.g., sharp, dull, aching, burning)     Pain during Walking  3. SEVERITY: How bad is the pain? What does it keep you from doing?   (Scale 1-10; or mild, moderate, severe)     Pain when walking  4. ONSET: When did the pain start? Does it come and go, or is it there all the time?      Chronic  5. RECURRENT: Have you had this pain before? If Yes, ask: When, and what happened then?     - 6. SETTING: Has there been any recent work, exercise or other activity that involved that part of the body?      - 7. AGGRAVATING FACTORS: What makes the knee pain worse? (e.g., walking, climbing stairs, running)     - 8. ASSOCIATED SYMPTOMS: Is there any swelling or redness of the knee?     - 9. OTHER SYMPTOMS: Do you have any other symptoms? (e.g., calf pain, chest pain, difficulty breathing, fever)     - 10. PREGNANCY: Is there any chance you are pregnant? When was your last menstrual period?       -  Protocols used: Knee Pain-A-AH

## 2024-06-27 NOTE — Telephone Encounter (Signed)
 Left message for patient to return call for an appointment

## 2024-06-30 ENCOUNTER — Ambulatory Visit

## 2024-06-30 VITALS — BP 138/64 | HR 87 | Temp 99.0°F | Resp 18 | Ht 71.0 in | Wt 239.8 lb

## 2024-06-30 DIAGNOSIS — G8929 Other chronic pain: Secondary | ICD-10-CM

## 2024-06-30 DIAGNOSIS — M25562 Pain in left knee: Secondary | ICD-10-CM

## 2024-06-30 DIAGNOSIS — M1712 Unilateral primary osteoarthritis, left knee: Secondary | ICD-10-CM | POA: Diagnosis not present

## 2024-06-30 NOTE — Progress Notes (Signed)
 Acute Office Visit  Subjective:    Patient ID: Christian Lowe., male    DOB: Jun 28, 1941, 83 y.o.   MRN: [id number]  Chief Complaint  Patient presents with   Knee Pain    HPI:   Discussed the use of AI scribe software for clinical note transcription with the patient, who gave verbal consent to proceed.  History of Present Illness   Christian Lowe. Christian Lowe is an 83 year old male who presents with left knee pain.  Left knee pain - Chronic pain for several years, primarily localized to the medial aspect of the left knee - Pain is exacerbated by pressure and activity, especially walking - Pain can be severe enough to disrupt sleep - Pain is absent when sitting still - Received prior steroid injections with temporary relief - Naproxen prescribed approximately one year ago, used as needed for pain control - X-ray performed approximately four months ago; informed that knee replacement is needed - First informed about the need for left knee replacement approximately ten years ago, but has not undergone the procedure  Right knee arthroplasty history - History of right knee replacement surgery approximately ten years ago - At the time of right knee surgery, left knee was noted to be in similar condition, though asymptomatic then        Past Medical History:  Diagnosis Date   Actinic keratosis 10/31/2020   Benign prostatic hyperplasia 10/31/2020   Mar 13, 2014 Entered By: RANDY PURCHASE B Comment: s/p TURP 2010   Body mass index (BMI) 33.0-33.9, adult 11/23/2020   BPPV (benign paroxysmal positional vertigo)    Calculus of kidney    Calculus of kidney    Cancer (HCC) 2015   skin cancer removed off left forearm    Cataract extraction status 10/31/2020   Apr 17, 2009 Entered By: ADELAIDA PIZZA Comment: bilateral   Class 2 severe obesity due to excess calories with serious comorbidity and body mass index (BMI) of 35.0 to 35.9 in adult 06/10/2021   Closed fracture of  body of sternum 11/10/2019   Contusion of right thigh 11/10/2019   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/2022   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   Encounter for fitting and adjustment of hearing aid 03/22/2021   Encounter for immunization 06/02/2022   Epidermoid cyst of skin 10/31/2020   Fatigue 11/23/2020   Gastroesophageal reflux disease 10/31/2020   GERD (gastroesophageal reflux disease)    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   Hemorrhage of gastrointestinal tract 10/31/2020   History of basal cell carcinoma (BCC)    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   Joint pain 11/23/2020   Long-term current use of testosterone  replacement therapy 11/23/2020   Low libido 11/23/2020   Mixed hyperlipidemia    Myalgia due to statin 12/14/2021   Need for immunization against influenza 06/10/2021   Osteoarthritis    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   Pain in limb 09/01/2022   Polyp of colon 11/01/2020   Aug 31, 2015 Entered By: RANDY PURCHASE B Comment: colonoscopy 05/2015 - needs repeat ASAP   Prediabetes    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   Shortness of breath 09/01/2022   Sleep disturbance 11/23/2020   Status post craniotomy 02/10/2020   Stiffness of unspecified hand, not elsewhere classified 06/02/2022   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   Urge incontinence 11/23/2020   Urge incontinence of urine 11/23/2020   Vitamin D  deficiency     Past Surgical History:  Procedure Laterality Date   CRANIOTOMY Left 02/10/2020   Procedure: CRANIOTOMY HEMATOMA EVACUATION SUBDURAL;  Surgeon: Louis Shove, MD;  Location: [facility];  Service: Neurosurgery;  Laterality: Left;   EYE SURGERY Bilateral 2012   cataracts w lens implants   PROSTATECTOMY  2008   BPH   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   TEMPORAL ARTERY BIOPSY / LIGATION Bilateral 1997   negative   TRANSURETHRAL RESECTION OF PROSTATE  2010    Family History  Problem Relation Age  of Onset   Brain cancer Mother    CAD Father    Alcoholism Father    CAD Brother    Lung cancer Brother    CAD Brother     Social History   Socioeconomic History   Marital status: Widowed    Spouse name: Not on file   Number of children: Not on file   Years of education: Not on file   Highest education level: Not on file  Occupational History   Not on file  Tobacco Use   Smoking status: Former    Current packs/day: 0.00    Average packs/day: 1 pack/day for 20.0 years (20.0 ttl pk-yrs)    Types: Cigarettes    Start date: 01/06/1947    Quit date: 01/06/1967    Years since quitting: 57.5   Smokeless tobacco: Never  Vaping Use   Vaping status: Never Used  Substance and Sexual Activity   Alcohol use: Yes    Comment: occassionally   Drug use: No   Sexual activity: Yes    Partners: Female  Other Topics Concern   Not on file  Social History Narrative   Not on file   Social Drivers of Health   Financial Resource Strain: Low Risk  (02/20/2023)   Overall Financial Resource Strain (CARDIA)    Difficulty of Paying Living Expenses: Not hard at all  Food Insecurity: No Food Insecurity (02/26/2024)   Hunger Vital Sign    Worried About Running Out of Food in the Last Year: Never true    Ran Out of Food in the Last Year: Never true  Transportation Needs: No Transportation Needs (02/26/2024)   PRAPARE - Administrator, Civil Service (Medical): No    Lack of Transportation (Non-Medical): No  Physical Activity: Sufficiently Active (02/20/2023)   Exercise Vital Sign    Days of Exercise per Week: 5 days    Minutes of Exercise per Session: 30 min  Stress: No Stress Concern Present (02/20/2023)   [employer] of Occupational Health - Occupational Stress Questionnaire    Feeling of Stress : Not at all  Social Connections: Moderately Isolated (02/20/2023)   Social Connection and Isolation Panel    Frequency of Communication with Friends and Family: Twice a week    Frequency  of Social Gatherings with Friends and Family: Twice a week    Attends Religious Services: Never    Database Administrator or Organizations: No    Attends Banker Meetings: Never    Marital Status: Married  Catering Manager Violence: Not At Risk (02/26/2024)   Humiliation, Afraid, Rape, and Kick questionnaire    Fear of Current or Ex-Partner: No    Emotionally Abused: No    Physically Abused: No    Sexually Abused: No    Outpatient Medications Prior to Visit  Medication Sig Dispense Refill   albuterol  (VENTOLIN  HFA) 108 (90 Base) MCG/ACT inhaler Inhale 2 puffs into the lungs every 6 (six) hours as needed  for wheezing or shortness of breath. 8 g 2   cetirizine  (ZYRTEC ) 10 MG tablet Take 1 tablet (10 mg total) by mouth daily. 100 tablet 0   clotrimazole -betamethasone  (LOTRISONE ) cream Apply 1 Application topically 2 (two) times daily. 45 g 0   colchicine  0.6 MG tablet Take 1 tablet (0.6 mg total) by mouth daily. 30 tablet 1   Empagliflozin-metFORMIN  HCl ER 12.12-998 MG TB24 Take 1 tablet by mouth daily.     ezetimibe  (ZETIA ) 10 MG tablet Take 10 mg by mouth daily.     fluticasone  (FLONASE ) 50 MCG/ACT nasal spray Place 2 sprays into both nostrils daily. 16 g 6   fluticasone -salmeterol (WIXELA INHUB) 250-50 MCG/ACT AEPB Inhale 1 puff into the lungs in the morning and at bedtime. 3 each 1   gemfibrozil (LOPID) 600 MG tablet Take 600 mg by mouth 2 (two) times daily before a meal.     losartan  (COZAAR ) 100 MG tablet Take 1 tablet (100 mg total) by mouth daily. 90 tablet 1   metoprolol  tartrate (LOPRESSOR ) 25 MG tablet TAKE 1 TABLET BY MOUTH TWICE A DAY 180 tablet 2   mirabegron  ER (MYRBETRIQ ) 50 MG TB24 tablet Take 1 tablet (50 mg total) by mouth daily. 90 tablet 1   naproxen sodium (ANAPROX) 550 MG tablet Take 550 mg by mouth 2 (two) times daily.     omeprazole (PRILOSEC) 20 MG capsule Take 20 mg by mouth daily.     rosuvastatin (CRESTOR) 40 MG tablet Take 40 mg by mouth daily.      tadalafil (CIALIS) 10 MG tablet Take 10 mg by mouth daily as needed for erectile dysfunction.     No facility-administered medications prior to visit.    Allergies  Allergen Reactions   Atorvastatin Other (See Comments)    Other reaction(s): Muscle pain   Codeine Other (See Comments)   Niacin Itching    Other reaction(s): Itching, Flushing   Piroxicam     Other reaction(s): Gastric ulcer with hemorrhage   Pravastatin     Other reaction(s): Muscle pain   Statins     myalgias   Zocor [Simvastatin]     Other reaction(s): Muscle pain    Review of Systems  Constitutional:  Negative for appetite change, fatigue and fever.  HENT:  Negative for congestion, ear pain, sinus pressure and sore throat.   Eyes: Negative.   Respiratory:  Negative for cough, chest tightness, shortness of breath and wheezing.   Cardiovascular:  Negative for chest pain and palpitations.  Gastrointestinal:  Negative for abdominal pain, constipation, diarrhea, nausea and vomiting.  Endocrine: Negative.   Genitourinary:  Negative for dysuria, frequency, hematuria and urgency.  Musculoskeletal:  Positive for arthralgias (left knee pain). Negative for back pain, joint swelling and myalgias.  Skin:  Negative for rash.  Allergic/Immunologic: Negative.   Neurological:  Negative for dizziness, weakness, light-headedness and headaches.  Psychiatric/Behavioral:  Negative for dysphoric mood. The patient is not nervous/anxious.        Objective:        06/30/2024    9:33 AM 06/25/2024    8:01 AM 03/14/2024    7:59 AM  Vitals with BMI  Height 5' 11 5' 11 5' 11  Weight 239 lbs 13 oz 234 lbs 230 lbs  BMI 33.46 32.65 32.09  Systolic 138 132 879  Diastolic 64 74 70  Pulse 87 74 70    No data found.   Physical Exam Vitals and nursing note reviewed.  Constitutional:  Appearance: He is obese.  HENT:     Head: Normocephalic and atraumatic.  Musculoskeletal:        General: Tenderness (medial aspect of  left knee) present.     Comments: Crepitus left knee  Neurological:     Mental Status: He is alert.   Joint Injection/Arthrocentesis  Date/Time: 06/30/2024 10:15 AM  Performed by: Catlin Aycock, MD Authorized by: Marshella Tello, MD  Indications: pain  Body area: knee Joint: left knee Local anesthesia used: yes  Anesthesia: Local anesthesia used: yes Local Anesthetic: lidocaine  2% without epinephrine  and lidocaine  spray Anesthetic total: 4 mL  Sedation: Patient sedated: no  Preparation: Patient was prepped and draped in the usual sterile fashion. Needle size: 22 G Ultrasound guidance: no Approach: lateral Triamcinolone  amount: 80 mg Patient tolerance: patient tolerated the procedure well with no immediate complications      Health Maintenance Due  Topic Date Due   OPHTHALMOLOGY EXAM  05/13/2024    There are no preventive care reminders to display for this patient.   Lab Results  Component Value Date   TSH 3.800 09/09/2021   Lab Results  Component Value Date   WBC 5.3 03/14/2024   HGB 16.4 03/14/2024   HCT 49.7 03/14/2024   MCV 90 03/14/2024   PLT 194 03/14/2024   Lab Results  Component Value Date   NA 140 03/14/2024   K 4.9 03/14/2024   CO2 22 03/14/2024   GLUCOSE 102 (H) 03/14/2024   BUN 24 03/14/2024   CREATININE 1.26 03/14/2024   BILITOT 0.6 03/14/2024   ALKPHOS 51 03/14/2024   AST 11 03/14/2024   ALT 8 03/14/2024   PROT 6.5 03/14/2024   ALBUMIN  4.1 03/14/2024   CALCIUM 9.6 03/14/2024   ANIONGAP 11 02/11/2020   EGFR 56 (L) 03/14/2024   Lab Results  Component Value Date   CHOL 159 03/14/2024   Lab Results  Component Value Date   HDL 38 (L) 03/14/2024   Lab Results  Component Value Date   LDLCALC 83 03/14/2024   Lab Results  Component Value Date   TRIG 225 (H) 03/14/2024   Lab Results  Component Value Date   CHOLHDL 4.2 03/14/2024   Lab Results  Component Value Date   HGBA1C 5.8 06/25/2024        Results for orders  placed or performed in visit on 06/25/24  OPHTHALMOLOGY REPORT-SCANNED   Collection Time: 05/11/23 12:41 PM  Result Value Ref Range   A Comment       Assessment & Plan:   Assessment & Plan Primary osteoarthritis of left knee Unilateral primary osteoarthritis of the left knee with associated pain Chronic osteoarthritis of the left knee with significant pain, particularly on the medial aspect. Previous steroid injections have provided relief. Recent x-rayat the ortho office confirmed arthritis and indicated the need for knee replacement. Pain exacerbated by activity, relieved by rest. Steroid injections may worsen arthritis but are beneficial for delaying surgery. Surgery is recommended if pain significantly affects quality of life, as it can provide more productive years. Delaying surgery may limit future surgical options due to age and condition. - Administered steroid injection TRIAMCINOLONE  80 MG/ML WITH 4 ML OF LIDOCAINE  WITHOUT EPINEPHRINE  INJECTED INTO THE LATERAL ASPECT OF the left knee for pain relief. - Advised to discuss knee replacement with orthopedic surgeon at upcoming appointment. - Continue current pain management strategies as needed.  Orders:   Joint Injection/Arthrocentesis  Chronic pain of left knee Details of the Procedure  Orders:   Joint Injection/Arthrocentesis             Body mass index is 33.45 kg/m.SABRA        No orders of the defined types were placed in this encounter.   Orders Placed This Encounter  Procedures   Joint Injection/Arthrocentesis     Follow-up: No follow-ups on file.  An After Visit Summary was printed and given to the patient.  Carvin Almas, MD [facility] [phone]

## 2024-07-02 ENCOUNTER — Ambulatory Visit: Admitting: Family Medicine

## 2024-07-28 ENCOUNTER — Encounter: Payer: Self-pay | Admitting: Physician Assistant

## 2024-07-28 ENCOUNTER — Ambulatory Visit: Admitting: Physician Assistant

## 2024-07-28 VITALS — BP 128/72 | HR 67 | Temp 97.8°F | Ht 71.0 in | Wt 227.8 lb

## 2024-07-28 DIAGNOSIS — R051 Acute cough: Secondary | ICD-10-CM

## 2024-07-28 DIAGNOSIS — U071 COVID-19: Secondary | ICD-10-CM

## 2024-07-28 DIAGNOSIS — E119 Type 2 diabetes mellitus without complications: Secondary | ICD-10-CM | POA: Diagnosis not present

## 2024-07-28 LAB — POC COVID19 BINAXNOW: SARS Coronavirus 2 Ag: POSITIVE — AB

## 2024-07-28 LAB — POCT INFLUENZA A/B
Influenza A, POC: NEGATIVE
Influenza B, POC: NEGATIVE

## 2024-07-28 LAB — POCT RESPIRATORY SYNCYTIAL VIRUS: RSV Rapid Ag: NEGATIVE

## 2024-07-28 MED ORDER — TRIAMCINOLONE ACETONIDE 40 MG/ML IJ SUSP
80.0000 mg | Freq: Once | INTRAMUSCULAR | Status: AC
  Administered 2024-07-28: 80 mg via INTRAMUSCULAR

## 2024-07-28 MED ORDER — HYDROCODONE BIT-HOMATROP MBR 5-1.5 MG/5ML PO SOLN: 5.0000 mL | Freq: Four times a day (QID) | ORAL | 0 refills | Status: AC | PRN

## 2024-07-28 NOTE — Assessment & Plan Note (Signed)
 Occasional hypoglycemic symptoms reported. No regular blood glucose monitoring. Kenalog  may increase blood glucose temporarily. - Advised monitoring blood glucose levels, especially after Kenalog  injection. - Will consider adjusting diabetes medication if hypoglycemia is confirmed.

## 2024-07-28 NOTE — Progress Notes (Signed)
 r  Acute Office Visit  Subjective:    Patient ID: Christian Galentine., male    DOB: February 14, 1941, 83 y.o.   MRN: [id number]  Chief Complaint  Patient presents with   Cough    HPI: Patient is in today for cough and sinus congestion.  Discussed the use of AI scribe software for clinical note transcription with the patient, who gave verbal consent to proceed.  History of Present Illness Christian Lowe. Christian Lowe is an 83 year old male who presents with worsening cough and sinus congestion.  His symptoms began last Tuesday with a runny nose, which he attributed to working outside in the cold wind, followed by a sore throat. He took over-the-counter medication for his throat, which provided only brief relief.  His symptoms progressed to include a persistent cough, significant nasal drainage, and sinus congestion. He reports that when he gets symptoms like this, they usually progress to his chest and he develops a cough that can last for six to eight weeks. The cough is mostly dry, with occasional productive episodes. He has a history of similar episodes where respiratory symptoms linger for extended periods.  He uses an albuterol  inhaler, although he is unsure of the specific medication. He has a history of allergy to codeine, which causes him to feel 'strange', but he can tolerate codeine in cough syrup form. He has used Tessalon Perles in the past but finds them ineffective for his cough.  He is concerned that his wife may be developing similar symptoms, as she reported a sore throat and feeling unwell. His COVID test returned positive, and he is worried about potentially spreading the virus to others, as he had a gathering at his home recently.  He has difficulty sleeping due to the cough, spending most nights in a recliner to avoid exacerbating the cough when lying down. He also experiences episodes of feeling nervous, which he attributes to low blood sugar, as consuming something  sweet alleviates the symptoms. He has a way to check his blood sugar but does not do so regularly.    Past Medical History:  Diagnosis Date   Actinic keratosis 10/31/2020   Benign prostatic hyperplasia 10/31/2020   Mar 13, 2014 Entered By: RANDY PURCHASE B Comment: s/p TURP 2010   Body mass index (BMI) 33.0-33.9, adult 11/23/2020   BPPV (benign paroxysmal positional vertigo)    Calculus of kidney    Calculus of kidney    Cancer (HCC) 2015   skin cancer removed off left forearm    Cataract extraction status 10/31/2020   Apr 17, 2009 Entered By: ADELAIDA PIZZA Comment: bilateral   Class 2 severe obesity due to excess calories with serious comorbidity and body mass index (BMI) of 35.0 to 35.9 in adult 06/10/2021   Closed fracture of body of sternum 11/10/2019   Contusion of right thigh 11/10/2019   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/2022   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   Encounter for fitting and adjustment of hearing aid 03/22/2021   Encounter for immunization 06/02/2022   Epidermoid cyst of skin 10/31/2020   Fatigue 11/23/2020   Gastroesophageal reflux disease 10/31/2020   GERD (gastroesophageal reflux disease)    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   Hemorrhage of gastrointestinal tract 10/31/2020   History of basal cell carcinoma (BCC)    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   Joint pain 11/23/2020   Long-term current use of testosterone  replacement therapy 11/23/2020   Low libido 11/23/2020   Mixed hyperlipidemia  Myalgia due to statin 12/14/2021   Need for immunization against influenza 06/10/2021   Osteoarthritis    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   Pain in limb 09/01/2022   Polyp of colon 11/01/2020   Aug 31, 2015 Entered By: RANDY PURCHASE B Comment: colonoscopy 05/2015 - needs repeat ASAP   Prediabetes    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   Shortness of breath 09/01/2022   Sleep disturbance 11/23/2020   Status post craniotomy 02/10/2020    Stiffness of unspecified hand, not elsewhere classified 06/02/2022   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   Urge incontinence 11/23/2020   Urge incontinence of urine 11/23/2020   Vitamin D  deficiency     Past Surgical History:  Procedure Laterality Date   CRANIOTOMY Left 02/10/2020   Procedure: CRANIOTOMY HEMATOMA EVACUATION SUBDURAL;  Surgeon: Louis Shove, MD;  Location: [facility] OR;  Service: Neurosurgery;  Laterality: Left;   EYE SURGERY Bilateral 2012   cataracts w lens implants   PROSTATECTOMY  2008   BPH   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   TEMPORAL ARTERY BIOPSY / LIGATION Bilateral 1997   negative   TRANSURETHRAL RESECTION OF PROSTATE  2010    Family History  Problem Relation Age of Onset   Brain cancer Mother    CAD Father    Alcoholism Father    CAD Brother    Lung cancer Brother    CAD Brother     Social History   Socioeconomic History   Marital status: Widowed    Spouse name: Not on file   Number of children: Not on file   Years of education: Not on file   Highest education level: Not on file  Occupational History   Not on file  Tobacco Use   Smoking status: Former    Current packs/day: 0.00    Average packs/day: 1 pack/day for 20.0 years (20.0 ttl pk-yrs)    Types: Cigarettes    Start date: 01/06/1947    Quit date: 01/06/1967    Years since quitting: 57.5   Smokeless tobacco: Never  Vaping Use   Vaping status: Never Used  Substance and Sexual Activity   Alcohol use: Yes    Comment: occassionally   Drug use: No   Sexual activity: Yes    Partners: Female  Other Topics Concern   Not on file  Social History Narrative   Not on file   Social Drivers of Health   Tobacco Use: Medium Risk (07/28/2024)   Patient History    Smoking Tobacco Use: Former    Smokeless Tobacco Use: Never    Passive Exposure: Not on file  Financial Resource Strain: Low Risk (02/20/2023)   Overall Financial Resource Strain (CARDIA)     Difficulty of Paying Living Expenses: Not hard at all  Food Insecurity: No Food Insecurity (02/26/2024)   Epic    Worried About Radiation Protection Practitioner of Food in the Last Year: Never true    Ran Out of Food in the Last Year: Never true  Transportation Needs: No Transportation Needs (02/26/2024)   Epic    Lack of Transportation (Medical): No    Lack of Transportation (Non-Medical): No  Physical Activity: Sufficiently Active (02/20/2023)   Exercise Vital Sign    Days of Exercise per Week: 5 days    Minutes of Exercise per Session: 30 min  Stress: No Stress Concern Present (02/20/2023)   [employer] of Occupational Health - Occupational Stress Questionnaire    Feeling of Stress :  Not at all  Social Connections: Moderately Isolated (02/20/2023)   Social Connection and Isolation Panel    Frequency of Communication with Friends and Family: Twice a week    Frequency of Social Gatherings with Friends and Family: Twice a week    Attends Religious Services: Never    Database Administrator or Organizations: No    Attends Banker Meetings: Never    Marital Status: Married  Catering Manager Violence: Not At Risk (02/26/2024)   Epic    Fear of Current or Ex-Partner: No    Emotionally Abused: No    Physically Abused: No    Sexually Abused: No  Depression (PHQ2-9): Low Risk (03/14/2024)   Depression (PHQ2-9)    PHQ-2 Score: 2  Alcohol Screen: Low Risk (02/20/2023)   Alcohol Screen    Last Alcohol Screening Score (AUDIT): 0  Housing: Low Risk (02/26/2024)   Epic    Unable to Pay for Housing in the Last Year: No    Number of Times Moved in the Last Year: 0    Homeless in the Last Year: No  Utilities: Not At Risk (02/26/2024)   Epic    Threatened with loss of utilities: No  Health Literacy: Adequate Health Literacy (02/20/2023)   B1300 Health Literacy    Frequency of need for help with medical instructions: Never    Outpatient Medications Prior to Visit  Medication Sig Dispense Refill    albuterol  (VENTOLIN  HFA) 108 (90 Base) MCG/ACT inhaler Inhale 2 puffs into the lungs every 6 (six) hours as needed for wheezing or shortness of breath. 8 g 2   cetirizine  (ZYRTEC ) 10 MG tablet Take 1 tablet (10 mg total) by mouth daily. 100 tablet 0   clotrimazole -betamethasone  (LOTRISONE ) cream Apply 1 Application topically 2 (two) times daily. 45 g 0   colchicine  0.6 MG tablet Take 1 tablet (0.6 mg total) by mouth daily. 30 tablet 1   Empagliflozin-metFORMIN  HCl ER 12.12-998 MG TB24 Take 1 tablet by mouth daily.     ezetimibe  (ZETIA ) 10 MG tablet Take 10 mg by mouth daily.     fluticasone  (FLONASE ) 50 MCG/ACT nasal spray Place 2 sprays into both nostrils daily. 16 g 6   fluticasone -salmeterol (WIXELA INHUB) 250-50 MCG/ACT AEPB Inhale 1 puff into the lungs in the morning and at bedtime. 3 each 1   gemfibrozil (LOPID) 600 MG tablet Take 600 mg by mouth 2 (two) times daily before a meal.     losartan  (COZAAR ) 100 MG tablet Take 1 tablet (100 mg total) by mouth daily. 90 tablet 1   metoprolol  tartrate (LOPRESSOR ) 25 MG tablet TAKE 1 TABLET BY MOUTH TWICE A DAY 180 tablet 2   mirabegron  ER (MYRBETRIQ ) 50 MG TB24 tablet Take 1 tablet (50 mg total) by mouth daily. 90 tablet 1   Multiple Vitamins-Minerals (PRESERVISION AREDS 2 PO) Take by mouth.     naproxen sodium (ANAPROX) 550 MG tablet Take 550 mg by mouth 2 (two) times daily.     omeprazole (PRILOSEC) 20 MG capsule Take 20 mg by mouth daily.     rosuvastatin (CRESTOR) 40 MG tablet Take 40 mg by mouth daily.     tadalafil (CIALIS) 10 MG tablet Take 10 mg by mouth daily as needed for erectile dysfunction.     No facility-administered medications prior to visit.    Allergies[1]  Review of Systems  Constitutional:  Negative for appetite change, fatigue and fever.  HENT:  Positive for congestion and rhinorrhea. Negative for  ear pain, sinus pressure and sore throat.   Respiratory:  Positive for cough. Negative for chest tightness, shortness of  breath and wheezing.   Cardiovascular:  Negative for chest pain and palpitations.  Gastrointestinal:  Negative for abdominal pain, constipation, diarrhea, nausea and vomiting.  Genitourinary:  Negative for dysuria and hematuria.  Musculoskeletal:  Negative for arthralgias, back pain, joint swelling and myalgias.  Skin:  Negative for rash.  Neurological:  Negative for dizziness, weakness and headaches.  Psychiatric/Behavioral:  Negative for dysphoric mood. The patient is not nervous/anxious.        Objective:        07/28/2024   11:36 AM 06/30/2024    9:33 AM 06/25/2024    8:01 AM  Vitals with BMI  Height 5' 11 5' 11 5' 11  Weight 227 lbs 13 oz 239 lbs 13 oz 234 lbs  BMI 31.79 33.46 32.65  Systolic 128 138 867  Diastolic 72 64 74  Pulse 67 87 74    Orthostatic VS for the past 72 hrs (Last 3 readings):  Patient Position BP Location  07/28/24 1136 Sitting Right Arm     Physical Exam Vitals reviewed.  Constitutional:      Appearance: Normal appearance.  Neck:     Vascular: No carotid bruit.  Cardiovascular:     Rate and Rhythm: Normal rate and regular rhythm.     Heart sounds: Normal heart sounds.  Pulmonary:     Effort: Pulmonary effort is normal.     Breath sounds: Wheezing present.  Abdominal:     General: Bowel sounds are normal.     Palpations: Abdomen is soft.     Tenderness: There is no abdominal tenderness.  Neurological:     Mental Status: He is alert and oriented to person, place, and time.  Psychiatric:        Mood and Affect: Mood normal.        Behavior: Behavior normal.     Health Maintenance Due  Topic Date Due   COVID-19 Vaccine (5 - 2025-26 season) 04/07/2024   OPHTHALMOLOGY EXAM  05/21/2024    There are no preventive care reminders to display for this patient.   Lab Results  Component Value Date   TSH 3.800 09/09/2021   Lab Results  Component Value Date   WBC 5.3 03/14/2024   HGB 16.4 03/14/2024   HCT 49.7 03/14/2024   MCV 90  03/14/2024   PLT 194 03/14/2024   Lab Results  Component Value Date   NA 140 03/14/2024   K 4.9 03/14/2024   CO2 22 03/14/2024   GLUCOSE 102 (H) 03/14/2024   BUN 24 03/14/2024   CREATININE 1.26 03/14/2024   BILITOT 0.6 03/14/2024   ALKPHOS 51 03/14/2024   AST 11 03/14/2024   ALT 8 03/14/2024   PROT 6.5 03/14/2024   ALBUMIN  4.1 03/14/2024   CALCIUM 9.6 03/14/2024   ANIONGAP 11 02/11/2020   EGFR 56 (L) 03/14/2024   Lab Results  Component Value Date   CHOL 159 03/14/2024   Lab Results  Component Value Date   HDL 38 (L) 03/14/2024   Lab Results  Component Value Date   LDLCALC 83 03/14/2024   Lab Results  Component Value Date   TRIG 225 (H) 03/14/2024   Lab Results  Component Value Date   CHOLHDL 4.2 03/14/2024   Lab Results  Component Value Date   HGBA1C 5.8 06/25/2024        Results for orders placed or performed in  visit on 07/28/24  POC COVID-19 BinaxNow   Collection Time: 07/28/24 11:36 AM  Result Value Ref Range   SARS Coronavirus 2 Ag Positive (A) Negative     Assessment & Plan:   Assessment & Plan Acute cough COVID test came back positive Too far out for Paxlovid Continue to use supportive treatment Orders:   POCT Influenza A/B   POC COVID-19 BinaxNow   POCT respiratory syncytial virus   triamcinolone  acetonide (KENALOG -40) injection 80 mg   HYDROcodone  bit-homatropine (HYDROMET) 5-1.5 MG/5ML syrup; Take 5 mLs by mouth every 6 (six) hours as needed for cough.  COVID-19 virus RNA test result positive at limit of detection Acute COVID-19 infection with dry cough, sinus congestion, and sore throat. Positive COVID-19 test. Symptoms align with recent strain. Possible transmission to wife. - Administered Kenalog  80 mg injection to reduce sinus congestion. - Prescribed cough syrup for nighttime use to aid sleep. - Advised wife to obtain an at-home COVID-19 test and report results. - If wife tests positive, will prescribe antiviral and cough  medicine. Orders:   triamcinolone  acetonide (KENALOG -40) injection 80 mg   HYDROcodone  bit-homatropine (HYDROMET) 5-1.5 MG/5ML syrup; Take 5 mLs by mouth every 6 (six) hours as needed for cough.  Diabetes mellitus without complication (HCC) Occasional hypoglycemic symptoms reported. No regular blood glucose monitoring. Kenalog  may increase blood glucose temporarily. - Advised monitoring blood glucose levels, especially after Kenalog  injection. - Will consider adjusting diabetes medication if hypoglycemia is confirmed.       Body mass index is 31.77 kg/m.SABRA   No orders of the defined types were placed in this encounter.   Orders Placed This Encounter  Procedures   POCT Influenza A/B   POC COVID-19 BinaxNow   POCT respiratory syncytial virus     Follow-up: No follow-ups on file.  An After Visit Summary was printed and given to the patient.    I,Lauren M Auman,acting as a neurosurgeon for [employer], [employer].,have documented all relevant documentation on the behalf of Nola Angles, PA,as directed by  Nola Angles, PA while in the presence of Nola Angles, [place].    Nola Angles, [place] [facility] [phone]     [1]  Allergies Allergen Reactions   Atorvastatin Other (See Comments)    Other reaction(s): Muscle pain   Codeine Other (See Comments)   Niacin Itching    Other reaction(s): Itching, Flushing   Piroxicam     Other reaction(s): Gastric ulcer with hemorrhage   Pravastatin     Other reaction(s): Muscle pain   Statins     myalgias   Zocor [Simvastatin]     Other reaction(s): Muscle pain

## 2024-08-27 DIAGNOSIS — R7303 Prediabetes: Secondary | ICD-10-CM | POA: Insufficient documentation

## 2024-08-27 DIAGNOSIS — M1990 Unspecified osteoarthritis, unspecified site: Secondary | ICD-10-CM | POA: Insufficient documentation

## 2024-08-28 ENCOUNTER — Ambulatory Visit: Payer: Self-pay | Attending: Cardiology | Admitting: Cardiology

## 2024-08-28 ENCOUNTER — Encounter: Payer: Self-pay | Admitting: Cardiology

## 2024-08-28 VITALS — BP 130/70 | HR 61 | Ht 71.0 in | Wt 227.0 lb

## 2024-08-28 DIAGNOSIS — I10 Essential (primary) hypertension: Secondary | ICD-10-CM | POA: Diagnosis not present

## 2024-08-28 DIAGNOSIS — J42 Unspecified chronic bronchitis: Secondary | ICD-10-CM

## 2024-08-28 DIAGNOSIS — E782 Mixed hyperlipidemia: Secondary | ICD-10-CM | POA: Diagnosis not present

## 2024-08-28 DIAGNOSIS — R0609 Other forms of dyspnea: Secondary | ICD-10-CM

## 2024-08-28 DIAGNOSIS — I493 Ventricular premature depolarization: Secondary | ICD-10-CM | POA: Diagnosis not present

## 2024-08-28 DIAGNOSIS — R7303 Prediabetes: Secondary | ICD-10-CM | POA: Diagnosis not present

## 2024-08-28 NOTE — Progress Notes (Signed)
 " Cardiology Office Note:    Date:  08/28/2024   ID:  Christian Pitts Carlito Bogert., DOB [birth year], MRN [id number]  PCP:  Sherre Clapper, MD  Cardiologist:  Lamar Fitch, MD    Referring MD: Sherre Clapper, MD   Chief Complaint  Patient presents with   Annual Exam  Doing fine  History of Present Illness:     Christian Raczka. is a 84 y.o. male past medical history significant for essential hypertension, dyslipidemia, intolerance to multiple statin he was on PCSK9 agent that led to his very low LDL however he could not afford this medication anymore and now he is back on statin he does have calcification of coronary artery that was identified on CT but since he is Completely Asymptomatic Continue Conservative Approach.  Also of Enlargement of the Aorta, Frequent Ventricular Ectopy Total Burden of 10.9% but No Sustained Arrhythmia Echocardiogram Done Thereafter Showed Low Limits of Normal Ejection Fraction.  Last Echocardiogram Done in 2024.  Comes Today to Months for Follow-Up Overall Doing Well Denies of Any Chest Pain Tightness Squeezing Pressure Burning Chest Very Active Does A Lot Of Things with No Difficulties No Dizziness No Passing out No Swelling of Lower Extremities  Past Medical History:  Diagnosis Date   Actinic keratosis 10/31/2020   Acute idiopathic gout involving toe of left foot 10/05/2022   Aortic atherosclerosis 07/10/2021   Benign prostatic hyperplasia 10/31/2020   Mar 13, 2014 Entered By: RANDY PURCHASE B Comment: s/p TURP 2010   Bilateral hand pain 06/10/2021   Body mass index (BMI) 33.0-33.9, adult 11/23/2020   BPPV (benign paroxysmal positional vertigo)    Calculus of kidney    Calculus of kidney    Cancer (HCC) 2015   skin cancer removed off left forearm    Cataract extraction status 10/31/2020   Apr 17, 2009 Entered By: ADELAIDA PIZZA Comment: bilateral   Class 1 obesity due to excess calories with serious comorbidity and body mass index (BMI) of 32.0 to  32.9 in adult 09/06/2023   Class 2 severe obesity due to excess calories with serious comorbidity and body mass index (BMI) of 35.0 to 35.9 in adult 06/10/2021   Closed fracture of body of sternum 11/10/2019   Contusion of right thigh 11/10/2019   COPD (chronic obstructive pulmonary disease) (HCC) 06/10/2021   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/2022   Decreased pulses in feet 05/22/2023   Diabetes mellitus without complication (HCC) 12/12/2023   Diabetic nephropathy (HCC)    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   Encounter for fitting and adjustment of hearing aid 03/22/2021   Encounter for immunization 06/02/2022   Encounter for Medicare annual wellness exam 02/26/2024   Epidermoid cyst of skin 10/31/2020   Epistaxis 06/25/2024   Essential hypertension, benign 12/14/2021   Fatigue 11/23/2020   GERD (gastroesophageal reflux disease)    GERD without esophagitis 10/31/2020   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   Hemorrhage of gastrointestinal tract 10/31/2020   History of basal cell carcinoma (BCC)    History of traumatic brain injury 01/24/2021   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   Intertrigo 09/06/2023   Joint pain 11/23/2020   Long-term current use of testosterone  replacement therapy 11/23/2020   Low libido 11/23/2020   Lumbar back pain 02/14/2023   Mixed hyperlipidemia    Myalgia due to statin 12/14/2021   Need for immunization against influenza 06/10/2021   Nonexudative age-related macular degeneration, bilateral, intermediate dry stage 11/22/2023   Osteoarthritis    Osteoarthritis of right knee  10/31/2020   Other hereditary corneal dystrophies, 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   Overactive bladder 09/18/2023   Pain in limb 09/01/2022   Polyp of colon 11/01/2020   Aug 31, 2015 Entered By: RANDY PURCHASE B Comment: colonoscopy 05/2015 - needs repeat ASAP   Prediabetes    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   Sensorineural hearing loss, bilateral  03/22/2021   Shortness of breath 09/01/2022   Sleep disturbance 11/23/2020   Status post craniotomy 02/10/2020   Stiffness of unspecified hand, not elsewhere classified 06/02/2022   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   Unspecified asthma, uncomplicated 09/09/2021   Aug 15, 2021 Entered By: BAXTER CHILD L Comment: Mild obstruction with significatnt responde to bronchodilators   Jan 19, 2023 Entered By: ZANARD,ROBYN K Comment: Mild obstruction with significant response to bronchodilators     Urge incontinence 11/23/2020   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   Vitamin D  deficiency     Past Surgical History:  Procedure Laterality Date   CRANIOTOMY Left 02/10/2020   Procedure: CRANIOTOMY HEMATOMA EVACUATION SUBDURAL;  Surgeon: Louis Shove, MD;  Location: [facility];  Service: Neurosurgery;  Laterality: Left;   EYE SURGERY Bilateral 2012   cataracts w lens implants   PROSTATECTOMY  2008   BPH   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   TEMPORAL ARTERY BIOPSY / LIGATION Bilateral 1997   negative   TRANSURETHRAL RESECTION OF PROSTATE  2010    Current Medications: Active Medications[1]   Allergies:   Atorvastatin, Codeine, Niacin, Piroxicam, Pravastatin, Statins, and Zocor [simvastatin]   Social History   Socioeconomic History   Marital status: Widowed    Spouse name: Not on file   Number of children: Not on file   Years of education: Not on file   Highest education level: Not on file  Occupational History   Not on file  Tobacco Use   Smoking status: Former    Current packs/day: 0.00    Average packs/day: 1 pack/day for 20.0 years (20.0 ttl pk-yrs)    Types: Cigarettes    Start date: 01/06/1947    Quit date: 01/06/1967    Years since quitting: 57.6   Smokeless tobacco: Never  Vaping Use   Vaping status: Never Used  Substance and Sexual Activity   Alcohol use: Yes    Comment: occassionally   Drug use: No   Sexual activity: Yes    Partners:  Female  Other Topics Concern   Not on file  Social History Narrative   Not on file   Social Drivers of Health   Tobacco Use: Medium Risk (08/28/2024)   Patient History    Smoking Tobacco Use: Former    Smokeless Tobacco Use: Never    Passive Exposure: Not on file  Financial Resource Strain: Low Risk (02/20/2023)   Overall Financial Resource Strain (CARDIA)    Difficulty of Paying Living Expenses: Not hard at all  Food Insecurity: No Food Insecurity (02/26/2024)   Epic    Worried About Radiation Protection Practitioner of Food in the Last Year: Never true    Ran Out of Food in the Last Year: Never true  Transportation Needs: No Transportation Needs (02/26/2024)   Epic    Lack of Transportation (Medical): No    Lack of Transportation (Non-Medical): No  Physical Activity: Sufficiently Active (02/20/2023)   Exercise Vital Sign    Days of Exercise per Week: 5 days    Minutes of Exercise per Session: 30 min  Stress: No Stress Concern Present (02/20/2023)   [employer]  [employer] - Occupational Stress Questionnaire    Feeling of Stress : Not at all  Social Connections: Moderately Isolated (02/20/2023)   Social Connection and Isolation Panel    Frequency of Communication with Friends and Family: Twice a week    Frequency of Social Gatherings with Friends and Family: Twice a week    Attends Religious Services: Never    Database Administrator or Organizations: No    Attends Banker Meetings: Never    Marital Status: Married  Depression (PHQ2-9): Low Risk (03/14/2024)   Depression (PHQ2-9)    PHQ-2 Score: 2  Alcohol Screen: Low Risk (02/20/2023)   Alcohol Screen    Last Alcohol Screening Score (AUDIT): 0  Housing: Low Risk (02/26/2024)   Epic    Unable to Pay for Housing in the Last Year: No    Number of Times Moved in the Last Year: 0    Homeless in the Last Year: No  Utilities: Not At Risk (02/26/2024)   Epic    Threatened with loss of utilities: No  Health Literacy: Adequate  Health Literacy (02/20/2023)   B1300 Health Literacy    Frequency of need for help with medical instructions: Never     Family History: The patient's family history includes Alcoholism in his father; Brain cancer in his mother; CAD in his brother, brother, and father; Lung cancer in his brother. ROS:   Please see the history of present illness.    All 14 point review of systems negative except as described per history of present illness  EKGs/Labs/Other Studies Reviewed:         Recent Labs: 03/14/2024: ALT 8; BUN 24; Creatinine, Ser 1.26; Hemoglobin 16.4; Platelets 194; Potassium 4.9; Sodium 140  Recent Lipid Panel    Component Value Date/Time   CHOL 159 03/14/2024 0834   TRIG 225 (H) 03/14/2024 0834   HDL 38 (L) 03/14/2024 0834   CHOLHDL 4.2 03/14/2024 0834   LDLCALC 83 03/14/2024 0834    Physical Exam:    VS:  BP 130/70   Pulse 61   Ht 5' 11 (1.803 m)   Wt 227 lb (103 kg)   SpO2 95%   BMI 31.66 kg/m     Wt Readings from Last 3 Encounters:  08/28/24 227 lb (103 kg)  07/28/24 227 lb 12.8 oz (103.3 kg)  06/30/24 239 lb 12.8 oz (108.8 kg)     GEN:  Well nourished, well developed in no acute distress HEENT: Normal NECK: No JVD; No carotid bruits LYMPHATICS: No lymphadenopathy CARDIAC: RRR, systolic murmur best heard at left border sternal 1-2/6, no rubs, no gallops RESPIRATORY:  Clear to auscultation without rales, wheezing or rhonchi  ABDOMEN: Soft, non-tender, non-distended MUSCULOSKELETAL:  No edema; No deformity  SKIN: Warm and dry LOWER EXTREMITIES: no swelling NEUROLOGIC:  Alert and oriented x 3 PSYCHIATRIC:  Normal affect   ASSESSMENT:    1. Essential hypertension, benign   2. Ventricular ectopy   3. Chronic bronchitis, unspecified chronic bronchitis type (HCC)   4. Prediabetes    PLAN:    In order of problems listed above:  Essential hypertension blood pressure well-controlled continue present management. Dyslipidemia he is on statin now his  last blood test I see is from August of last year with LDL of 83 HDL 27.  Will recheck his fasting lipid profile. COPD.  Noted. Prediabetes followed by internal medicine team.  Last hemoglobin A1c 5.8. Heart murmur and enlargement of the ascending artery will  do echocardiogram to reassess   Medication Adjustments/Labs and Tests Ordered: Current medicines are reviewed at length with the patient today.  Concerns regarding medicines are outlined above.  Orders Placed This Encounter  Procedures   EKG 12-Lead   Medication changes: No orders of the defined types were placed in this encounter.   Signed, Lamar DOROTHA Fitch, MD, [facility] 08/28/2024 1:58 PM    [facility]    [1]  Current Meds  Medication Sig   albuterol  (VENTOLIN  HFA) 108 (90 Base) MCG/ACT inhaler Inhale 2 puffs into the lungs every 6 (six) hours as needed for wheezing or shortness of breath.   cetirizine  (ZYRTEC ) 10 MG tablet Take 1 tablet (10 mg total) by mouth daily.   clotrimazole -betamethasone  (LOTRISONE ) cream Apply 1 Application topically 2 (two) times daily.   colchicine  0.6 MG tablet Take 1 tablet (0.6 mg total) by mouth daily.   Empagliflozin-metFORMIN  HCl ER 12.12-998 MG TB24 Take 1 tablet by mouth daily.   ezetimibe  (ZETIA ) 10 MG tablet Take 10 mg by mouth daily.   fluticasone  (FLONASE ) 50 MCG/ACT nasal spray Place 2 sprays into both nostrils daily.   fluticasone -salmeterol (WIXELA INHUB) 250-50 MCG/ACT AEPB Inhale 1 puff into the lungs in the morning and at bedtime.   gemfibrozil (LOPID) 600 MG tablet Take 600 mg by mouth 2 (two) times daily before a meal.   HYDROcodone  bit-homatropine (HYDROMET) 5-1.5 MG/5ML syrup Take 5 mLs by mouth every 6 (six) hours as needed for cough.   losartan  (COZAAR ) 100 MG tablet Take 1 tablet (100 mg total) by mouth daily.   metoprolol  tartrate (LOPRESSOR ) 25 MG tablet TAKE 1 TABLET BY MOUTH TWICE A DAY   Multiple Vitamins-Minerals (PRESERVISION AREDS 2 PO) Take  by mouth.   naproxen sodium (ANAPROX) 550 MG tablet Take 550 mg by mouth 2 (two) times daily.   omeprazole (PRILOSEC) 20 MG capsule Take 20 mg by mouth daily.   rosuvastatin (CRESTOR) 40 MG tablet Take 40 mg by mouth daily.   tadalafil (CIALIS) 10 MG tablet Take 10 mg by mouth daily as needed for erectile dysfunction.   Vibegron 75 MG TABS Take 75 mg by mouth daily.   "

## 2024-08-28 NOTE — Patient Instructions (Addendum)
 Medication Instructions:  Your physician recommends that you continue on your current medications as directed. Please refer to the Current Medication list given to you today.  *If you need a refill on your cardiac medications before your next appointment, please call your pharmacy*   Lab Work: None Ordered If you have labs (blood work) drawn today and your tests are completely normal, you will receive your results only by: MyChart Message (if you have MyChart) OR A paper copy in the mail If you have any lab test that is abnormal or we need to change your treatment, we will call you to review the results.   Testing/Procedures: Your physician has requested that you have an echocardiogram. Echocardiography is a painless test that uses sound waves to create images of your heart. It provides your doctor with information about the size and shape of your heart and how well your heart's chambers and valves are working. This procedure takes approximately one hour. There are no restrictions for this procedure. Please do NOT wear cologne, perfume, aftershave, or lotions (deodorant is allowed). Please arrive 15 minutes prior to your appointment time.  Please note: We ask at that you not bring children with you during ultrasound (echo/ vascular) testing. Due to room size and safety concerns, children are not allowed in the ultrasound rooms during exams. Our front office staff cannot provide observation of children in our lobby area while testing is being conducted. An adult accompanying a patient to their appointment will only be allowed in the ultrasound room at the discretion of the ultrasound technician under special circumstances. We apologize for any inconvenience.    Follow-Up: At [facility], you and your health needs are our priority.  As part of our continuing mission to provide you with exceptional heart care, we have created designated Provider Care Teams.  These Care Teams include your  primary Cardiologist (physician) and Advanced Practice Providers (APPs -  Physician Assistants and Nurse Practitioners) who all work together to provide you with the care you need, when you need it.  We recommend signing up for the patient portal called MyChart.  Sign up information is provided on this After Visit Summary.  MyChart is used to connect with patients for Virtual Visits (Telemedicine).  Patients are able to view lab/test results, encounter notes, upcoming appointments, etc.  Non-urgent messages can be sent to your provider as well.   To learn more about what you can do with MyChart, go to [email or web address].    Your next appointment:   12 month(s)  The format for your next appointment:   In Person  Provider:   Lamar Fitch, MD    Other Instructions NA

## 2024-09-25 ENCOUNTER — Ambulatory Visit

## 2024-10-01 ENCOUNTER — Ambulatory Visit: Admitting: Family Medicine
